# Patient Record
Sex: Female | Born: 1965 | Race: Black or African American | Hispanic: No | Marital: Married | Smoking: Former smoker
Health system: Southern US, Community
[De-identification: ages and names within clinical notes are randomized; demographics above are authoritative.]

## PROBLEM LIST (undated history)

## (undated) DIAGNOSIS — D869 Sarcoidosis, unspecified: Secondary | ICD-10-CM

## (undated) DIAGNOSIS — E119 Type 2 diabetes mellitus without complications: Secondary | ICD-10-CM

## (undated) DIAGNOSIS — I10 Essential (primary) hypertension: Secondary | ICD-10-CM

## (undated) DIAGNOSIS — G4730 Sleep apnea, unspecified: Secondary | ICD-10-CM

## (undated) DIAGNOSIS — K219 Gastro-esophageal reflux disease without esophagitis: Secondary | ICD-10-CM

## (undated) DIAGNOSIS — M1990 Unspecified osteoarthritis, unspecified site: Secondary | ICD-10-CM

## (undated) HISTORY — PX: CHOLECYSTECTOMY: SHX55

## (undated) HISTORY — PX: ORIF ANKLE FRACTURE: SUR919

---
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
## 1991-12-27 HISTORY — PX: ABLATION: SHX5711

## 1999-06-19 ENCOUNTER — Ambulatory Visit (HOSPITAL_COMMUNITY): Admission: RE | Admit: 1999-06-19 | Discharge: 1999-06-19 | Payer: Self-pay | Admitting: Internal Medicine

## 1999-06-19 ENCOUNTER — Encounter: Payer: Self-pay | Admitting: Internal Medicine

## 2001-04-16 ENCOUNTER — Other Ambulatory Visit: Admission: RE | Admit: 2001-04-16 | Discharge: 2001-04-16 | Payer: Self-pay | Admitting: Internal Medicine

## 2001-04-18 ENCOUNTER — Encounter: Payer: Self-pay | Admitting: Internal Medicine

## 2001-04-18 ENCOUNTER — Ambulatory Visit (HOSPITAL_COMMUNITY): Admission: RE | Admit: 2001-04-18 | Discharge: 2001-04-18 | Payer: Self-pay | Admitting: Internal Medicine

## 2001-05-28 ENCOUNTER — Encounter: Payer: Self-pay | Admitting: Emergency Medicine

## 2001-05-29 ENCOUNTER — Encounter: Payer: Self-pay | Admitting: Emergency Medicine

## 2001-05-29 ENCOUNTER — Emergency Department (HOSPITAL_COMMUNITY): Admission: EM | Admit: 2001-05-29 | Discharge: 2001-05-29 | Payer: Self-pay | Admitting: Emergency Medicine

## 2001-08-02 ENCOUNTER — Ambulatory Visit (HOSPITAL_COMMUNITY): Admission: RE | Admit: 2001-08-02 | Discharge: 2001-08-02 | Payer: Self-pay | Admitting: Pulmonary Disease

## 2001-08-10 ENCOUNTER — Ambulatory Visit (HOSPITAL_COMMUNITY): Admission: RE | Admit: 2001-08-10 | Discharge: 2001-08-10 | Payer: Self-pay | Admitting: General Surgery

## 2002-05-04 ENCOUNTER — Emergency Department (HOSPITAL_COMMUNITY): Admission: EM | Admit: 2002-05-04 | Discharge: 2002-05-05 | Payer: Self-pay | Admitting: *Deleted

## 2002-05-13 ENCOUNTER — Ambulatory Visit (HOSPITAL_COMMUNITY): Admission: RE | Admit: 2002-05-13 | Discharge: 2002-05-13 | Payer: Self-pay | Admitting: Pulmonary Disease

## 2002-06-01 IMAGING — CT CT ABDOMEN W/O CM
1 series · 16 of 32 positions shown, 20 images · non-contrast
Comparison: none

FINDINGS
CLINICAL DATA: RIGHT SIDED FLANK PAIN.
CT ABDOMEN WITHOUT CONTRAST
MULTIDETECTOR HELICAL CT IS PERFORMED THROUGH THE ABDOMEN AND PELVIS WITHOUT ORAL OR IV CONTRAST.
THE PATIENT IS STATUS POST CHOLECYSTECTOMY.  VISUALIZED LIVER, SPLEEN AND PANCREAS ARE UNREMARKABLE
ON THIS UNINFUSED SCAN.  ADRENAL GLANDS AND KIDNEYS ARE UNREMARKABLE.  NO EVIDENCE OF
HYDRONEPHROSIS OR STONE.  THE APPENDIX IS VISUALIZED, AIR-FILLED, AND IS NORMAL.  THERE IS AN
UMBILICAL HERNIA CONTAINING FAT NOTED.
IMPRESSION
1.  NO ACUTE ABNORMALITY IN THE ABDOMEN.  NO EVIDENCE OF HYDRONEPHROSIS OR RENAL/URETERAL STONE.
2.  STATUS POST CHOLECYSTECTOMY.
CT PELVIS WITHOUT CONTRAST
THE RIGHT OVARY IS PROMINENT, POSSIBLY CONTAINING A CYST.  THE UTERUS AND LEFT ADNEXA ARE
UNREMARKABLE.  NO FREE FLUID, FREE AIR OR ADENOPATHY.  NO EVIDENCE OF URETERAL STONE OR DILATATION.
PROMINENT RIGHT OVARY, POSSIBLY CONTAINING A CYST.  NO EVIDENCE OF URETERAL CALCULI.

[Series 4369: — · axial · 0.98mm/px · z∈[+1508,+1858]mm · 16 of 79 slices shown, 20 images]
[im 6/79  soft-tissue]
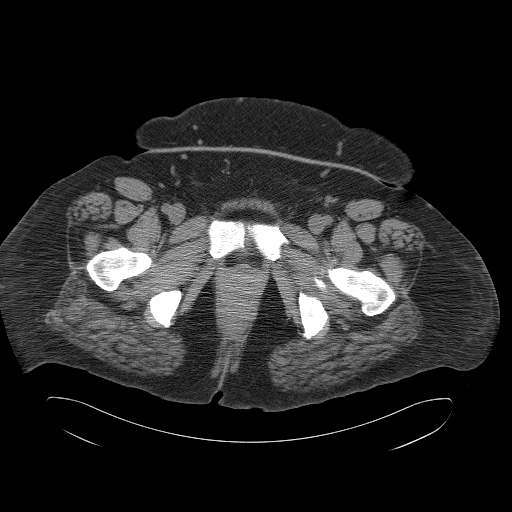
[im 6/79  bone]
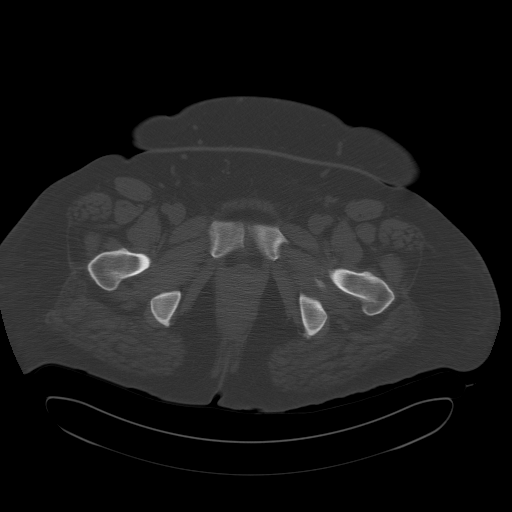
[im 11/79  soft-tissue]
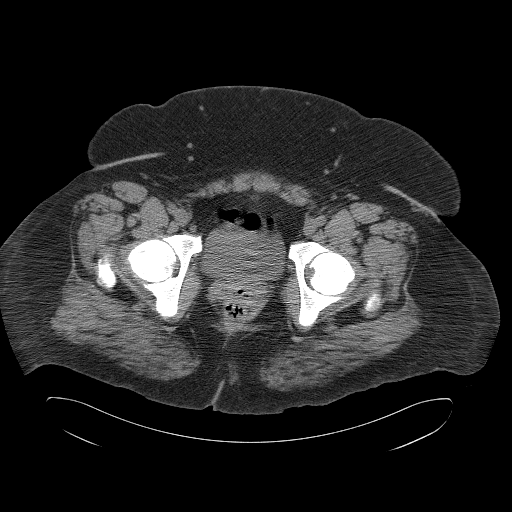
[im 16/79  soft-tissue]
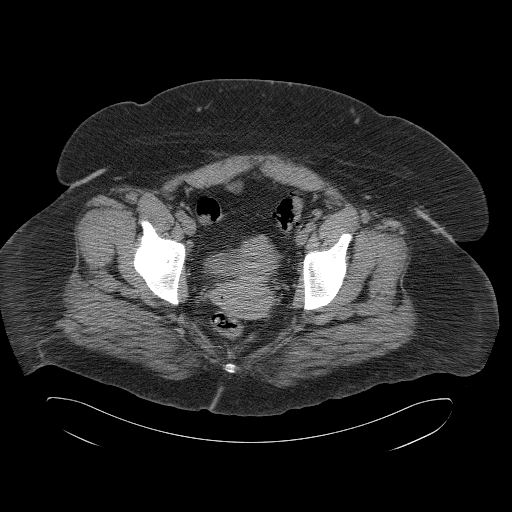
[im 21/79  soft-tissue]
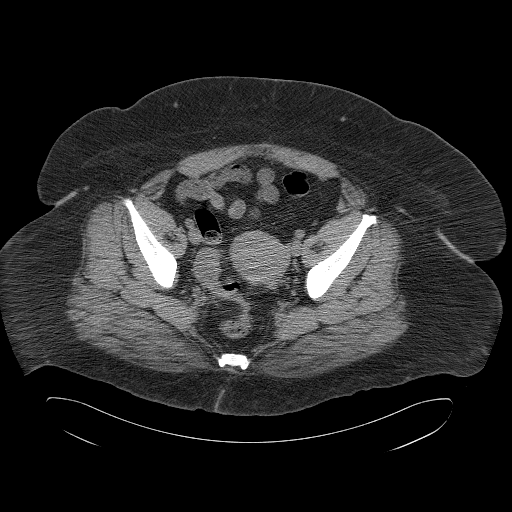
[im 26/79  soft-tissue]
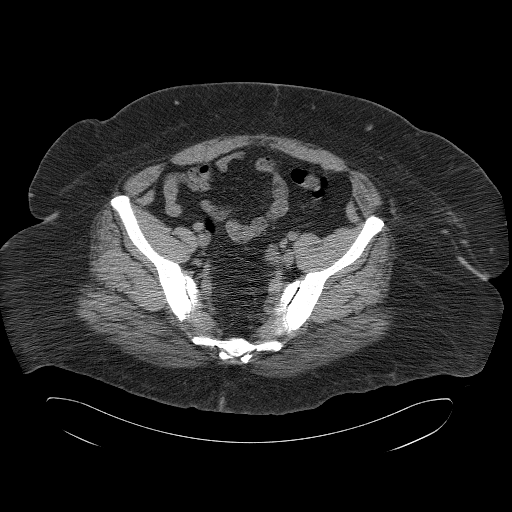
[im 31/79  soft-tissue]
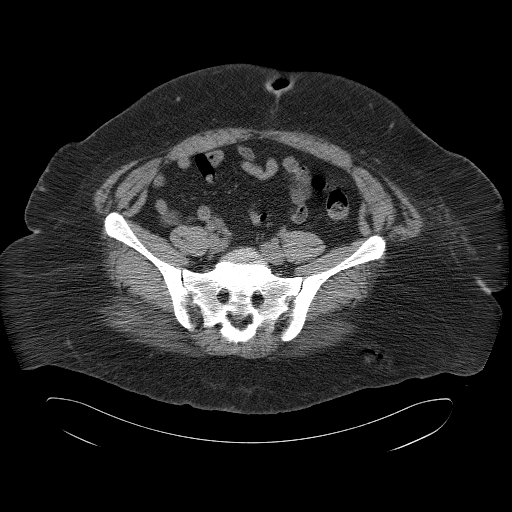
[im 36/79  soft-tissue]
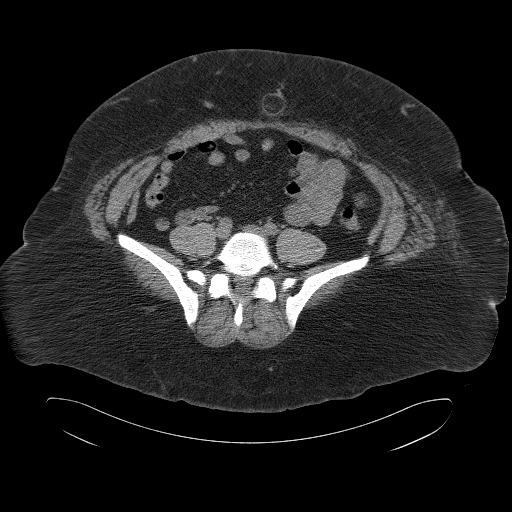
[im 43/79  soft-tissue]
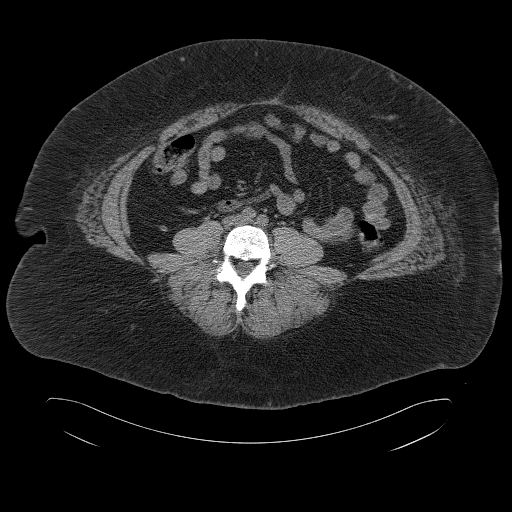
[im 48/79  soft-tissue]
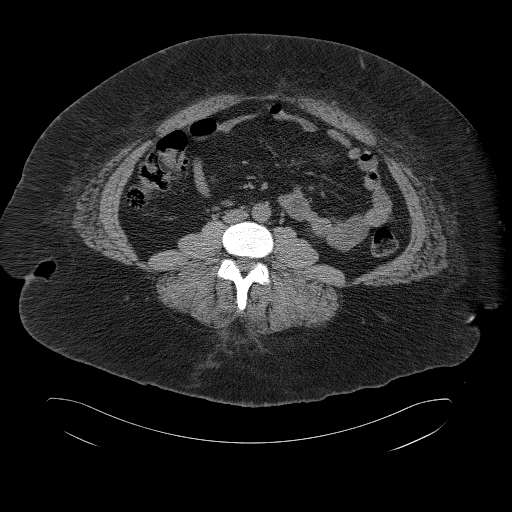
[im 48/79  bone]
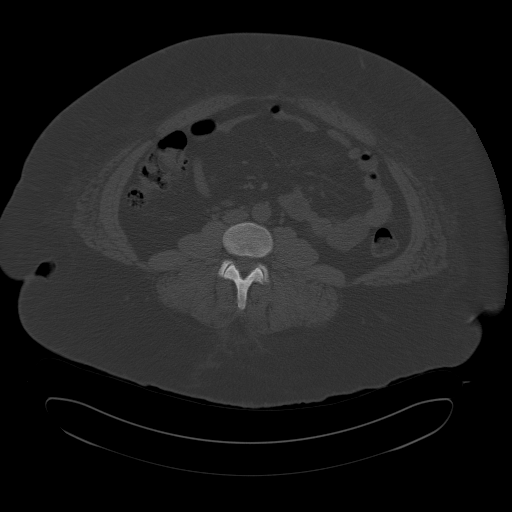
[im 53/79  soft-tissue]
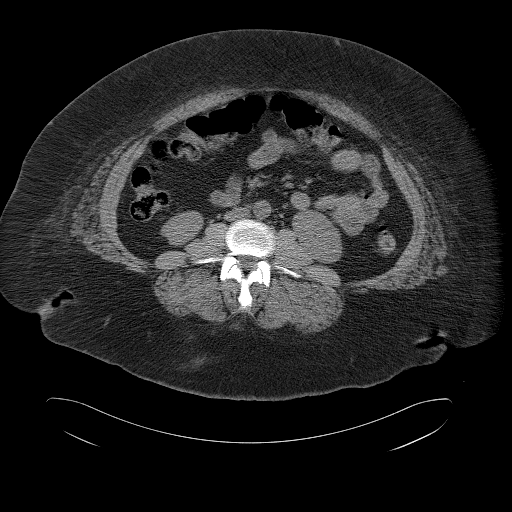
[im 58/79  soft-tissue]
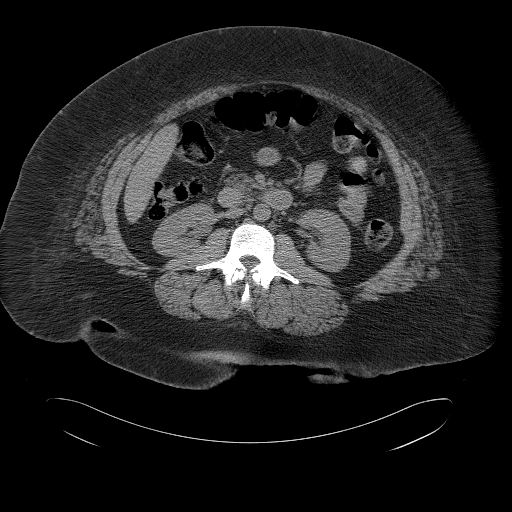
[im 63/79  soft-tissue]
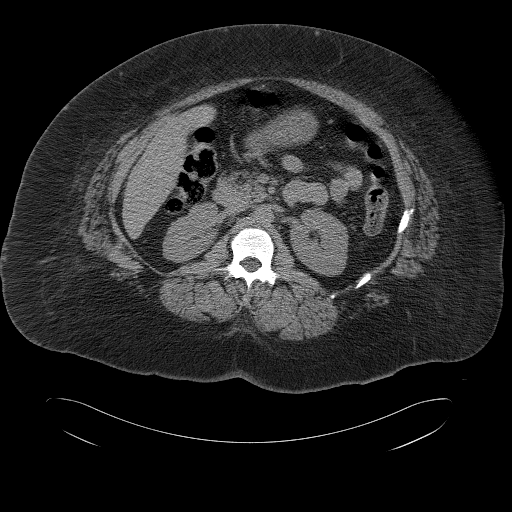
[im 68/79  soft-tissue]
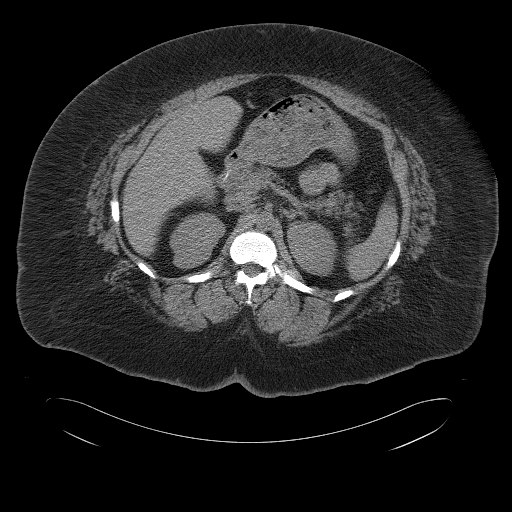
[im 68/79  lung]
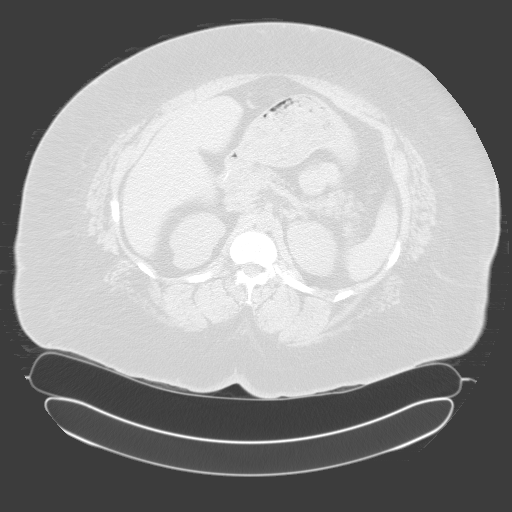
[im 71/79  lung]
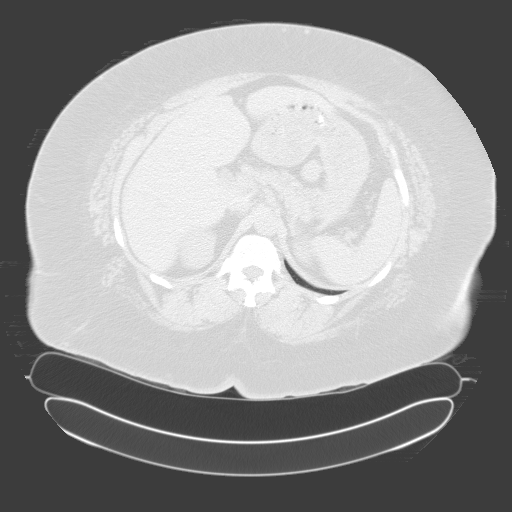
[im 73/79  soft-tissue]
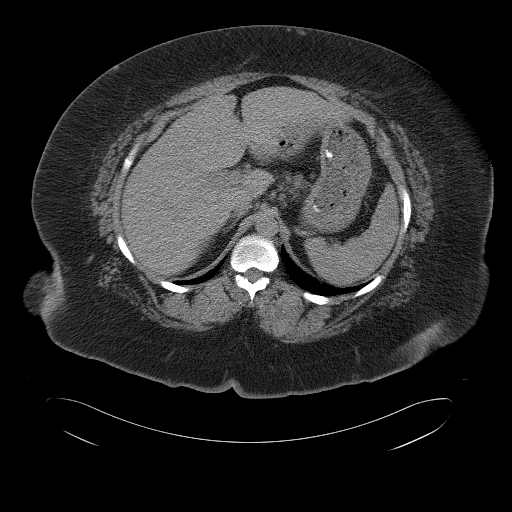
[im 73/79  lung]
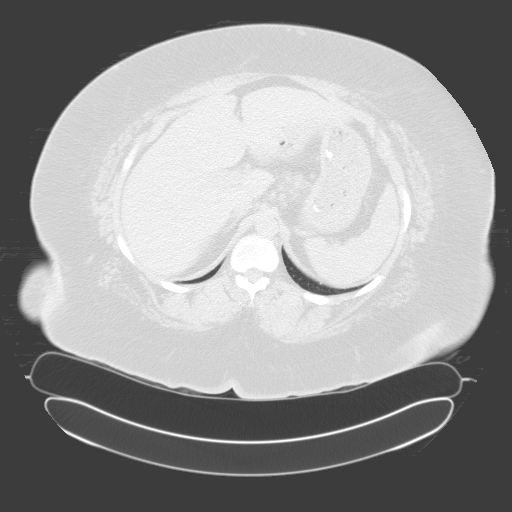
[im 76/79  lung]
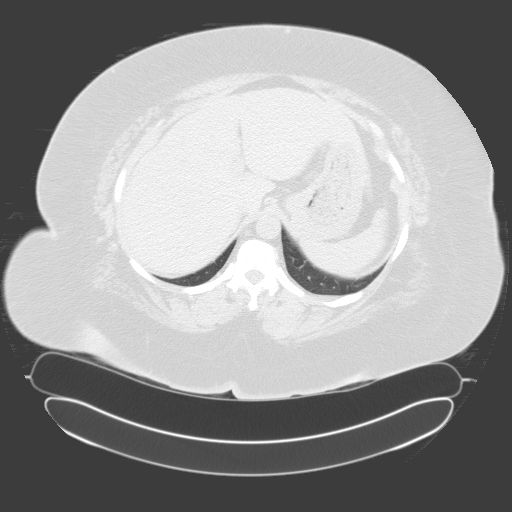

[16 of 32 positions shown; findings below may reference images not displayed]

## 2002-06-20 ENCOUNTER — Ambulatory Visit (HOSPITAL_COMMUNITY): Admission: RE | Admit: 2002-06-20 | Discharge: 2002-06-20 | Payer: Self-pay | Admitting: Internal Medicine

## 2002-06-20 ENCOUNTER — Encounter (INDEPENDENT_AMBULATORY_CARE_PROVIDER_SITE_OTHER): Payer: Self-pay | Admitting: Internal Medicine

## 2003-04-03 ENCOUNTER — Ambulatory Visit (HOSPITAL_COMMUNITY): Admission: RE | Admit: 2003-04-03 | Discharge: 2003-04-03 | Payer: Self-pay | Admitting: Pulmonary Disease

## 2004-06-24 ENCOUNTER — Ambulatory Visit (HOSPITAL_COMMUNITY): Admission: RE | Admit: 2004-06-24 | Discharge: 2004-06-24 | Payer: Self-pay | Admitting: Obstetrics & Gynecology

## 2004-06-24 ENCOUNTER — Encounter (INDEPENDENT_AMBULATORY_CARE_PROVIDER_SITE_OTHER): Payer: Self-pay | Admitting: Internal Medicine

## 2004-07-26 ENCOUNTER — Emergency Department (HOSPITAL_COMMUNITY): Admission: AC | Admit: 2004-07-26 | Discharge: 2004-07-26 | Payer: Self-pay

## 2005-05-24 ENCOUNTER — Ambulatory Visit (HOSPITAL_COMMUNITY): Admission: RE | Admit: 2005-05-24 | Discharge: 2005-05-24 | Payer: Self-pay | Admitting: Pulmonary Disease

## 2005-09-06 ENCOUNTER — Ambulatory Visit: Payer: Self-pay | Admitting: Family Medicine

## 2005-09-07 LAB — CONVERTED CEMR LAB
LDL Cholesterol: 121 mg/dL
TSH: 2.611 microintl units/mL

## 2005-09-08 ENCOUNTER — Encounter (INDEPENDENT_AMBULATORY_CARE_PROVIDER_SITE_OTHER): Payer: Self-pay | Admitting: Internal Medicine

## 2005-10-07 ENCOUNTER — Ambulatory Visit: Payer: Self-pay | Admitting: Family Medicine

## 2005-10-10 ENCOUNTER — Ambulatory Visit: Payer: Self-pay | Admitting: Internal Medicine

## 2005-10-14 ENCOUNTER — Encounter (INDEPENDENT_AMBULATORY_CARE_PROVIDER_SITE_OTHER): Payer: Self-pay | Admitting: Internal Medicine

## 2005-10-24 ENCOUNTER — Ambulatory Visit: Admission: RE | Admit: 2005-10-24 | Discharge: 2005-10-24 | Payer: Self-pay | Admitting: Internal Medicine

## 2005-10-24 ENCOUNTER — Encounter (INDEPENDENT_AMBULATORY_CARE_PROVIDER_SITE_OTHER): Payer: Self-pay | Admitting: Internal Medicine

## 2005-10-26 ENCOUNTER — Ambulatory Visit: Payer: Self-pay | Admitting: Pulmonary Disease

## 2005-11-03 ENCOUNTER — Encounter (INDEPENDENT_AMBULATORY_CARE_PROVIDER_SITE_OTHER): Payer: Self-pay | Admitting: Internal Medicine

## 2005-11-13 ENCOUNTER — Encounter (INDEPENDENT_AMBULATORY_CARE_PROVIDER_SITE_OTHER): Payer: Self-pay | Admitting: Internal Medicine

## 2005-11-24 ENCOUNTER — Ambulatory Visit: Payer: Self-pay | Admitting: Internal Medicine

## 2006-04-12 ENCOUNTER — Ambulatory Visit: Payer: Self-pay | Admitting: Internal Medicine

## 2006-04-14 ENCOUNTER — Ambulatory Visit (HOSPITAL_COMMUNITY): Admission: RE | Admit: 2006-04-14 | Discharge: 2006-04-14 | Payer: Self-pay | Admitting: Internal Medicine

## 2006-04-14 ENCOUNTER — Encounter (INDEPENDENT_AMBULATORY_CARE_PROVIDER_SITE_OTHER): Payer: Self-pay | Admitting: Internal Medicine

## 2006-04-20 ENCOUNTER — Telehealth (INDEPENDENT_AMBULATORY_CARE_PROVIDER_SITE_OTHER): Payer: Self-pay | Admitting: Internal Medicine

## 2006-05-03 ENCOUNTER — Ambulatory Visit: Payer: Self-pay | Admitting: Orthopedic Surgery

## 2006-05-05 ENCOUNTER — Encounter (INDEPENDENT_AMBULATORY_CARE_PROVIDER_SITE_OTHER): Payer: Self-pay | Admitting: Internal Medicine

## 2006-05-15 ENCOUNTER — Ambulatory Visit: Payer: Self-pay | Admitting: Internal Medicine

## 2006-05-15 ENCOUNTER — Other Ambulatory Visit: Admission: RE | Admit: 2006-05-15 | Discharge: 2006-05-15 | Payer: Self-pay | Admitting: Internal Medicine

## 2006-05-15 LAB — CONVERTED CEMR LAB: Pap Smear: NORMAL

## 2006-05-23 ENCOUNTER — Encounter (INDEPENDENT_AMBULATORY_CARE_PROVIDER_SITE_OTHER): Payer: Self-pay | Admitting: Internal Medicine

## 2006-06-12 ENCOUNTER — Ambulatory Visit: Payer: Self-pay | Admitting: Internal Medicine

## 2006-06-15 ENCOUNTER — Encounter (INDEPENDENT_AMBULATORY_CARE_PROVIDER_SITE_OTHER): Payer: Self-pay | Admitting: Internal Medicine

## 2006-06-15 ENCOUNTER — Ambulatory Visit (HOSPITAL_COMMUNITY): Admission: RE | Admit: 2006-06-15 | Discharge: 2006-06-15 | Payer: Self-pay | Admitting: Internal Medicine

## 2006-06-16 ENCOUNTER — Encounter (INDEPENDENT_AMBULATORY_CARE_PROVIDER_SITE_OTHER): Payer: Self-pay | Admitting: Internal Medicine

## 2006-06-23 ENCOUNTER — Encounter (INDEPENDENT_AMBULATORY_CARE_PROVIDER_SITE_OTHER): Payer: Self-pay | Admitting: Internal Medicine

## 2006-08-25 ENCOUNTER — Telehealth (INDEPENDENT_AMBULATORY_CARE_PROVIDER_SITE_OTHER): Payer: Self-pay | Admitting: Internal Medicine

## 2006-10-19 ENCOUNTER — Encounter (INDEPENDENT_AMBULATORY_CARE_PROVIDER_SITE_OTHER): Payer: Self-pay | Admitting: Internal Medicine

## 2006-10-24 ENCOUNTER — Encounter (INDEPENDENT_AMBULATORY_CARE_PROVIDER_SITE_OTHER): Payer: Self-pay | Admitting: Internal Medicine

## 2006-10-31 ENCOUNTER — Ambulatory Visit: Payer: Self-pay | Admitting: Internal Medicine

## 2006-11-24 ENCOUNTER — Encounter (INDEPENDENT_AMBULATORY_CARE_PROVIDER_SITE_OTHER): Payer: Self-pay | Admitting: Internal Medicine

## 2007-01-03 ENCOUNTER — Ambulatory Visit: Payer: Self-pay | Admitting: Internal Medicine

## 2007-01-03 LAB — CONVERTED CEMR LAB
Ketones, ur: NEGATIVE mg/dL
Protein, ur: 30 mg/dL
Specific Gravity, Urine: >=1.03
Urine Glucose: NEGATIVE mg/dL

## 2007-01-29 ENCOUNTER — Encounter: Payer: Self-pay | Admitting: Internal Medicine

## 2007-01-29 DIAGNOSIS — G4733 Obstructive sleep apnea (adult) (pediatric): Secondary | ICD-10-CM | POA: Insufficient documentation

## 2007-01-29 DIAGNOSIS — M1990 Unspecified osteoarthritis, unspecified site: Secondary | ICD-10-CM | POA: Insufficient documentation

## 2007-01-29 DIAGNOSIS — I10 Essential (primary) hypertension: Secondary | ICD-10-CM | POA: Insufficient documentation

## 2007-01-29 DIAGNOSIS — D869 Sarcoidosis, unspecified: Secondary | ICD-10-CM | POA: Insufficient documentation

## 2007-01-29 DIAGNOSIS — G43909 Migraine, unspecified, not intractable, without status migrainosus: Secondary | ICD-10-CM | POA: Insufficient documentation

## 2007-04-05 ENCOUNTER — Ambulatory Visit (HOSPITAL_COMMUNITY): Admission: RE | Admit: 2007-04-05 | Discharge: 2007-04-05 | Payer: Self-pay | Admitting: Internal Medicine

## 2007-04-05 ENCOUNTER — Ambulatory Visit: Payer: Self-pay | Admitting: Internal Medicine

## 2007-04-05 DIAGNOSIS — J029 Acute pharyngitis, unspecified: Secondary | ICD-10-CM | POA: Insufficient documentation

## 2007-04-05 DIAGNOSIS — D7289 Other specified disorders of white blood cells: Secondary | ICD-10-CM | POA: Insufficient documentation

## 2007-04-05 LAB — CONVERTED CEMR LAB
Basophils Absolute: 0 10*3/uL (ref 0.0–0.1)
Creatinine, Ser: 0.86 mg/dL (ref 0.40–1.20)
Lymphs Abs: 2.1 10*3/uL (ref 0.7–3.3)
MCHC: 33.8 g/dL (ref 30.0–36.0)
MCV: 80.1 fL (ref 78.0–100.0)
Monocytes Absolute: 1.2 10*3/uL — ABNORMAL HIGH (ref 0.2–0.7)
Monocytes Relative: 7 % (ref 3–11)
Neutrophils Relative %: 80 % — ABNORMAL HIGH (ref 43–77)
RDW: 15.5 % — ABNORMAL HIGH (ref 11.5–14.0)
WBC: 17.2 10*3/uL — ABNORMAL HIGH (ref 4.0–10.5)

## 2007-04-06 ENCOUNTER — Encounter (INDEPENDENT_AMBULATORY_CARE_PROVIDER_SITE_OTHER): Payer: Self-pay | Admitting: Internal Medicine

## 2007-10-05 ENCOUNTER — Ambulatory Visit: Payer: Self-pay | Admitting: Internal Medicine

## 2007-10-05 DIAGNOSIS — M79609 Pain in unspecified limb: Secondary | ICD-10-CM | POA: Insufficient documentation

## 2007-10-08 LAB — CONVERTED CEMR LAB
BUN: 15 mg/dL (ref 6–23)
CO2: 23 meq/L (ref 19–32)
Calcium: 9.4 mg/dL (ref 8.4–10.5)
Cholesterol: 176 mg/dL (ref 0–200)
Glucose, Bld: 119 mg/dL — ABNORMAL HIGH (ref 70–99)
Sodium: 141 meq/L (ref 135–145)
Total CHOL/HDL Ratio: 4
VLDL: 36 mg/dL (ref 0–40)

## 2007-11-28 ENCOUNTER — Telehealth (INDEPENDENT_AMBULATORY_CARE_PROVIDER_SITE_OTHER): Payer: Self-pay | Admitting: *Deleted

## 2007-12-27 ENCOUNTER — Encounter: Payer: Self-pay | Admitting: Family Medicine

## 2008-01-18 ENCOUNTER — Ambulatory Visit: Payer: Self-pay | Admitting: Internal Medicine

## 2008-04-23 ENCOUNTER — Ambulatory Visit (HOSPITAL_COMMUNITY): Admission: RE | Admit: 2008-04-23 | Discharge: 2008-04-23 | Payer: Self-pay | Admitting: Internal Medicine

## 2008-04-23 ENCOUNTER — Ambulatory Visit: Payer: Self-pay | Admitting: Internal Medicine

## 2008-05-15 ENCOUNTER — Ambulatory Visit: Payer: Self-pay | Admitting: Internal Medicine

## 2008-05-15 DIAGNOSIS — J309 Allergic rhinitis, unspecified: Secondary | ICD-10-CM | POA: Insufficient documentation

## 2008-07-07 ENCOUNTER — Telehealth (INDEPENDENT_AMBULATORY_CARE_PROVIDER_SITE_OTHER): Payer: Self-pay | Admitting: Internal Medicine

## 2008-08-13 ENCOUNTER — Ambulatory Visit: Payer: Self-pay | Admitting: Internal Medicine

## 2008-08-13 DIAGNOSIS — F4321 Adjustment disorder with depressed mood: Secondary | ICD-10-CM | POA: Insufficient documentation

## 2008-08-15 ENCOUNTER — Encounter (INDEPENDENT_AMBULATORY_CARE_PROVIDER_SITE_OTHER): Payer: Self-pay | Admitting: Internal Medicine

## 2008-08-21 ENCOUNTER — Ambulatory Visit: Payer: Self-pay | Admitting: Orthopedic Surgery

## 2008-08-21 DIAGNOSIS — M19079 Primary osteoarthritis, unspecified ankle and foot: Secondary | ICD-10-CM | POA: Insufficient documentation

## 2008-12-09 ENCOUNTER — Telehealth (INDEPENDENT_AMBULATORY_CARE_PROVIDER_SITE_OTHER): Payer: Self-pay | Admitting: Internal Medicine

## 2008-12-16 ENCOUNTER — Ambulatory Visit: Payer: Self-pay | Admitting: Internal Medicine

## 2008-12-24 ENCOUNTER — Encounter (INDEPENDENT_AMBULATORY_CARE_PROVIDER_SITE_OTHER): Payer: Self-pay | Admitting: Internal Medicine

## 2009-07-07 ENCOUNTER — Ambulatory Visit: Payer: Self-pay | Admitting: Internal Medicine

## 2009-07-08 ENCOUNTER — Encounter (INDEPENDENT_AMBULATORY_CARE_PROVIDER_SITE_OTHER): Payer: Self-pay | Admitting: Internal Medicine

## 2009-07-08 LAB — CONVERTED CEMR LAB
ALT: 27 units/L (ref 0–35)
AST: 26 units/L (ref 0–37)
Albumin: 4.2 g/dL (ref 3.5–5.2)
Calcium: 9.2 mg/dL (ref 8.4–10.5)
Chloride: 100 meq/L (ref 96–112)
Cholesterol: 192 mg/dL (ref 0–200)
HDL: 49 mg/dL (ref >39)
MCHC: 33.4 g/dL (ref 30.0–36.0)
MCV: 78.5 fL (ref 78.0–100.0)
Monocytes Relative: 6 % (ref 3–12)
Potassium: 4.6 meq/L (ref 3.5–5.3)
Total Bilirubin: 0.4 mg/dL (ref 0.3–1.2)
Total CHOL/HDL Ratio: 3.9
Triglycerides: 177 mg/dL — ABNORMAL HIGH (ref <150)
VLDL: 35 mg/dL (ref 0–40)

## 2009-07-13 ENCOUNTER — Telehealth (INDEPENDENT_AMBULATORY_CARE_PROVIDER_SITE_OTHER): Payer: Self-pay | Admitting: *Deleted

## 2009-07-20 ENCOUNTER — Encounter (INDEPENDENT_AMBULATORY_CARE_PROVIDER_SITE_OTHER): Payer: Self-pay | Admitting: Internal Medicine

## 2009-07-21 ENCOUNTER — Encounter (INDEPENDENT_AMBULATORY_CARE_PROVIDER_SITE_OTHER): Payer: Self-pay | Admitting: Internal Medicine

## 2009-07-22 ENCOUNTER — Ambulatory Visit: Payer: Self-pay | Admitting: Internal Medicine

## 2009-07-22 DIAGNOSIS — IMO0001 Reserved for inherently not codable concepts without codable children: Secondary | ICD-10-CM | POA: Insufficient documentation

## 2009-07-22 DIAGNOSIS — E1165 Type 2 diabetes mellitus with hyperglycemia: Secondary | ICD-10-CM

## 2009-07-22 LAB — CONVERTED CEMR LAB
Blood Glucose, Fasting: 193 mg/dL
Hgb A1c MFr Bld: 8.6 %

## 2009-08-04 ENCOUNTER — Encounter (INDEPENDENT_AMBULATORY_CARE_PROVIDER_SITE_OTHER): Payer: Self-pay | Admitting: Internal Medicine

## 2009-08-07 ENCOUNTER — Encounter (INDEPENDENT_AMBULATORY_CARE_PROVIDER_SITE_OTHER): Payer: Self-pay | Admitting: Internal Medicine

## 2009-08-18 ENCOUNTER — Ambulatory Visit: Payer: Self-pay | Admitting: Internal Medicine

## 2009-08-19 ENCOUNTER — Encounter (INDEPENDENT_AMBULATORY_CARE_PROVIDER_SITE_OTHER): Payer: Self-pay | Admitting: Internal Medicine

## 2009-08-26 ENCOUNTER — Ambulatory Visit: Payer: Self-pay | Admitting: Internal Medicine

## 2009-09-03 ENCOUNTER — Telehealth (INDEPENDENT_AMBULATORY_CARE_PROVIDER_SITE_OTHER): Payer: Self-pay | Admitting: Internal Medicine

## 2009-09-11 ENCOUNTER — Ambulatory Visit: Payer: Self-pay | Admitting: Internal Medicine

## 2009-09-12 ENCOUNTER — Encounter (INDEPENDENT_AMBULATORY_CARE_PROVIDER_SITE_OTHER): Payer: Self-pay | Admitting: Internal Medicine

## 2009-09-14 LAB — CONVERTED CEMR LAB
Glucose, Bld: 159 mg/dL — ABNORMAL HIGH (ref 70–99)
Potassium: 4.3 meq/L (ref 3.5–5.3)

## 2009-11-28 ENCOUNTER — Emergency Department (HOSPITAL_COMMUNITY): Admission: EM | Admit: 2009-11-28 | Discharge: 2009-11-28 | Payer: Self-pay | Admitting: Emergency Medicine

## 2011-01-16 ENCOUNTER — Encounter: Payer: Self-pay | Admitting: Preventative Medicine

## 2011-01-16 ENCOUNTER — Encounter: Payer: Self-pay | Admitting: Family Medicine

## 2011-01-27 NOTE — Letter (Signed)
Summary: rpc chart  rpc chart   Imported By: Curtis Sites 10/11/2010 10:17:11  _____________________________________________________________________  External Attachment:    Type:   Image     Comment:   External Document

## 2011-05-13 NOTE — Procedures (Signed)
Terri Logan, Terri Logan             ACCOUNT NO.:  [id number]   MEDICAL RECORD NO.:  [id number]          PATIENT TYPE:  OUT   LOCATION:  SLEEP LAB                     FACILITY:  [facility]   PHYSICIAN:  Marcelyn Bruins, M.D. [facility] DATE OF BIRTH:  Jan 26, 1966   DATE OF STUDY:  10/24/2005                              NOCTURNAL POLYSOMNOGRAM   REFERRING PHYSICIAN:  Dr. Sandrea Hughs.   DATE OF STUDY:  October 24, 2005.   INDICATION FOR STUDY:  Hypersomnia with sleep apnea.   EPWORTH SCORE:  9.   SLEEP ARCHITECTURE:  The patient had total sleep time of 328 minutes with  very little slow wave sleep and decreased REM. Sleep onset latency was  prolonged at 39 minutes and REM onset was normal. Sleep efficiency was 85%.   RESPIRATORY DATA:  The patient was found to have 201 obstructive events in  the first 131 minutes of sleep. This gave her respiratory disturbance index  of 92 events per hour. Events occurred in all body positions and there was  loud snoring noted. By protocol, the patient was then placed on C-PAP with  nasal pillows and ultimately titrated to 14 cm of pressure. The patient  continued to have significant breakthrough on this pressure; however, the  technician did not increase pressure for fear of leaks with the nasal pillow  setup.   OXYGEN DATA:  The patient had O2 desaturation as low as 80% with her  obstructive events.   CARDIAC DATA:  No clinically significant cardiac arrhythmias.   MOVEMENT/PARASOMNIA:  The patient was found to have small to moderate  numbers of leg jerks with very little sleep disruption.   IMPRESSION/RECOMMENDATIONS:  A very severe obstructive sleep apnea/hypopnea  syndrome with a respiratory disturbance index of 92 events during the first  part of the study prior to C-PAP initiation. The patient was placed on C-PAP  and titrated to a level of 14 cm with persistent significant breakthrough.  At this point in time, I  would recommend initiating C-PAP at a  level of 10 cm, and having the patient  return for formal C-PAP titration at no charge to her because the technical  difficulties with the actual C-PAP titration.                                            ______________________________  Marcelyn Bruins, M.D. [facility]  Diplomate, American Board of Sleep  Medicine     KC/MEDQ  D:  10/26/2005 17:05:39  T:  10/26/2005 23:57:43  Job:  [id number]

## 2011-05-13 NOTE — Op Note (Signed)
[facility]  Patient:    Terri Logan, Terri Logan Visit Number: [id number] MRN: [id number]          Service Type: END Location: DAY Attending Physician:  Jonathon Bellows Dictated by:   Roetta Sessions, M.D. Proc. Date: 06/20/02 Admit Date:  06/20/2002 Discharge Date: 06/20/2002   CC:         Kari Baars, M.D.  Arna Snipe, M.D.   Operative Report  PROCEDURE:  Diagnostic esophagogastroduodenoscopy.  GASTROENTEROLOGIST:  Roetta Sessions, M.D.  INDICATIONS: The patient is a pleasant 45 year old lady with a 2-1/36-month history of right upper quadrant abdominal pain.  EGD is being done to further evaluate her symptoms.  This approach has been discussed with [name]. Potential risks, benefits, and alternatives have been reviewed and questions answered.  She is agreeable.  I feel she is low risk for conscious sedation. Please see my dictated consultation note of June 24 for more information.  PROCEDURE NOTE:  O2 saturation, blood pressure, and pulse for this patient were monitored throughout the entire procedure.  CONSCIOUS SEDATION:  IV Versed and Demerol in incremental doses.  INSTRUMENT:  Olympus video chip gastroscope.  FINDINGS:   Examination of the tubular esophagus revealed no mucosal abnormalities.  The EG junction was easily traversed.  STOMACH:  Gastric cavity was emptied and insufflated well with air.  Thorough examination of gastric mucosa including retroflexed view of the proximal stomach and esophagogastric junction demonstrated no abnormalities.  Pylorus was patent and easily traversed.  DUODENUM: Bulb and second portion appeared normal.  THERAPEUTIC/DIAGNOSTIC MANEUVERS PERFORMED:  None.  The patient tolerated the procedure well and was reacted in endoscopy.  IMPRESSION:  Normal esophagus, stomach, and duodenum through the second portion.  No endoscopic explanation for patient symptoms.  Of note from June 18, 2002, her  CBC was normal.  Sed rate was 15.  Liver functions were also normal.  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  Sphincter of Oddi dysfunction is not excluded at this time.  RECOMMENDATIONS:  One-month course of acid suppression with Nexium 40 mg orally daily empirically and Levbid 1 tablet orally bid.  Will have her come back to see [place] back in the office in four weeks to assess her progress.  We will then make further recommendations depending on how she is doing. Dictated by:   Roetta Sessions, M.D. Attending Physician:  Jonathon Bellows DD:  06/20/02 TD:  06/22/02 Job: [id number]

## 2011-05-13 NOTE — Op Note (Signed)
NAME:  Terri Logan, Terri Logan                       ACCOUNT NO.:  [id number]   MEDICAL RECORD NO.:  [id number]                   PATIENT TYPE:  AMB   LOCATION:  DAY                                  FACILITY:  [facility]   PHYSICIAN:  Lazaro Arms, M.D.                DATE OF BIRTH:  Apr 30, 1966   DATE OF PROCEDURE:  06/24/2004  DATE OF DISCHARGE:                                 OPERATIVE REPORT   PREOPERATIVE DIAGNOSES:  1. Menometrorrhagia.  2. Dysmenorrhea.   POSTOPERATIVE DIAGNOSES:  1. Menometrorrhagia.  2. Dysmenorrhea.   PROCEDURES:  1. Hysteroscopy.  2. Dilation and curettage.  3. Endometrial ablation.   SURGEON:  Lazaro Arms, M.D.   ANESTHESIA:  General endotracheal.   FINDINGS:  The patient had a normal endometrium.  There were no polyps, no  other abnormalities seen.   DESCRIPTION OF OPERATION:  The patient was taken to the operating room,  placed in the supine position where she underwent general endotracheal  anesthesia, prepped and draped in the usual sterile fashion after being  placed in the dorsal lithotomy position.  Speculum was placed, cervix was  grasped.  A paracervical block was placed using a total of 20 mL of 0.5%  Marcaine with 1:200,000 epinephrine.  The cervix was dilated serially to  allow passage of the hysteroscope.  Hysteroscopy found normal endometrial  cavity, no polyps, no abnormalities.  A vigorous uterine curettage was  performed.  A large amount of tissue was returned.  Endometrial ablation was  then performed.  It required 36 mL of D5W to maintain a pressure of  approximately 190-195 mmHg throughout the procedure.  It was heated to 88  degrees C for a total therapy time of 9 minutes, 55 seconds.  The patient  tolerated the procedure well.  She experienced minimal blood loss.  Taken to  the recovery room in good stable condition, all counts were correct.      ___________________________________________        Lazaro Arms, M.D.   LHE/MEDQ  D:  06/24/2004  T:  06/24/2004  Job:  [id number]

## 2011-05-13 NOTE — Procedures (Signed)
NAME:  Terri Logan, Terri Logan NO.:  [id number]   MEDICAL RECORD NO.:  [id number]           PATIENT TYPE:   LOCATION:  SLEEP LAB                      FACILITY:   PHYSICIAN:  Marcelyn Bruins, M.D. [facility]      DATE OF BIRTH:   DATE OF STUDY:                              NOCTURNAL POLYSOMNOGRAM   REFERRING PHYSICIAN:  Dr. Sandrea Hughs.   INDICATION FOR STUDY:  November 13, 2005.   INDICATION FOR STUDY:  Hypersomnia with sleep apnea.   EPWORTH SCORE:  4.   SLEEP ARCHITECTURE:  The patient had a total sleep time of 418 minutes with  adequate slow wave sleep and increased REM. Sleep onset latency was normal  as was REM onset. Sleep efficiency was fairly good at 91%.   RESPIRATORY DATA:  The patient underwent a C-PAP titration for her  previously documented severe obstructive sleep apnea. The patient was placed  on large nasal pillows and ultimately titrated to 9 cm of water pressure  with excellent control of her obstructive events even through REM. However,  the patient did not achieve supine REM.   OXYGEN DATA:  There was minimal O2 desaturation transiently to 87%.   CARDIAC DATA:  Rare PVC was noted, however, no clinically significant  cardiac arrhythmia.   MOVEMENT/PARASOMNIA:  There were 95 leg jerks with one per hour resulting in  arousal or awakening. There was no abnormal behavior noted during the study.   IMPRESSION/RECOMMENDATIONS:  1.  Good control of previously documented obstructive sleep apnea with 9 cm      of water pressure via large nasal pillows.  2.  Moderate numbers of leg jerks with very little sleep disruption. I would      not be concerned about this unless the patient continues to be      symptomatic after appropriate treatment of her obstructive sleep apnea.                                            ______________________________  Marcelyn Bruins, M.D. [facility]  Diplomate, American Board of Sleep  Medicine     KC/MEDQ  D:  11/21/2005 11:48:58  T:   11/21/2005 22:21:35  Job:  [id number]

## 2015-03-05 ENCOUNTER — Ambulatory Visit (INDEPENDENT_AMBULATORY_CARE_PROVIDER_SITE_OTHER): Payer: BLUE CROSS/BLUE SHIELD | Admitting: Orthopedic Surgery

## 2015-03-05 VITALS — BP 155/81 | Ht 67.0 in | Wt 317.0 lb

## 2015-03-05 DIAGNOSIS — M654 Radial styloid tenosynovitis [de Quervain]: Secondary | ICD-10-CM

## 2015-03-05 NOTE — Progress Notes (Signed)
Chief Complaint  Patient presents with  . Wrist Pain    7 months left wrist pain referred by Dr, Margo AyeHall.    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 Patient notices increased pain when she tries to lift something with a left wrist when she's picking things up when she is turning her wrist note she does not complain of numbness or tingling  Review of systems recent weight loss night sweats cough ankle leg edema stiff joints back pain joint pain tingling which is probably related to this elastic brace as it occurs over the metacarpals are not over the fingertips and its dorsal rather than volar  She's had a cholecystectomy she had ankle surgery in 2002  She has diabetes and hypertension as well as arthritis  She is on glipizide amlodipine and spironolactone citalopram meloxicam hydrocodone  No allergies  Family history hypertension cancer  Does not smoke or drink  Vital signs are stable marked obesity she is oriented 3 mood is pleasant she has tenderness over the first extensor compartment of the thumb with painful ulnar deviation wrist extension pronation supination but a stable wrist and thumb no motor deficits skin intact pulses normal no sensory deficits epitrochlear lymph nodes are negative  Impression Encounter Diagnosis  Name Primary?  Tommi Rumps. De Quervain's disease (tenosynovitis) Yes    Recommend right now splint, ice, anti-inflammatories, follow-up 2 months

## 2015-03-05 NOTE — Patient Instructions (Addendum)
De Quervain's Tenosynovitis De Quervain's tenosynovitis involves inflammation of one or two tendon linings (sheaths) or strain of one or two tendons to the thumb: extensor pollicis brevis (EPB), or abductor pollicis longus (APL). This causes pain on the side of the wrist and base of the thumb. Tendon sheaths secrete a fluid that lubricates the tendon, allowing the tendon to move smoothly. When the sheath becomes inflamed, the tendon cannot move freely in the sheath. Both the EPB and APL tendons are important for proper use of the hand. The EPB tendon is important for straightening the thumb. The APL tendon is important for moving the thumb away from the index finger (abducting). The two tendons pass through a small tube (canal) in the wrist, near the base of the thumb. When the tendons become inflamed, pain is usually felt in this area. SYMPTOMS   Pain, tenderness, swelling, warmth, or redness over the base of the thumb and thumb side of the wrist.  Pain that gets worse when straightening the thumb.  Pain that gets worse when moving the thumb away from the index finger, against resistance.  Pain with pinching or gripping.  Locking or catching of the thumb.  Limited motion of the thumb.  Crackling sound (crepitation) when the tendon or thumb is moved or touched.  Fluid-filled cyst in the area of the base of the thumb. CAUSES   Tenosynovitis is often linked with overuse of the wrist.  Tenosynovitis may be caused by repeated injury to the thumb muscle and tendon units, and with repeated motions of the hand and wrist, due to friction of the tendon within the lining (sheath).  Tenosynovitis may also be due to a sudden increase in activity or change in activity. RISK INCREASES WITH:  Sports that involve repeated hand and wrist motions (golf, bowling, tennis, squash, racquetball).  Heavy labor.  Poor physical wrist strength and flexibility.  Failure to warm up properly before practice or  play.  Female gender.  New mothers who hold their baby's head for long periods or lift infants with thumbs in the infant's armpit (axilla). PREVENTION  Warm up and stretch properly before practice or competition.  Allow enough time for rest and recovery between practices and competition.  Maintain appropriate conditioning:  Cardiovascular fitness.  Forearm, wrist, and hand flexibility.  Muscle strength and endurance.  Use proper exercise technique. PROGNOSIS  This condition is usually curable within 6 weeks, if treated properly with non-surgical treatment and resting of the affected area.  RELATED COMPLICATIONS   Longer healing time if not properly treated or if not given enough time to heal.  Chronic inflammation, causing recurring symptoms of tenosynovitis. Permanent pain or restriction of movement.  Risks of surgery: infection, bleeding, injury to nerves (numbness of the thumb), continued pain, incomplete release of the tendon sheath, recurring symptoms, cutting of the tendons, tendons sliding out of position, weakness of the thumb, thumb stiffness. TREATMENT  First, treatment involves the use of medicine and ice, to reduce pain and inflammation. Patients are encouraged to stop or modify activities that aggravate the injury. Stretching and strengthening exercises may be advised. Exercises may be completed at home or with a therapist. You may be fitted with a brace or splint, to limit motion and allow the injury to heal. Your caregiver may also choose to give you a corticosteroid injection, to reduce the pain and inflammation. If non-surgical treatment is not successful, surgery may be needed. Most tenosynovitis surgeries are done as outpatient procedures (you go home the   same day). Surgery may involve local, regional (whole arm), or general anesthesia.  MEDICATION   If pain medicine is needed, nonsteroidal anti-inflammatory medicines (aspirin and ibuprofen), or other minor pain  relievers (acetaminophen), are often advised.  Do not take pain medicine for 7 days before surgery.  Prescription pain relievers are often prescribed only after surgery. Use only as directed and only as much as you need.  Corticosteroid injections may be given if your caregiver thinks they are needed. There is a limited number of times these injections may be given. COLD THERAPY   Cold treatment (icing) should be applied for 10 to 15 minutes every 2 to 3 hours for inflammation and pain, and immediately after activity that aggravates your symptoms. Use ice packs or an ice massage. SEEK MEDICAL CARE IF:   Symptoms get worse or do not improve in 2 to 4 weeks, despite treatment.  You experience pain, numbness, or coldness in the hand.  Blue, gray, or dark color appears in the fingernails.  Any of the following occur after surgery: increased pain, swelling, redness, drainage of fluids, bleeding in the affected area, or signs of infection.  New, unexplained symptoms develop. (Drugs used in treatment may produce side effects.) Document Released: 12/12/2005 Document Revised: 03/05/2012 Document Reviewed: 03/26/2009 ExitCare Patient Information 2015 [employer]. This information is not intended to replace advice given to you by your health care provider. Make sure you discuss any questions you have with your health care provider.   

## 2015-04-30 ENCOUNTER — Ambulatory Visit: Payer: BLUE CROSS/BLUE SHIELD | Admitting: Orthopedic Surgery

## 2015-05-07 ENCOUNTER — Ambulatory Visit (INDEPENDENT_AMBULATORY_CARE_PROVIDER_SITE_OTHER): Payer: BLUE CROSS/BLUE SHIELD | Admitting: Orthopedic Surgery

## 2015-05-07 VITALS — BP 138/77 | Ht 67.0 in | Wt 317.0 lb

## 2015-05-07 DIAGNOSIS — M654 Radial styloid tenosynovitis [de Quervain]: Secondary | ICD-10-CM

## 2015-05-07 NOTE — Progress Notes (Signed)
Patient ID: Terri HummingbirdLora J Scearce, female   DOB: [birth year], 49 y.o.   MRN: [id number] Chief Complaint  Patient presents with  . 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 She was diagnosed with de Quervain's syndrome and treated with bracing and splinting and anti-inflammatories over 2 months. She comes back in complaining of persistent pain  Exam shows tenderness over the first extensor compartment with painful ulnar deviation. Wrist is stable skin is normal pulses and good sensations intact.  Review of systems no new problems under the musculoskeletal system  We did an injection of the first extensor compartment and asked her to come back in 3 weeks. If she's not improved we should discuss surgical options at that point  Verbal consent was obtained positive taken to confirm site with timeout A steroid injection was performed at THE first extensor compartment the left wrist using 1% plain Lidocaine and 40 mg of Depo-Medrol. This was well tolerated.

## 2015-05-28 ENCOUNTER — Ambulatory Visit (INDEPENDENT_AMBULATORY_CARE_PROVIDER_SITE_OTHER): Payer: BLUE CROSS/BLUE SHIELD | Admitting: Orthopedic Surgery

## 2015-05-28 VITALS — BP 147/81 | Ht 67.0 in | Wt 317.0 lb

## 2015-05-28 DIAGNOSIS — M654 Radial styloid tenosynovitis [de Quervain]: Secondary | ICD-10-CM

## 2015-05-28 NOTE — Patient Instructions (Signed)
Continue brace and ice and aleve/ibuprofen

## 2015-05-28 NOTE — Progress Notes (Signed)
Patient ID: Terri HummingbirdLora J Logan, female   DOB: March 15, 1966, 49 y.o.   MRN: [id number] Chief Complaint  Patient presents with  . Follow-up    3 week follow up DQV    Follow-up progress note  Patient has history of de Quervain's syndrome left wrist. She was treated with anti-tippers, splinting, ice, injection. Presents back for reevaluation.  The first injection seemed to help her but not give complete relief she would like another injection.  System review no numbness or tingling in the hand at this point  Exam tenderness over the first extensor compartment with no swelling painful ulnar deviation but normal range of motion. Wrist remain stable extensor tendon is intact skin is normal and there is normal sensation in the thumb wrist and hand  Inject first extensor compartment left wrist  Verbal consent was given Timeout was taken to confirm the site  A steroid injection was performed at first extensor compartment using 1% plain Lidocaine and 40 mg of Depo-Medrol. This was well tolerated.  She will decide on surgery otherwise she'll come back in 4 weeks

## 2015-06-01 ENCOUNTER — Other Ambulatory Visit: Payer: Self-pay | Admitting: *Deleted

## 2015-06-01 ENCOUNTER — Telehealth: Payer: Self-pay | Admitting: Orthopedic Surgery

## 2015-06-01 NOTE — Telephone Encounter (Signed)
Yes if they allow it

## 2015-06-01 NOTE — Telephone Encounter (Signed)
Patient called back and requested 7/15, is this ok?

## 2015-06-01 NOTE — Telephone Encounter (Signed)
Terri NiemannJaime, patient is calling to advise that she has decided to have surgery on her left wrist per discussion with Dr. Romeo AppleHarrison on 05/28/15, she stated she would like to try for the 3rd week in July if possible, please advise?

## 2015-06-01 NOTE — Telephone Encounter (Signed)
Ok to proceed. 

## 2015-06-01 NOTE — Telephone Encounter (Signed)
July 22

## 2015-06-02 ENCOUNTER — Telehealth: Payer: Self-pay | Admitting: Orthopedic Surgery

## 2015-06-02 NOTE — Telephone Encounter (Signed)
Regarding out-patient surgery scheduled 07/10/15 at [facility], planned CPT 25000, contacted insurer, [employer]; per Tresa EndoKelly B, no pre-authorization is required; Reference#[id number].

## 2015-06-03 ENCOUNTER — Encounter: Payer: Self-pay | Admitting: Orthopedic Surgery

## 2015-06-03 NOTE — Telephone Encounter (Signed)
Surgery scheduled, patient aware 

## 2015-06-25 ENCOUNTER — Ambulatory Visit: Payer: BLUE CROSS/BLUE SHIELD | Admitting: Orthopedic Surgery

## 2015-06-25 ENCOUNTER — Encounter: Payer: Self-pay | Admitting: Orthopedic Surgery

## 2015-07-03 NOTE — Patient Instructions (Signed)
Terri Logan  07/03/2015    Your procedure is scheduled on 07/10/15.  Report to [facility] at 06:15 A.M.  Call this number if you have problems the morning of surgery:  [phone]   Remember:  Do not eat food or drink liquids after midnight.  Take these medicines the morning of surgery with A SIP OF WATER: Amlodipine. You may take your Hydrocodone if needed. Take only half of your dose of Toujeo insulin the night before your procedure, if you take it at bedtime.   Do not wear jewelry, make-up or nail polish.  Do not wear lotions, powders, or perfumes.  You may wear deodorant.  Do not shave 48 hours prior to surgery.  Men may shave face and neck.  Do not bring valuables to the hospital.  [facility] is not responsible for any belongings or valuables.  Contacts, dentures or bridgework may not be worn into surgery.  Leave your suitcase in the car.  After surgery it may be brought to your room.  For patients admitted to the hospital, discharge time will be determined by your treatment team.  Patients discharged the day of surgery will not be allowed to drive home.   Special instructions:  Shower using Hibiclens (CHG bath) then night before surgery and the morning of surgery.  Please read over the following fact sheets that you were given. Anesthesia Post-op Instructions   Carpal Tunnel Release Carpal tunnel release is done to relieve the pressure on the nerves and tendons on the bottom side of your wrist.  LET YOUR CAREGIVER KNOW ABOUT:   Allergies to food or medicine.  Medicines taken, including vitamins, herbs, eyedrops, over-the-counter medicines, and creams.  Use of steroids (by mouth or creams).  Previous problems with anesthetics or numbing medicines.  History of bleeding problems or blood clots.  Previous surgery.  Other health problems, including diabetes and kidney problems.  Possibility of pregnancy, if this applies. RISKS AND COMPLICATIONS  Some  problems that may happen after this procedure include:  Infection.  Damage to the nerves, arteries or tendons could occur. This would be very uncommon.  Bleeding. BEFORE THE PROCEDURE   This surgery may be done while you are asleep (general anesthetic) or may be done under a block where only your forearm and the surgical area is numb.  If the surgery is done under a block, the numbness will gradually wear off within several hours after surgery. HOME CARE INSTRUCTIONS   Have a responsible person with you for 24 hours.  Do not drive a car or use public transportation for 24 hours.  Only take over-the-counter or prescription medicines for pain, discomfort, or fever as directed by your caregiver. Take them as directed.  You may put ice on the palm side of the affected wrist.  Put ice in a plastic bag.  Place a towel between your skin and the bag.  Leave the ice on for 20 to 30 minutes, 4 times per day.  If you were given a splint to keep your wrist from bending, use it as directed. It is important to wear the splint at night or as directed. Use the splint for as long as you have pain or numbness in your hand, arm, or wrist. This may take 1 to 2 months.  Keep your hand raised (elevated) above the level of your heart as much as possible. This keeps swelling down and helps with discomfort.  Change bandages (dressings) as directed.  Keep the  wound clean and dry. SEEK MEDICAL CARE IF:   You develop pain not relieved with medications.  You develop numbness of your hand.  You develop bleeding from your surgical site.  You have an oral temperature above 102 F (38.9 C).  You develop redness or swelling of the surgical site.  You develop new, unexplained problems. SEEK IMMEDIATE MEDICAL CARE IF:   You develop a rash.  You have difficulty breathing.  You develop any reaction or side effects to medications given. Document Released: 03/03/2004 Document Revised: 03/05/2012  Document Reviewed: 10/18/2007 [facility] Patient Information 2015 [employer] [place]. This information is not intended to replace advice given to you by your health care provider. Make sure you discuss any questions you have with your health care provider.   PATIENT INSTRUCTIONS POST-ANESTHESIA  IMMEDIATELY FOLLOWING SURGERY:  Do not drive or operate machinery for the first twenty four hours after surgery.  Do not make any important decisions for twenty four hours after surgery or while taking narcotic pain medications or sedatives.  If you develop intractable nausea and vomiting or a severe headache please notify your doctor immediately.  FOLLOW-UP:  Please make an appointment with your surgeon as instructed. You do not need to follow up with anesthesia unless specifically instructed to do so.  WOUND CARE INSTRUCTIONS (if applicable):  Keep a dry clean dressing on the anesthesia/puncture wound site if there is drainage.  Once the wound has quit draining you may leave it open to air.  Generally you should leave the bandage intact for twenty four hours unless there is drainage.  If the epidural site drains for more than 36-48 hours please call the anesthesia department.  QUESTIONS?:  Please feel free to call your physician or the hospital operator if you have any questions, and they will be happy to assist you.

## 2015-07-06 ENCOUNTER — Encounter (HOSPITAL_COMMUNITY)
Admission: RE | Admit: 2015-07-06 | Discharge: 2015-07-06 | Disposition: A | Discharge status: Home / Self Care | Payer: BLUE CROSS/BLUE SHIELD | Source: Ambulatory Visit | Attending: Orthopedic Surgery | Admitting: Orthopedic Surgery

## 2015-07-06 ENCOUNTER — Encounter (HOSPITAL_COMMUNITY): Payer: Self-pay

## 2015-07-06 DIAGNOSIS — Z01818 Encounter for other preprocedural examination: Secondary | ICD-10-CM | POA: Insufficient documentation

## 2015-07-06 DIAGNOSIS — E3451 Complete androgen insensitivity syndrome: Secondary | ICD-10-CM | POA: Diagnosis not present

## 2015-07-06 HISTORY — DX: Unspecified osteoarthritis, unspecified site: M19.90

## 2015-07-06 HISTORY — DX: Sleep apnea, unspecified: G47.30

## 2015-07-06 HISTORY — DX: Type 2 diabetes mellitus without complications: E11.9

## 2015-07-06 HISTORY — DX: Essential (primary) hypertension: I10

## 2015-07-06 LAB — CBC
HCT: 37.7 % (ref 36.0–46.0)
HEMOGLOBIN: 12.2 g/dL (ref 12.0–15.0)
MCH: 27.4 pg (ref 26.0–34.0)
MCHC: 32.4 g/dL (ref 30.0–36.0)
MCV: 84.7 fL (ref 78.0–100.0)
PLATELETS: 377 10*3/uL (ref 150–400)
RBC: 4.45 MIL/uL (ref 3.87–5.11)
RDW: 14.6 % (ref 11.5–15.5)
WBC: 10.2 10*3/uL (ref 4.0–10.5)

## 2015-07-06 LAB — BASIC METABOLIC PANEL
Anion gap: 11 (ref 5–15)
BUN: 21 mg/dL — AB (ref 6–20)
CHLORIDE: 102 mmol/L (ref 101–111)
CO2: 23 mmol/L (ref 22–32)
CREATININE: 0.99 mg/dL (ref 0.44–1.00)
Calcium: 9.3 mg/dL (ref 8.9–10.3)
GFR calc Af Amer: >60 mL/min (ref >60)
GLUCOSE: 244 mg/dL — AB (ref 65–99)
Potassium: 4.4 mmol/L (ref 3.5–5.1)
Sodium: 136 mmol/L (ref 135–145)

## 2015-07-06 LAB — HCG, SERUM, QUALITATIVE: PREG SERUM: NEGATIVE

## 2015-07-06 NOTE — Pre-Procedure Instructions (Signed)
Patient given information to sign up for my chart at home. 

## 2015-07-07 NOTE — H&P (Signed)
Terri Logan is an 49 y.o. female.   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 I treated her with anti-inflammatory's, injection and splinting and her symptoms did not resolve. She has increased pain when she's trying to lift things or pick things up. She has failed conservative treatment and now presents for de Quervain's release left wrist  Past Medical History  Diagnosis Date  . Diabetes mellitus without complication   . Hypertension   . Sleep apnea   . Arthritis     Past Surgical History  Procedure Laterality Date  . Orif ankle fracture Right   . Cholecystectomy      15 years ago [facility]    No family history on file. Social History:  reports that she quit smoking about 21 years ago. Her smoking use included Cigarettes. She has a 2.5 pack-year smoking history. She does not have any smokeless tobacco history on file. She reports that she does not drink alcohol or use illicit drugs.  Allergies: No Known Allergies  No prescriptions prior to admission    Results for orders placed or performed during the hospital encounter of 07/06/15 (from the past 48 hour(s))  CBC     Status: None   Collection Time: 07/06/15  9:30 AM  Result Value Ref Range   WBC 10.2 4.0 - 10.5 K/uL   RBC 4.45 3.87 - 5.11 MIL/uL   Hemoglobin 12.2 12.0 - 15.0 g/dL   HCT 37.7 36.0 - 46.0 %   MCV 84.7 78.0 - 100.0 fL   MCH 27.4 26.0 - 34.0 pg   MCHC 32.4 30.0 - 36.0 g/dL   RDW 14.6 11.5 - 15.5 %   Platelets 377 150 - 400 K/uL  Basic metabolic panel     Status: Abnormal   Collection Time: 07/06/15  9:30 AM  Result Value Ref Range   Sodium 136 135 - 145 mmol/L   Potassium 4.4 3.5 - 5.1 mmol/L   Chloride 102 101 - 111 mmol/L   CO2 23 22 - 32 mmol/L   Glucose, Bld 244 (H) 65 - 99 mg/dL   BUN 21 (H) 6 - 20 mg/dL    Creatinine, Ser 0.99 0.44 - 1.00 mg/dL   Calcium 9.3 8.9 - 10.3 mg/dL   GFR calc non Af Amer >60 >60 mL/min   GFR calc Af Amer >60 >60 mL/min    Comment: (NOTE) The eGFR has been calculated using the CKD EPI equation. This calculation has not been validated in all clinical situations. eGFR's persistently <60 mL/min signify possible Chronic Kidney Disease.    Anion gap 11 5 - 15  hCG, serum, qualitative     Status: None   Collection Time: 07/06/15  9:30 AM  Result Value Ref Range   Preg, Serum NEGATIVE NEGATIVE   No results found.  ROS Night sweats, cough, ankle leg edema, stiff joints, back pain There were no vitals taken for this visit. Physical Exam Gen. appearance moderate obesity Oriented 3 Mood affect normal She has tenderness over the first extensor compartment of the left thumb painful ulnar deviation with positive Finkelstein's maneuver pain for pronation supination but stable wrist. No motor deficits. Skin intact. Pulses normal. No sensory deficits. Normal epitrochlear related lymph nodes.   Assessment/Plan Tennis Must Quervain's syndrome left wrist  Release first extensor compartment  left wrist  Arther Abbott 07/07/2015, 8:22 AM

## 2015-07-10 ENCOUNTER — Ambulatory Visit (HOSPITAL_COMMUNITY)
Admission: RE | Admit: 2015-07-10 | Discharge: 2015-07-10 | Disposition: A | Discharge status: Home / Self Care | Payer: BLUE CROSS/BLUE SHIELD | Source: Ambulatory Visit | Attending: Orthopedic Surgery | Admitting: Orthopedic Surgery

## 2015-07-10 ENCOUNTER — Encounter (HOSPITAL_COMMUNITY): Payer: Self-pay | Admitting: *Deleted

## 2015-07-10 ENCOUNTER — Ambulatory Visit (HOSPITAL_COMMUNITY): Payer: BLUE CROSS/BLUE SHIELD | Admitting: Anesthesiology

## 2015-07-10 ENCOUNTER — Encounter (HOSPITAL_COMMUNITY)
Admission: RE | Disposition: A | Discharge status: Home / Self Care | Payer: Self-pay | Source: Ambulatory Visit | Attending: Orthopedic Surgery

## 2015-07-10 DIAGNOSIS — Z79899 Other long term (current) drug therapy: Secondary | ICD-10-CM | POA: Diagnosis not present

## 2015-07-10 DIAGNOSIS — Z9989 Dependence on other enabling machines and devices: Secondary | ICD-10-CM | POA: Insufficient documentation

## 2015-07-10 DIAGNOSIS — G4730 Sleep apnea, unspecified: Secondary | ICD-10-CM | POA: Insufficient documentation

## 2015-07-10 DIAGNOSIS — M1990 Unspecified osteoarthritis, unspecified site: Secondary | ICD-10-CM | POA: Diagnosis not present

## 2015-07-10 DIAGNOSIS — I10 Essential (primary) hypertension: Secondary | ICD-10-CM | POA: Diagnosis not present

## 2015-07-10 DIAGNOSIS — E119 Type 2 diabetes mellitus without complications: Secondary | ICD-10-CM | POA: Diagnosis not present

## 2015-07-10 DIAGNOSIS — M654 Radial styloid tenosynovitis [de Quervain]: Secondary | ICD-10-CM | POA: Diagnosis not present

## 2015-07-10 DIAGNOSIS — E3451 Complete androgen insensitivity syndrome: Secondary | ICD-10-CM | POA: Insufficient documentation

## 2015-07-10 DIAGNOSIS — Z87891 Personal history of nicotine dependence: Secondary | ICD-10-CM | POA: Insufficient documentation

## 2015-07-10 HISTORY — PX: DORSAL COMPARTMENT RELEASE: SHX5039

## 2015-07-10 LAB — GLUCOSE, CAPILLARY
Glucose-Capillary: 163 mg/dL — ABNORMAL HIGH (ref 65–99)
Glucose-Capillary: 133 mg/dL — ABNORMAL HIGH (ref 65–99)

## 2015-07-10 SURGERY — RELEASE, FIRST DORSAL COMPARTMENT, HAND
Anesthesia: Monitor Anesthesia Care | Site: Wrist | Laterality: Left

## 2015-07-10 MED ORDER — MIDAZOLAM HCL 2 MG/2ML IJ SOLN
INTRAMUSCULAR | Status: AC
  Filled 2015-07-10: qty 2

## 2015-07-10 MED ORDER — FENTANYL CITRATE (PF) 100 MCG/2ML IJ SOLN
INTRAMUSCULAR | Status: DC | PRN
  Administered 2015-07-10: 25 ug via INTRAVENOUS
  Administered 2015-07-10: 100 ug via INTRAVENOUS
  Administered 2015-07-10 (×2): 25 ug via INTRAVENOUS

## 2015-07-10 MED ORDER — HYDROCODONE-ACETAMINOPHEN 10-325 MG PO TABS: 1.0000 | ORAL_TABLET | ORAL | Status: DC | PRN

## 2015-07-10 MED ORDER — PROPOFOL 10 MG/ML IV BOLUS
INTRAVENOUS | Status: AC
  Filled 2015-07-10: qty 20

## 2015-07-10 MED ORDER — BUPIVACAINE HCL (PF) 0.5 % IJ SOLN
INTRAMUSCULAR | Status: AC
Start: 2015-07-10 — End: 2015-07-10
  Filled 2015-07-10: qty 30

## 2015-07-10 MED ORDER — FENTANYL CITRATE (PF) 100 MCG/2ML IJ SOLN
25.0000 ug | INTRAMUSCULAR | Status: AC
  Administered 2015-07-10 (×2): 25 ug via INTRAVENOUS

## 2015-07-10 MED ORDER — LACTATED RINGERS IV SOLN
INTRAVENOUS | Status: DC
  Administered 2015-07-10: 07:00:00 via INTRAVENOUS

## 2015-07-10 MED ORDER — MIDAZOLAM HCL 2 MG/2ML IJ SOLN
1.0000 mg | INTRAMUSCULAR | Status: DC | PRN
Start: 2015-07-10 — End: 2015-07-10
  Administered 2015-07-10 (×2): 2 mg via INTRAVENOUS
  Filled 2015-07-10: qty 2

## 2015-07-10 MED ORDER — ONDANSETRON HCL 4 MG/2ML IJ SOLN
4.0000 mg | Freq: Once | INTRAMUSCULAR | Status: AC
  Administered 2015-07-10: 4 mg via INTRAVENOUS

## 2015-07-10 MED ORDER — SODIUM CHLORIDE 0.9 % IR SOLN
Status: DC | PRN
  Administered 2015-07-10: 1000 mL

## 2015-07-10 MED ORDER — PROPOFOL INFUSION 10 MG/ML OPTIME
INTRAVENOUS | Status: DC | PRN
  Administered 2015-07-10: 35 ug/kg/min via INTRAVENOUS

## 2015-07-10 MED ORDER — LIDOCAINE HCL (PF) 0.5 % IJ SOLN
INTRAMUSCULAR | Status: DC | PRN
  Administered 2015-07-10: 250 mg via INTRAVENOUS

## 2015-07-10 MED ORDER — CHLORHEXIDINE GLUCONATE 4 % EX LIQD: 60.0000 mL | Freq: Once | CUTANEOUS | Status: DC

## 2015-07-10 MED ORDER — BUPIVACAINE HCL (PF) 0.5 % IJ SOLN
INTRAMUSCULAR | Status: DC | PRN
  Administered 2015-07-10: 10 mL

## 2015-07-10 MED ORDER — FENTANYL CITRATE (PF) 100 MCG/2ML IJ SOLN: 25.0000 ug | INTRAMUSCULAR | Status: DC | PRN

## 2015-07-10 MED ORDER — MIDAZOLAM HCL 5 MG/5ML IJ SOLN
INTRAMUSCULAR | Status: DC | PRN
  Administered 2015-07-10 (×2): 1 mg via INTRAVENOUS

## 2015-07-10 MED ORDER — FENTANYL CITRATE (PF) 250 MCG/5ML IJ SOLN
INTRAMUSCULAR | Status: AC
  Filled 2015-07-10: qty 5

## 2015-07-10 MED ORDER — CEFAZOLIN SODIUM 1-5 GM-% IV SOLN
INTRAVENOUS | Status: AC
  Filled 2015-07-10: qty 50

## 2015-07-10 MED ORDER — ONDANSETRON HCL 4 MG/2ML IJ SOLN: 4.0000 mg | Freq: Once | INTRAMUSCULAR | Status: DC | PRN

## 2015-07-10 MED ORDER — SODIUM CHLORIDE 0.9 % IJ SOLN
INTRAMUSCULAR | Status: AC
  Filled 2015-07-10: qty 3

## 2015-07-10 MED ORDER — CEFAZOLIN SODIUM-DEXTROSE 2-3 GM-% IV SOLR
INTRAVENOUS | Status: AC
  Filled 2015-07-10: qty 50

## 2015-07-10 MED ORDER — DEXTROSE 5 % IV SOLN
3.0000 g | INTRAVENOUS | Status: AC
  Administered 2015-07-10: 3 g via INTRAVENOUS
  Filled 2015-07-10: qty 3000

## 2015-07-10 MED ORDER — FENTANYL CITRATE (PF) 100 MCG/2ML IJ SOLN
INTRAMUSCULAR | Status: AC
  Filled 2015-07-10: qty 2

## 2015-07-10 MED ORDER — ONDANSETRON HCL 4 MG/2ML IJ SOLN
INTRAMUSCULAR | Status: AC
  Filled 2015-07-10: qty 2

## 2015-07-10 SURGICAL SUPPLY — 47 items
APL SKNCLS STERI-STRIP NONHPOA (GAUZE/BANDAGES/DRESSINGS) ×1
BAG HAMPER (MISCELLANEOUS) ×2 IMPLANT
BANDAGE ELASTIC 2 VELCRO NS LF (GAUZE/BANDAGES/DRESSINGS) ×1 IMPLANT
BANDAGE ELASTIC 3 VELCRO ST LF (GAUZE/BANDAGES/DRESSINGS) ×2 IMPLANT
BANDAGE ESMARK 4X12 BL STRL LF (DISPOSABLE) ×1 IMPLANT
BANDAGE GAUZE ELAST BULKY 4 IN (GAUZE/BANDAGES/DRESSINGS) ×1 IMPLANT
BENZOIN TINCTURE PRP APPL 2/3 (GAUZE/BANDAGES/DRESSINGS) ×2 IMPLANT
BLADE SURG 15 STRL LF DISP TIS (BLADE) ×1 IMPLANT
BLADE SURG 15 STRL SS (BLADE) ×2
BNDG CMPR 12X4 ELC STRL LF (DISPOSABLE) ×1
BNDG COHESIVE 4X5 TAN STRL (GAUZE/BANDAGES/DRESSINGS) ×2 IMPLANT
BNDG ESMARK 4X12 BLUE STRL LF (DISPOSABLE) ×2
CHLORAPREP W/TINT 26ML (MISCELLANEOUS) ×2 IMPLANT
CLOTH BEACON ORANGE TIMEOUT ST (SAFETY) ×2 IMPLANT
CLSR STERI-STRIP ANTIMIC 1/2X4 (GAUZE/BANDAGES/DRESSINGS) ×2 IMPLANT
COVER LIGHT HANDLE STERIS (MISCELLANEOUS) ×4 IMPLANT
CUFF TOURNIQUET SINGLE 18IN (TOURNIQUET CUFF) ×2 IMPLANT
CUFF TOURNIQUET SINGLE 24IN (TOURNIQUET CUFF) ×1 IMPLANT
DECANTER SPIKE VIAL GLASS SM (MISCELLANEOUS) ×2 IMPLANT
DRAPE PROXIMA HALF (DRAPES) ×2 IMPLANT
DRSG XEROFORM 1X8 (GAUZE/BANDAGES/DRESSINGS) ×1 IMPLANT
ELECT REM PT RETURN 9FT ADLT (ELECTROSURGICAL) ×2
ELECTRODE REM PT RTRN 9FT ADLT (ELECTROSURGICAL) ×1 IMPLANT
GAUZE KERLIX 2X3 DERM STRL LF (GAUZE/BANDAGES/DRESSINGS) ×2 IMPLANT
GAUZE SPONGE 4X4 12PLY STRL (GAUZE/BANDAGES/DRESSINGS) ×1 IMPLANT
GLOVE INDICATOR 7.5 STRL GRN (GLOVE) ×1 IMPLANT
GLOVE SKINSENSE NS SZ8.0 LF (GLOVE) ×1
GLOVE SKINSENSE STRL SZ8.0 LF (GLOVE) ×1 IMPLANT
GLOVE SS N UNI LF 8.5 STRL (GLOVE) ×2 IMPLANT
GLOVE SURG SS PI 7.0 STRL IVOR (GLOVE) ×1 IMPLANT
GOWN STRL REUS W/TWL LRG LVL3 (GOWN DISPOSABLE) ×2 IMPLANT
GOWN STRL REUS W/TWL XL LVL3 (GOWN DISPOSABLE) ×2 IMPLANT
KIT ROOM TURNOVER APOR (KITS) ×2 IMPLANT
LIQUID BAND (GAUZE/BANDAGES/DRESSINGS) ×2 IMPLANT
MANIFOLD NEPTUNE II (INSTRUMENTS) ×2 IMPLANT
NDL HYPO 21X1.5 SAFETY (NEEDLE) ×1 IMPLANT
NEEDLE HYPO 21X1.5 SAFETY (NEEDLE) ×2 IMPLANT
NS IRRIG 1000ML POUR BTL (IV SOLUTION) ×2 IMPLANT
PACK BASIC LIMB (CUSTOM PROCEDURE TRAY) ×2 IMPLANT
PAD ARMBOARD 7.5X6 YLW CONV (MISCELLANEOUS) ×1 IMPLANT
SET BASIN LINEN APH (SET/KITS/TRAYS/PACK) ×2 IMPLANT
SPONGE GAUZE 4X4 12PLY (GAUZE/BANDAGES/DRESSINGS) ×1 IMPLANT
SUT ETHILON 3 0 FSL (SUTURE) ×1 IMPLANT
SUT MON AB 2-0 SH 27 (SUTURE) ×2
SUT MON AB 2-0 SH27 (SUTURE) ×1 IMPLANT
SYR CONTROL 10ML LL (SYRINGE) ×1 IMPLANT
SYRINGE 10CC LL (SYRINGE) ×1 IMPLANT

## 2015-07-10 NOTE — Op Note (Signed)
07/10/2015  8:27 AM  PATIENT:  Terri Logan  [age] y.o. female  PRE-OPERATIVE DIAGNOSIS:  dequervain syndrome left  POST-OPERATIVE DIAGNOSIS:  dequervain syndrome left  PROCEDURE:  Procedure(s): RELEASE DORSAL COMPARTMENT (DEQUERVAIN) (Left)   Operative findings stenosis of the first extensor compartment with multiple slips of the tendons of the first extensor compartment with separate compartment for the extensor pollicis brevis and abductor pollicis longus  The surgery was done as follows I evaluated the patient the preop area and cleared her for surgery. I reviewed and updated the chart. Marked the surgical site. She was taken to the operating room given 3 g of Ancef because of her weight of 332 pounds. She had a Bier block and her arm was prepped and draped sterilely and we completed the timeout procedure  Made a longitudinal incision through the skin and then bluntly dissected down to the first extensor compartment. Care was taken not to injure the nerve crossing the surgical site. We incised the sheath of the first extensor compartment and noticed multiple slips and then carefully released each slip separately  We irrigated the wound and closed with a running 2-0 Monocryl suture and applied skin seal over the top we injected 10 mL of Marcaine and then applied a sterile dressing  We released the tourniquet and the fingers were viable with good color and capillary refill. The patient was taken to recovery room in stable condition  SURGEON:  Surgeon(s) and Role:    * Vickki HearingStanley E Harrison, MD - Primary  PHYSICIAN ASSISTANT:   ASSISTANTS: none   ANESTHESIA:   regional  EBL:  Total I/O In: 300 [I.V.:300] Out: -   BLOOD ADMINISTERED:none  DRAINS: none   LOCAL MEDICATIONS USED:  MARCAINE     SPECIMEN:  No Specimen  DISPOSITION OF SPECIMEN:  N/A  COUNTS:  YES  TOURNIQUET:   Total Tourniquet Time Documented: Upper Arm (Left) - 37 minutes Total: Upper Arm (Left) - 37  minutes   DICTATION: .Terri Logan Dictation  PLAN OF CARE: Discharge to home after PACU  PATIENT DISPOSITION:  PACU - hemodynamically stable.   Delay start of Pharmacological VTE agent (>24hrs) due to surgical blood loss or risk of bleeding: not applicable

## 2015-07-10 NOTE — Anesthesia Preprocedure Evaluation (Signed)
Anesthesia Evaluation  Patient identified by MRN, date of birth, ID band Patient awake    Reviewed: Allergy & Precautions, NPO status , Patient's Chart, lab work & pertinent test results  Airway Mallampati: II  TM Distance: >3 FB     Dental  (+) Teeth Intact   Pulmonary sleep apnea and Continuous Positive Airway Pressure Ventilation , former smoker,  breath sounds clear to auscultation        Cardiovascular hypertension, Pt. on medications Rhythm:Regular Rate:Normal     Neuro/Psych  Headaches, PSYCHIATRIC DISORDERS    GI/Hepatic negative GI ROS,   Endo/Other  diabetes, Type 2, Oral Hypoglycemic AgentsMorbid obesity  Renal/GU      Musculoskeletal  (+) Arthritis -,   Abdominal   Peds  Hematology   Anesthesia Other Findings   Reproductive/Obstetrics                             Anesthesia Physical Anesthesia Plan  ASA: III  Anesthesia Plan: MAC and Bier Block   Post-op Pain Management:    Induction: Intravenous  Airway Management Planned: Simple Face Mask  Additional Equipment:   Intra-op Plan:   Post-operative Plan:   Informed Consent: I have reviewed the patients History and Physical, chart, labs and discussed the procedure including the risks, benefits and alternatives for the proposed anesthesia with the patient or authorized representative who has indicated his/her understanding and acceptance.     Plan Discussed with:   Anesthesia Plan Comments:         Anesthesia Quick Evaluation

## 2015-07-10 NOTE — Addendum Note (Signed)
Addendum  created 07/10/15 0857 by Despina Hiddenobert J Gladyes Kudo, CRNA   Modules edited: Anesthesia Medication Administration

## 2015-07-10 NOTE — Anesthesia Procedure Notes (Signed)
Anesthesia Regional Block:  Bier block (IV Regional)  Pre-Anesthetic Checklist: ,, timeout performed, Correct Patient, Correct Site, Correct Laterality, Correct Procedure, Correct Position, site marked, Risks and benefits discussed, Surgical consent, Pre-op evaluation  Laterality: Left  Prep: Betadine       Needles:  Injection technique: Single-shot      Needle Gauge: 22 and 22 G    Additional Needles: Bier block (IV Regional) Narrative:  Start time: 07/10/2015 7:47 AM

## 2015-07-10 NOTE — Brief Op Note (Signed)
07/10/2015  8:27 AM  PATIENT:  Terri Logan  48 y.o. female  PRE-OPERATIVE DIAGNOSIS:  dequervain syndrome left  POST-OPERATIVE DIAGNOSIS:  dequervain syndrome left  PROCEDURE:  Procedure(s): RELEASE DORSAL COMPARTMENT (DEQUERVAIN) (Left)   Operative findings stenosis of the first extensor compartment with multiple slips of the tendons of the first extensor compartment with separate compartment for the extensor pollicis brevis and abductor pollicis longus  The surgery was done as follows I evaluated the patient the preop area and cleared her for surgery. I reviewed and updated the chart. Marked the surgical site. She was taken to the operating room given 3 g of Ancef because of her weight of 332 pounds. She had a Bier block and her arm was prepped and draped sterilely and we completed the timeout procedure  Made a longitudinal incision through the skin and then bluntly dissected down to the first extensor compartment. Care was taken not to injure the nerve crossing the surgical site. We incised the sheath of the first extensor compartment and noticed multiple slips and then carefully released each slip separately  We irrigated the wound and closed with a running 2-0 Monocryl suture and applied skin seal over the top we injected 10 mL of Marcaine and then applied a sterile dressing  We released the tourniquet and the fingers were viable with good color and capillary refill. The patient was taken to recovery room in stable condition  SURGEON:  Surgeon(s) and Role:    * Stanley E Harrison, MD - Primary  PHYSICIAN ASSISTANT:   ASSISTANTS: none   ANESTHESIA:   regional  EBL:  Total I/O In: 300 [I.V.:300] Out: -   BLOOD ADMINISTERED:none  DRAINS: none   LOCAL MEDICATIONS USED:  MARCAINE     SPECIMEN:  No Specimen  DISPOSITION OF SPECIMEN:  N/A  COUNTS:  YES  TOURNIQUET:   Total Tourniquet Time Documented: Upper Arm (Left) - 37 minutes Total: Upper Arm (Left) - 37  minutes   DICTATION: .Dragon Dictation  PLAN OF CARE: Discharge to home after PACU  PATIENT DISPOSITION:  PACU - hemodynamically stable.   Delay start of Pharmacological VTE agent (>24hrs) due to surgical blood loss or risk of bleeding: not applicable 

## 2015-07-10 NOTE — Anesthesia Postprocedure Evaluation (Signed)
  Anesthesia Post-op Note  Patient: Terri HummingbirdLora J Troung  Procedure(s) Performed: Procedure(s): RELEASE DORSAL COMPARTMENT (DEQUERVAIN) (Left)  Patient Location: PACU  Anesthesia Type:Bier block  Level of Consciousness: awake, alert , oriented and patient cooperative  Airway and Oxygen Therapy: Patient Spontanous Breathing  Post-op Pain: 2 /10, mild  Post-op Assessment: Post-op Vital signs reviewed, Patient's Cardiovascular Status Stable, Respiratory Function Stable, Patent Airway, No signs of Nausea or vomiting and Pain level controlled              Post-op Vital Signs: Reviewed and stable  Last Vitals:  Filed Vitals:   07/10/15 0730  BP: 140/67  Temp:   Resp: 20    Complications: No apparent anesthesia complications

## 2015-07-10 NOTE — Transfer of Care (Signed)
Immediate Anesthesia Transfer of Care Note  Patient: Terri HummingbirdLora J Stanfill  Procedure(s) Performed: Procedure(s): RELEASE DORSAL COMPARTMENT (DEQUERVAIN) (Left)  Patient Location: PACU  Anesthesia Type:Bier block  Level of Consciousness: awake and patient cooperative  Airway & Oxygen Therapy: Patient Spontanous Breathing and Patient connected to face mask oxygen  Post-op Assessment: Report given to RN, Post -op Vital signs reviewed and stable and Patient moving all extremities  Post vital signs: Reviewed and stable  Last Vitals:  Filed Vitals:   07/10/15 0730  BP: 140/67  Temp:   Resp: 20    Complications: No apparent anesthesia complications

## 2015-07-10 NOTE — Interval H&P Note (Signed)
History and Physical Interval Note:  07/10/2015 7:27 AM  Terri HummingbirdLora J Gavigan  has presented today for surgery, with the diagnosis of dequervain syndrome left  The various methods of treatment have been discussed with the patient and family. After consideration of risks, benefits and other options for treatment, the patient has consented to  Procedure(s): RELEASE DORSAL COMPARTMENT (DEQUERVAIN) (Left) as a surgical intervention .  The patient's history has been reviewed, patient examined, no change in status, stable for surgery.  I have reviewed the patient's chart and labs.  Questions were answered to the patient's satisfaction.     Fuller CanadaStanley Asucena Galer

## 2015-07-13 ENCOUNTER — Ambulatory Visit (INDEPENDENT_AMBULATORY_CARE_PROVIDER_SITE_OTHER): Payer: BLUE CROSS/BLUE SHIELD | Admitting: Orthopedic Surgery

## 2015-07-13 ENCOUNTER — Ambulatory Visit: Payer: BLUE CROSS/BLUE SHIELD | Admitting: Orthopedic Surgery

## 2015-07-13 ENCOUNTER — Encounter (HOSPITAL_COMMUNITY): Payer: Self-pay | Admitting: Orthopedic Surgery

## 2015-07-13 VITALS — BP 134/75 | Ht 69.0 in | Wt 332.2 lb

## 2015-07-13 DIAGNOSIS — M654 Radial styloid tenosynovitis [de Quervain]: Secondary | ICD-10-CM

## 2015-07-13 DIAGNOSIS — Z4789 Encounter for other orthopedic aftercare: Secondary | ICD-10-CM

## 2015-07-13 NOTE — Progress Notes (Signed)
Patient ID: Terri Logan, female   DOB: Mar 05, 1966, 49 y.o.   MRN: [id number]  Follow up visit  Chief Complaint  Patient presents with  . Follow-up    post 1, LFT DQV RELEASE, DOS 07/10/15    BP 134/75 mmHg  Ht 5' 9"  (1.753 m)  Wt 332 lb 3.2 oz (150.685 kg)  BMI 49.04 kg/m2  Encounter Diagnoses  Name Primary?  Tennis Must Quervain's disease (tenosynovitis) Yes  . Aftercare following surgery of the musculoskeletal system     The incision is clean and dry   Dressing changed   Return for suture removal 28th  RET TO WORK AUG 1

## 2015-07-21 ENCOUNTER — Ambulatory Visit: Payer: BLUE CROSS/BLUE SHIELD | Admitting: Orthopedic Surgery

## 2015-07-23 ENCOUNTER — Ambulatory Visit (INDEPENDENT_AMBULATORY_CARE_PROVIDER_SITE_OTHER): Payer: BLUE CROSS/BLUE SHIELD | Admitting: Orthopedic Surgery

## 2015-07-23 DIAGNOSIS — M654 Radial styloid tenosynovitis [de Quervain]: Secondary | ICD-10-CM

## 2015-07-23 DIAGNOSIS — Z4789 Encounter for other orthopedic aftercare: Secondary | ICD-10-CM

## 2015-07-23 NOTE — Progress Notes (Signed)
Suture removed from left wrist, patient tolerated well

## 2015-07-27 ENCOUNTER — Encounter: Payer: Self-pay | Admitting: Orthopedic Surgery

## 2015-08-17 ENCOUNTER — Ambulatory Visit (INDEPENDENT_AMBULATORY_CARE_PROVIDER_SITE_OTHER): Payer: BLUE CROSS/BLUE SHIELD | Admitting: Orthopedic Surgery

## 2015-08-17 ENCOUNTER — Encounter: Payer: Self-pay | Admitting: Orthopedic Surgery

## 2015-08-17 VITALS — BP 140/87 | Ht 69.0 in | Wt 332.2 lb

## 2015-08-17 DIAGNOSIS — Z4789 Encounter for other orthopedic aftercare: Secondary | ICD-10-CM

## 2015-08-17 DIAGNOSIS — M654 Radial styloid tenosynovitis [de Quervain]: Secondary | ICD-10-CM

## 2015-08-17 NOTE — Progress Notes (Signed)
Patient ID: Terri Logan, female   DOB: 06-28-1966, 49 y.o.   MRN: [id number]  Follow up visit  Chief Complaint  Patient presents with  . Follow-up    5 week follow up left DQV release, DOS 07/10/15    BP 140/87 mmHg  Ht  (1.753 m)  Wt 332 lb 3.2 oz (150.685 kg)  BMI 49.04 kg/m2  Encounter Diagnoses  Name Primary?  Tommi Rumps Quervain's disease (tenosynovitis) Yes  . Aftercare following surgery of the musculoskeletal system     Patient is for postop visit status post release of left de Quervain's syndrome  Doing well wounds healed. She is having a little bit of discomfort in the thumb which may be related to some scarring around the superficial branch of the radial nerve it is mild is intermittent  She's moving her thumb well no tenderness at the first extensor compartment ulnar deviation is pain free  Recommend follow-up as needed

## 2016-02-18 ENCOUNTER — Other Ambulatory Visit (HOSPITAL_COMMUNITY): Payer: Self-pay | Admitting: Internal Medicine

## 2016-02-18 DIAGNOSIS — Z1231 Encounter for screening mammogram for malignant neoplasm of breast: Secondary | ICD-10-CM

## 2016-02-24 ENCOUNTER — Ambulatory Visit (HOSPITAL_COMMUNITY): Payer: BLUE CROSS/BLUE SHIELD

## 2016-03-07 ENCOUNTER — Ambulatory Visit (HOSPITAL_COMMUNITY)
Admission: RE | Admit: 2016-03-07 | Discharge: 2016-03-07 | Disposition: A | Discharge status: Home / Self Care | Payer: BLUE CROSS/BLUE SHIELD | Source: Ambulatory Visit | Attending: Internal Medicine | Admitting: Internal Medicine

## 2016-03-07 DIAGNOSIS — Z1231 Encounter for screening mammogram for malignant neoplasm of breast: Secondary | ICD-10-CM | POA: Diagnosis not present

## 2016-06-13 DIAGNOSIS — E782 Mixed hyperlipidemia: Secondary | ICD-10-CM | POA: Diagnosis not present

## 2016-06-13 DIAGNOSIS — E119 Type 2 diabetes mellitus without complications: Secondary | ICD-10-CM | POA: Diagnosis not present

## 2016-06-15 DIAGNOSIS — G894 Chronic pain syndrome: Secondary | ICD-10-CM | POA: Diagnosis not present

## 2016-06-15 DIAGNOSIS — E1165 Type 2 diabetes mellitus with hyperglycemia: Secondary | ICD-10-CM | POA: Diagnosis not present

## 2016-06-15 DIAGNOSIS — E782 Mixed hyperlipidemia: Secondary | ICD-10-CM | POA: Diagnosis not present

## 2016-06-15 DIAGNOSIS — M25561 Pain in right knee: Secondary | ICD-10-CM | POA: Diagnosis not present

## 2016-08-18 ENCOUNTER — Encounter: Payer: Self-pay | Admitting: Orthopedic Surgery

## 2016-09-20 ENCOUNTER — Ambulatory Visit (INDEPENDENT_AMBULATORY_CARE_PROVIDER_SITE_OTHER): Payer: BLUE CROSS/BLUE SHIELD | Admitting: Orthopedic Surgery

## 2016-09-20 ENCOUNTER — Ambulatory Visit (INDEPENDENT_AMBULATORY_CARE_PROVIDER_SITE_OTHER): Payer: BLUE CROSS/BLUE SHIELD

## 2016-09-20 ENCOUNTER — Encounter: Payer: Self-pay | Admitting: Orthopedic Surgery

## 2016-09-20 VITALS — BP 143/73 | HR 77 | Wt 336.0 lb

## 2016-09-20 DIAGNOSIS — S83241A Other tear of medial meniscus, current injury, right knee, initial encounter: Secondary | ICD-10-CM

## 2016-09-20 DIAGNOSIS — M25561 Pain in right knee: Secondary | ICD-10-CM

## 2016-09-20 NOTE — Patient Instructions (Addendum)
Knee Arthroscopy °Knee arthroscopy is a surgical procedure that is used to examine the inside of your knee joint and repair any damage. The surgeon puts a small, lighted instrument with a camera on the tip (arthroscope) through a small incision in your knee. The camera sends pictures to a monitor in the operating room. Your surgeon uses those pictures to guide the surgical instruments through other incisions to the area of damage. °Knee arthroscopy can be used to treat many types of knee problems. It may be used: °· To repair a torn ligament. °· To repair or remove damaged tissue. °· To remove a fluid-filled sac (cyst) from your knee. °LET YOUR HEALTH CARE PROVIDER KNOW ABOUT: °· Any allergies you have. °· All medicines you are taking, including vitamins, herbs, eye drops, creams, and over-the-counter medicines. °· Previous problems you or members of your family have had with the use of anesthetics. °· Any blood disorders you have. °· Previous surgeries you have had. °· Any medical conditions you may have. °RISKS AND COMPLICATIONS °Generally, this is a safe procedure. However, problems may occur, including: °· Infection. °· Bleeding. °· Damage to blood vessels, nerves, or structures of your knee. °· A blood clot that forms in your leg and travels to your lung. °· Failure to relieve symptoms. °BEFORE THE PROCEDURE °· Ask your health care provider about: °¨ Changing or stopping your regular medicines. This is especially important if you are taking diabetes medicines or blood thinners. °¨ Taking medicines such as aspirin and ibuprofen. These medicines can thin your blood. Do not take these medicines before your procedure if your health care provider instructs you not to. °· Follow your health care provider's instructions about eating or drinking restrictions. °· Plan to have someone take you home after the procedure. °· If you go home right after the procedure, plan to have someone with you for 24 hours. °· Do not  drink alcohol unless your health care provider says that you can. °· Do not use any tobacco products, including cigarettes, chewing tobacco, or electronic cigarettes unless your health care provider says that you can. If you need help quitting, ask your health care provider. °· You may have a physical exam. °PROCEDURE °· An IV tube will be inserted into one of your veins. °· You will be given one or more of the following: °¨ A medicine that helps you relax (sedative). °¨ A medicine that numbs the area (local anesthetic). °¨ A medicine that makes you fall asleep (general anesthetic). °¨ A medicine that is injected into your spine that numbs the area below and slightly above the injection site (spinal anesthetic). °¨ A medicine that is injected into an area of your body that numbs everything below the injection site (regional anesthetic). °· A cuff may be placed around your upper leg to slow bleeding during the procedure. °· The surgeon will make a small number of incisions around your knee. °· Your knee joint will be flushed and filled with a germ-free (sterile) solution. °· The arthroscope will be passed through an incision into your knee joint. °· More instruments will be passed through other incisions to repair your knee as needed. °· The fluid will be removed from your knee. °· The incisions will be closed with adhesive strips or stitches (sutures). °· A bandage (dressing) will be placed over your knee. °The procedure may vary among health care providers and hospitals. °AFTER THE PROCEDURE °· Your blood pressure, heart rate, breathing rate and blood oxygen level   will be monitored often until the medicines you were given have worn off. °· You may be given medicine for pain. °· You may get crutches to help you walk without using your knee to support your body weight. °· You may have to wear compression stockings. These stocking help to prevent blood clots and reduce swelling in your legs. °  °This information is  not intended to replace advice given to you by your health care provider. Make sure you discuss any questions you have with your health care provider. °  °Document Released: 12/09/2000 Document Revised: 04/28/2015 Document Reviewed: 12/08/2014 °[employer]vier Interactive Patient Education ©2016 [employer] ° °

## 2016-09-20 NOTE — Progress Notes (Signed)
Chief Complaint  Patient presents with  . Knee Pain    RIGHT KNEE PAIN   HPI 3 month h/o Medial knee pain worsening over the last 3 months but actually started 1 year ago. Complains of 5 out of 10 constant pain unresponsive to Tylenol ibuprofen hydrocodone which is been taken for 6 weeks. She complains of throbbing burning aching radiating pain from the medial joint line with catching locking stiffness. She does experience some giving way symptoms. She experiences numbness and tingling in the posterior part of the calf but not in the hip. Her pain is exacerbated by walking working kneeling standing and pivoting  She complains of recent weight loss night sweats fatigue ankle leg swelling burning pain in the leg and tingling Review of Systems  Constitutional: Positive for diaphoresis, malaise/fatigue and weight loss.  Cardiovascular: Positive for leg swelling.  Neurological: Positive for tingling and sensory change.  All other systems reviewed and are negative.   Past Medical History:  Diagnosis Date  . Arthritis   . Diabetes mellitus without complication (HCC)   . Hypertension   . Sleep apnea     Past Surgical History:  Procedure Laterality Date  . CHOLECYSTECTOMY     15 years ago [facility]  . DORSAL COMPARTMENT RELEASE Left 07/10/2015   Procedure: RELEASE DORSAL COMPARTMENT (DEQUERVAIN);  Surgeon: Vickki HearingStanley E Murad Staples, MD;  Location: [facility];  Service: Orthopedics;  Laterality: Left;  . ORIF ANKLE FRACTURE Right    No family history on file. Social History  Substance Use Topics  . Smoking status: Former Smoker    Packs/day: 0.50    Years: 5.00    Types: Cigarettes    Quit date: 07/05/1994  . Smokeless tobacco: Not on file  . Alcohol use No   Current Meds  Medication Sig  . amLODipine (NORVASC) 5 MG tablet Take 5 mg by mouth daily.  . canagliflozin (INVOKANA) 300 MG TABS tablet Take 300 mg by mouth daily before breakfast.  . Cyanocobalamin (VITAMIN B-12 PO) Take 1 tablet by mouth  daily.  Marland Kitchen. HYDROcodone-acetaminophen (NORCO) 10-325 MG per tablet Take 1 tablet by mouth every 4 (four) hours as needed for moderate pain.  Marland Kitchen. ibuprofen (ADVIL,MOTRIN) 200 MG tablet Take 200 mg by mouth daily.  . Insulin Glargine (TOUJEO SOLOSTAR [place]) Inject 65 Units into the skin daily.   . irbesartan (AVAPRO) 300 MG tablet Take 300 mg by mouth daily.  . metFORMIN (GLUCOPHAGE) 1000 MG tablet Take 1,000 mg by mouth 2 (two) times daily with a meal.  . spironolactone (ALDACTONE) 25 MG tablet Take 25 mg by mouth daily.  Marland Kitchen. VITAMIN E PO Take 1 tablet by mouth daily.    BP (!) 143/73   Pulse 77   Wt (!) 336 lb (152.4 kg)   BMI 49.62 kg/m   Physical Exam  Constitutional: She is oriented to person, place, and time. She appears well-developed and well-nourished. No distress.  Cardiovascular: Normal rate and intact distal pulses.   Musculoskeletal:       Right knee: She exhibits effusion.  Neurological: She is alert and oriented to person, place, and time. She has normal reflexes. She exhibits normal muscle tone. Coordination normal.  Skin: Skin is warm and dry. No rash noted. She is not diaphoretic. No erythema. No pallor.  Psychiatric: She has a normal mood and affect. Her behavior is normal. Judgment and thought content normal.    Right Knee Exam   Tenderness  The patient is experiencing tenderness in the lateral  joint line and medial joint line.  Range of Motion  Extension:  5 abnormal  Flexion:  110 abnormal   Muscle Strength   The patient has normal right knee strength.  Tests  McMurray:  Medial - positive Lateral - negative Drawer:       Anterior - negative    Posterior - negative Varus: negative Valgus: negative  Other  Erythema: absent Scars: absent Sensation: normal Pulse: present Swelling: none Other tests: effusion present   Left Knee Exam   Tenderness  The patient is experiencing tenderness in the medial joint line.  Range of Motion  Extension: normal   Flexion: normal   Muscle Strength   The patient has normal left knee strength.  Tests  McMurray:  Medial - negative Lateral - negative Drawer:       Anterior - negative     Posterior - negative Varus: negative Valgus: negative  Other  Erythema: absent Scars: absent Sensation: normal Pulse: present Swelling: none       ASSESSMENT: My personal interpretation of the images:  Plain film shows symmetric narrowing both medial and lateral compartments with mild secondary bone changes of sclerosis and osteophyte formation small effusion. Without acute fracture  Encounter Diagnoses  Name Primary?  . Right knee pain Yes  . Medial meniscus tear, right, initial encounter      PLAN Mri right knee   Fuller Canada, MD 09/20/2016 2:48 PM  .meds

## 2016-09-22 ENCOUNTER — Ambulatory Visit (HOSPITAL_COMMUNITY)
Admission: RE | Admit: 2016-09-22 | Discharge: 2016-09-22 | Disposition: A | Discharge status: Home / Self Care | Payer: BLUE CROSS/BLUE SHIELD | Source: Ambulatory Visit | Attending: Orthopedic Surgery | Admitting: Orthopedic Surgery

## 2016-09-22 DIAGNOSIS — X58XXXA Exposure to other specified factors, initial encounter: Secondary | ICD-10-CM | POA: Diagnosis not present

## 2016-09-22 DIAGNOSIS — M25561 Pain in right knee: Secondary | ICD-10-CM | POA: Diagnosis not present

## 2016-09-22 DIAGNOSIS — S83241A Other tear of medial meniscus, current injury, right knee, initial encounter: Secondary | ICD-10-CM | POA: Insufficient documentation

## 2016-09-26 ENCOUNTER — Ambulatory Visit (INDEPENDENT_AMBULATORY_CARE_PROVIDER_SITE_OTHER): Payer: BLUE CROSS/BLUE SHIELD | Admitting: Orthopedic Surgery

## 2016-09-26 ENCOUNTER — Encounter: Payer: Self-pay | Admitting: Orthopedic Surgery

## 2016-09-26 VITALS — BP 140/75 | HR 68 | Ht 69.0 in | Wt 336.0 lb

## 2016-09-26 DIAGNOSIS — S83242D Other tear of medial meniscus, current injury, left knee, subsequent encounter: Secondary | ICD-10-CM | POA: Diagnosis not present

## 2016-09-26 NOTE — Patient Instructions (Signed)
OUT OF WORK NOTE, 10/21-11/30 SURGERY 10/26

## 2016-09-26 NOTE — Addendum Note (Signed)
Addended by: Adella HareBOOTHE, [name] B on: 09/26/2016 05:13 PM   Modules accepted: Orders, SmartSet

## 2016-09-26 NOTE — Progress Notes (Signed)
Patient ID: Terri HummingbirdLora J Logan, female   DOB: 1966/03/22, 50 y.o.   MRN: [id number]  Post op visit   Chief Complaint  Patient presents with  . Follow-up    MRI results of right knee.    BP 140/75   Pulse 68   Ht 5\' 9"  (1.753 m)   Wt (!) 336 lb (152.4 kg)   BMI 49.2262 kg/m   50 year old female three-month history of medial knee pain which started a year ago got worse over the last 3 months had mechanical symptoms so we center for MRI she has a torn medial meniscus with a complex tear severe arthritis of the knee  She would like to proceed with arthroscopic surgery versus continued nonoperative treatment to make her knee better. I have advised her she will get 75% pain relief in the remaining pain will be from arthritis  Review of systems left knee pain has been as she is transferring and loading the left knee because of the right knee pathology  The MRI is reviewed with its report and that shows torn medial meniscus and arthritis I agree with the report after independent interpretation and review  Arthroscopy right knee partial medial meniscectomy.

## 2016-10-12 NOTE — Patient Instructions (Signed)
Terri Logan  10/12/2016     @PREFPERIOPPHARMACY @   Your procedure is scheduled on  10/20/2016  [name]   [facility]  A.M.  Call this number if you have problems the morning of surgery:  [phone]   Remember:  Do not eat food or drink liquids after midnight.  Take these medicines the morning of surgery with A SIP OF WATER  Norvasc, hydrocodone, avapro.   Do not wear jewelry, make-up or nail polish.  Do not wear lotions, powders, or perfumes, or deoderant.  Do not shave 48 hours prior to surgery.  Men may shave face and neck.  Do not bring valuables to the hospital.  [facility] is not responsible for any belongings or valuables.  Contacts, dentures or bridgework may not be worn into surgery.  Leave your suitcase in the car.  After surgery it may be brought to your room.  For patients admitted to the hospital, discharge time will be determined by your treatment team.  Patients discharged the day of surgery will not be allowed to drive home.   Name and phone number of your driver:   family Special instructions:  none  Please read over the following fact sheets that you were given. Anesthesia Post-op Instructions and Care and Recovery After Surgery       Meniscus Injury, Arthroscopy Arthroscopy is a surgical procedure that involves the use of a small scope that has a camera and surgical instruments on the end (arthroscope). An arthroscope can be used to repair your meniscus injury.  LET [facility] PROVID[facility] KNOW ABOUT:  Any allergies you have.  All medicines you are taking, including vitamins, herbs, eyedrops, creams, and over-the-counter medicines.  Any recent colds or infections you have had or currently have.  Previous problems you or members of your family have had with the use of anesthetics.  Any blood disorders or blood clotting problems you have.  Previous surgeries you have had.  Medical conditions you have. RISKS AND  COMPLICATIONS Generally, this is a safe procedure. However, as with any procedure, problems can occur. Possible problems include:  Damage to nerves or blood vessels.  Excess bleeding.  Blood clots.  Infection. BEFORE THE PROCEDURE  Do not eat or drink for 6-8 hours before the procedure.  Take medicines as directed by your surgeon. Ask your surgeon about changing or stopping your regular medicines.  You may have lab tests the morning of surgery. PROCEDURE  You will be given one of the following:   A medicine that numbs the area (local anesthesia).  A medicine that makes you go to sleep (general anesthesia).  A medicine injected into your spine that numbs your body below the waist (spinal anesthesia). Most often, several small cuts (incisions) are made in the knee. The arthroscope and instruments go into the incisions to repair the damage. The torn portion of the meniscus is removed.  During this time, your surgeon may find a partial or complete tear in a cruciate ligament, such as the anterior cruciate ligament (ACL). A completely torn cruciate ligament is reconstructed by taking tissue from another part of the body (grafting) and placing it into the injured area. This requires several larger incisions to complete the repair. Sometimes, open surgery is needed for collateral ligament injuries. If a collateral ligament is found to be injured, your surgeon may staple or suture the tear through a slightly larger incision on the side of  the knee. AFTER THE PROCEDURE You will be taken to the recovery area where your progress will be monitored. When you are awake, stable, and taking fluids without complications, you will be allowed to go home. This is usually the same day. However, more extensive repairs of a ligament may require an overnight stay.  The recovery time after repairing your meniscus or ligament depends on the amount of damage to these structures. It also depends on whether or not  reconstructive knee surgery was needed.   A torn or stretched ligament (ligament sprain) may take 6-8 weeks to heal. It takes about the same amount of time if your surgeon removed a torn meniscus.  A repaired meniscus may require 6-12 weeks of recovery time.  A torn ligament needing reconstructive surgery may take 6-12 months to heal fully.   This information is not intended to replace advice given to you by your health care provider. Make sure you discuss any questions you have with your health care provider.   Document Released: 12/09/2000 Document Revised: 12/17/2013 Document Reviewed: 05/10/2013 Elsevier Interactive Patient Education 2016 [employer].  Arthroscopy, With Meniscus Injury, Care After Refer to this sheet in the next few weeks. These instructions provide you with general information on caring for yourself after your procedure. Your health care provider may also give you specific instructions. Your treatment has been planned according to the current medical practices, but problems sometimes occur. Call your health care provider if you have any problems or questions after your procedure. WHAT TO EXPECT AFTER THE PROCEDURE After your procedure, it is typical to have the following:  Pain and swelling in your knee.  Constipation.  Difficulty walking. HOME CARE INSTRUCTIONS   Use crutches and do knee exercises as directed by your health care provider.  Apply ice to the injured area:  Put ice in a plastic bag.  Place a towel between your skin and the bag.  Leave the ice on for 15-20 minutes, 3-4 times a day while awake. Do this for the first 2 days.  Rest and raise (elevate) your knee.  Change bandages (dressings) as directed by your health care provider.  Keep the wound dry and clean. The wound may be washed gently with soap and water. Gently blot or dab the wound dry. It is okay to take showers 24-48 hours after surgery. Do not take baths, use swimming pools, or  use hot tubs for 14 days, or as directed by your health care provider.  Only take over-the-counter or prescription medicines for pain, discomfort, or fever as directed by your health care provider.  Continue your normal diet as directed by your health care provider.  Do not lift anything more than 10 pounds or play contact sports for 3 weeks, or as directed by your health care provider.  If a brace was applied, use as directed by your health care provider.  Your health care provider will help with instructions for rehabilitation of your knee. SEEK MEDICAL CARE IF:   You have increased bleeding (more than a small spot) from the wound.  You have redness, swelling, or increasing pain in the wound.  Yellowish-white fluid (pus) is coming from your wound. SEEK IMMEDIATE MEDICAL CARE IF:   You develop a rash.  You have a fever or persistent symptoms for more than 2-3 days.  You have difficulty breathing.  You have increasing pain with movement of the knee. MAKE SURE YOU:   Understand these instructions.  Will watch your condition.  Will get help right away if you are not doing well or get worse.   This information is not intended to replace advice given to you by your health care provider. Make sure you discuss any questions you have with your health care provider.   Document Released: 07/01/2005 Document Revised: 08/14/2013 Document Reviewed: 05/21/2013 Elsevier Interactive Patient Education 2016 [employer] PATIENT INSTRUCTIONS POST-ANESTHESIA  IMMEDIATELY FOLLOWING SURGERY:  Do not drive or operate machinery for the first twenty four hours after surgery.  Do not make any important decisions for twenty four hours after surgery or while taking narcotic pain medications or sedatives.  If you develop intractable nausea and vomiting or a severe headache please notify your doctor immediately.  FOLLOW-UP:  Please make an appointment with your surgeon as instructed. You do not need  to follow up with anesthesia unless specifically instructed to do so.  WOUND CARE INSTRUCTIONS (if applicable):  Keep a dry clean dressing on the anesthesia/puncture wound site if there is drainage.  Once the wound has quit draining you may leave it open to air.  Generally you should leave the bandage intact for twenty four hours unless there is drainage.  If the epidural site drains for more than 36-48 hours please call the anesthesia department.  QUESTIONS?:  Please feel free to call your physician or the hospital operator if you have any questions, and they will be happy to assist you.

## 2016-10-13 ENCOUNTER — Encounter (HOSPITAL_COMMUNITY): Payer: Self-pay | Admitting: *Deleted

## 2016-10-13 ENCOUNTER — Encounter (HOSPITAL_COMMUNITY)
Admission: RE | Admit: 2016-10-13 | Discharge: 2016-10-13 | Disposition: A | Discharge status: Home / Self Care | Payer: BLUE CROSS/BLUE SHIELD | Source: Ambulatory Visit | Attending: Orthopedic Surgery | Admitting: Orthopedic Surgery

## 2016-10-13 DIAGNOSIS — Z01810 Encounter for preprocedural cardiovascular examination: Secondary | ICD-10-CM | POA: Insufficient documentation

## 2016-10-13 DIAGNOSIS — Z01812 Encounter for preprocedural laboratory examination: Secondary | ICD-10-CM | POA: Diagnosis not present

## 2016-10-13 LAB — BASIC METABOLIC PANEL
Anion gap: 9 (ref 5–15)
BUN: 12 mg/dL (ref 6–20)
CALCIUM: 9 mg/dL (ref 8.9–10.3)
CHLORIDE: 103 mmol/L (ref 101–111)
CO2: 25 mmol/L (ref 22–32)
CREATININE: 0.9 mg/dL (ref 0.44–1.00)
GFR calc Af Amer: >60 mL/min (ref >60)
Glucose, Bld: 148 mg/dL — ABNORMAL HIGH (ref 65–99)
Potassium: 4 mmol/L (ref 3.5–5.1)
SODIUM: 137 mmol/L (ref 135–145)

## 2016-10-13 LAB — CBC WITH DIFFERENTIAL/PLATELET
Basophils Absolute: 0 10*3/uL (ref 0.0–0.1)
Basophils Relative: 0 %
EOS ABS: 0.5 10*3/uL (ref 0.0–0.7)
EOS PCT: 5 %
HCT: 39.5 % (ref 36.0–46.0)
Hemoglobin: 12.6 g/dL (ref 12.0–15.0)
LYMPHS ABS: 3.4 10*3/uL (ref 0.7–4.0)
Lymphocytes Relative: 33 %
MCH: 27.3 pg (ref 26.0–34.0)
MCHC: 31.9 g/dL (ref 30.0–36.0)
MCV: 85.5 fL (ref 78.0–100.0)
MONO ABS: 0.6 10*3/uL (ref 0.1–1.0)
MONOS PCT: 6 %
Neutro Abs: 5.7 10*3/uL (ref 1.7–7.7)
Neutrophils Relative %: 56 %
PLATELETS: 355 10*3/uL (ref 150–400)
RBC: 4.62 MIL/uL (ref 3.87–5.11)
RDW: 15.5 % (ref 11.5–15.5)
WBC: 10.3 10*3/uL (ref 4.0–10.5)

## 2016-10-14 ENCOUNTER — Inpatient Hospital Stay (HOSPITAL_COMMUNITY): Admission: RE | Admit: 2016-10-14 | Payer: BLUE CROSS/BLUE SHIELD | Source: Ambulatory Visit

## 2016-10-14 ENCOUNTER — Encounter: Payer: Self-pay | Admitting: *Deleted

## 2016-10-14 NOTE — Progress Notes (Unsigned)
[facility] APPROVED SURGERY SCHEDULED FOR 10/20/16  REFERENCE # [id number]

## 2016-10-19 NOTE — H&P (Signed)
Chief Complaint  Patient presents with  . Knee Pain      RIGHT KNEE PAIN    HPI 3 month h/o Medial knee pain worsening over the last 3 months but actually started 1 year ago. Complains of 5 out of 10 constant pain unresponsive to Tylenol ibuprofen hydrocodone which is been taken for 6 weeks. She complains of throbbing burning aching radiating pain from the medial joint line with catching locking stiffness. She does experience some giving way symptoms. She experiences numbness and tingling in the posterior part of the calf but not in the hip. Her pain is exacerbated by walking working kneeling standing and pivoting   She complains of recent weight loss night sweats fatigue ankle leg swelling burning pain in the leg and tingling Review of Systems  Constitutional: Positive for diaphoresis, malaise/fatigue and weight loss.  Cardiovascular: Positive for leg swelling.  Neurological: Positive for tingling and sensory change.  All other systems reviewed and are negative.         Past Medical History:  Diagnosis Date  . Arthritis    . Diabetes mellitus without complication (HCC)    . Hypertension    . Sleep apnea             Past Surgical History:  Procedure Laterality Date  . CHOLECYSTECTOMY        15 years ago [facility]  . DORSAL COMPARTMENT RELEASE Left 07/10/2015    Procedure: RELEASE DORSAL COMPARTMENT (DEQUERVAIN);  Surgeon: Vickki Hearing, MD;  Location: [facility];  Service: Orthopedics;  Laterality: Left;  . ORIF ANKLE FRACTURE Right      No family history on file.      Social History  Substance Use Topics  . Smoking status: Former Smoker      Packs/day: 0.50      Years: 5.00      Types: Cigarettes      Quit date: 07/05/1994  . Smokeless tobacco: Not on file  . Alcohol use No    Active Medications      Current Meds  Medication Sig  . amLODipine (NORVASC) 5 MG tablet Take 5 mg by mouth daily.  . canagliflozin (INVOKANA) 300 MG TABS tablet Take 300 mg by mouth daily before  breakfast.  . Cyanocobalamin (VITAMIN B-12 PO) Take 1 tablet by mouth daily.  Marland Kitchen HYDROcodone-acetaminophen (NORCO) 10-325 MG per tablet Take 1 tablet by mouth every 4 (four) hours as needed for moderate pain.  Marland Kitchen ibuprofen (ADVIL,MOTRIN) 200 MG tablet Take 200 mg by mouth daily.  . Insulin Glargine (TOUJEO SOLOSTAR [place]) Inject 65 Units into the skin daily.   . irbesartan (AVAPRO) 300 MG tablet Take 300 mg by mouth daily.  . metFORMIN (GLUCOPHAGE) 1000 MG tablet Take 1,000 mg by mouth 2 (two) times daily with a meal.  . spironolactone (ALDACTONE) 25 MG tablet Take 25 mg by mouth daily.  Marland Kitchen VITAMIN E PO Take 1 tablet by mouth daily.        BP (!) 143/73   Pulse 77   Wt (!) 336 lb (152.4 kg)   BMI 49.62 kg/m    Physical Exam  Constitutional: She is oriented to person, place, and time. She appears well-developed and well-nourished. No distress.  Cardiovascular: Normal rate and intact distal pulses.   Musculoskeletal:       Right knee: She exhibits effusion.  Neurological: She is alert and oriented to person, place, and time. She has normal reflexes. She exhibits normal muscle tone. Coordination normal.  Skin: Skin is warm and dry. No rash noted. She is not diaphoretic. No erythema. No pallor.  Psychiatric: She has a normal mood and affect. Her behavior is normal. Judgment and thought content normal.      Right Knee Exam    Tenderness  The patient is experiencing tenderness in the lateral joint line and medial joint line.   Range of Motion  Extension:  5 abnormal  Flexion:  110 abnormal    Muscle Strength    The patient has normal right knee strength.   Tests  McMurray:  Medial - positive Lateral - negative Drawer:       Anterior - negative    Posterior - negative Varus: negative Valgus: negative   Other  Erythema: absent Scars: absent Sensation: normal Pulse: present Swelling: none Other tests: effusion present     Left Knee Exam    Tenderness  The patient is  experiencing tenderness in the medial joint line.   Range of Motion  Extension: normal  Flexion: normal    Muscle Strength    The patient has normal left knee strength.   Tests  McMurray:  Medial - negative Lateral - negative Drawer:       Anterior - negative     Posterior - negative Varus: negative Valgus: negative   Other  Erythema: absent Scars: absent Sensation: normal Pulse: present Swelling: none           ASSESSMENT: My personal interpretation of the images:  Plain film shows symmetric narrowing both medial and lateral compartments with mild secondary bone changes of sclerosis and osteophyte formation small effusion. Without acute fracture   MRI scan  IMPRESSION: 1. Severe complex tear of the posterior horn of the medial meniscus with a radial component which extends into the body. 2. Tricompartmental cartilage abnormalities as described above. 3. Intact ACL with mucinous degeneration.     Electronically Signed   By: Elige KoHetal  Patel   On: 09/23/2016 08:28  Diagnosis torn medial meniscus with osteoarthritis right knee Plan arthroscopy right knee partial medial meniscectomy

## 2016-10-20 ENCOUNTER — Encounter (HOSPITAL_COMMUNITY)
Admission: RE | Disposition: A | Discharge status: Home / Self Care | Payer: Self-pay | Source: Ambulatory Visit | Attending: Orthopedic Surgery

## 2016-10-20 ENCOUNTER — Ambulatory Visit (HOSPITAL_COMMUNITY): Payer: BLUE CROSS/BLUE SHIELD | Admitting: Anesthesiology

## 2016-10-20 ENCOUNTER — Ambulatory Visit (HOSPITAL_COMMUNITY)
Admission: RE | Admit: 2016-10-20 | Discharge: 2016-10-20 | Disposition: A | Discharge status: Home / Self Care | Payer: BLUE CROSS/BLUE SHIELD | Source: Ambulatory Visit | Attending: Orthopedic Surgery | Admitting: Orthopedic Surgery

## 2016-10-20 ENCOUNTER — Encounter (HOSPITAL_COMMUNITY): Payer: Self-pay | Admitting: *Deleted

## 2016-10-20 DIAGNOSIS — Z794 Long term (current) use of insulin: Secondary | ICD-10-CM | POA: Diagnosis not present

## 2016-10-20 DIAGNOSIS — M23221 Derangement of posterior horn of medial meniscus due to old tear or injury, right knee: Secondary | ICD-10-CM | POA: Diagnosis not present

## 2016-10-20 DIAGNOSIS — Z79899 Other long term (current) drug therapy: Secondary | ICD-10-CM | POA: Insufficient documentation

## 2016-10-20 DIAGNOSIS — I10 Essential (primary) hypertension: Secondary | ICD-10-CM | POA: Insufficient documentation

## 2016-10-20 DIAGNOSIS — Z79891 Long term (current) use of opiate analgesic: Secondary | ICD-10-CM | POA: Diagnosis not present

## 2016-10-20 DIAGNOSIS — M23321 Other meniscus derangements, posterior horn of medial meniscus, right knee: Secondary | ICD-10-CM

## 2016-10-20 DIAGNOSIS — E119 Type 2 diabetes mellitus without complications: Secondary | ICD-10-CM | POA: Diagnosis not present

## 2016-10-20 DIAGNOSIS — G4730 Sleep apnea, unspecified: Secondary | ICD-10-CM | POA: Insufficient documentation

## 2016-10-20 DIAGNOSIS — M25761 Osteophyte, right knee: Secondary | ICD-10-CM | POA: Insufficient documentation

## 2016-10-20 DIAGNOSIS — Z87891 Personal history of nicotine dependence: Secondary | ICD-10-CM | POA: Diagnosis not present

## 2016-10-20 DIAGNOSIS — M1711 Unilateral primary osteoarthritis, right knee: Secondary | ICD-10-CM | POA: Insufficient documentation

## 2016-10-20 DIAGNOSIS — Z9889 Other specified postprocedural states: Secondary | ICD-10-CM | POA: Insufficient documentation

## 2016-10-20 DIAGNOSIS — M21861 Other specified acquired deformities of right lower leg: Secondary | ICD-10-CM | POA: Diagnosis not present

## 2016-10-20 DIAGNOSIS — S83241A Other tear of medial meniscus, current injury, right knee, initial encounter: Secondary | ICD-10-CM | POA: Diagnosis not present

## 2016-10-20 DIAGNOSIS — Z9049 Acquired absence of other specified parts of digestive tract: Secondary | ICD-10-CM | POA: Insufficient documentation

## 2016-10-20 HISTORY — PX: KNEE ARTHROSCOPY WITH MEDIAL MENISECTOMY: SHX5651

## 2016-10-20 LAB — GLUCOSE, CAPILLARY
GLUCOSE-CAPILLARY: 140 mg/dL — AB (ref 65–99)
Glucose-Capillary: 134 mg/dL — ABNORMAL HIGH (ref 65–99)

## 2016-10-20 SURGERY — ARTHROSCOPY, KNEE, WITH MEDIAL MENISCECTOMY
Anesthesia: General | Site: Knee | Laterality: Right

## 2016-10-20 MED ORDER — ONDANSETRON HCL 4 MG/2ML IJ SOLN
4.0000 mg | Freq: Once | INTRAMUSCULAR | Status: AC
  Administered 2016-10-20: 4 mg via INTRAVENOUS
  Filled 2016-10-20: qty 2

## 2016-10-20 MED ORDER — ROCURONIUM BROMIDE 50 MG/5ML IV SOLN
INTRAVENOUS | Status: AC
  Filled 2016-10-20: qty 1

## 2016-10-20 MED ORDER — HYDROMORPHONE HCL 1 MG/ML IJ SOLN: 0.2500 mg | INTRAMUSCULAR | Status: DC | PRN

## 2016-10-20 MED ORDER — ROCURONIUM BROMIDE 100 MG/10ML IV SOLN
INTRAVENOUS | Status: DC | PRN
  Administered 2016-10-20: 20 mg via INTRAVENOUS
  Administered 2016-10-20: 10 mg via INTRAVENOUS
  Administered 2016-10-20: 5 mg via INTRAVENOUS

## 2016-10-20 MED ORDER — SUCCINYLCHOLINE CHLORIDE 20 MG/ML IJ SOLN
INTRAMUSCULAR | Status: DC | PRN
  Administered 2016-10-20: 140 mg via INTRAVENOUS

## 2016-10-20 MED ORDER — PROPOFOL 10 MG/ML IV BOLUS
INTRAVENOUS | Status: AC
  Filled 2016-10-20: qty 40

## 2016-10-20 MED ORDER — SODIUM CHLORIDE 0.9 % IR SOLN
Status: DC | PRN
  Administered 2016-10-20: 1000 mL

## 2016-10-20 MED ORDER — KETOROLAC TROMETHAMINE 30 MG/ML IJ SOLN
30.0000 mg | Freq: Once | INTRAMUSCULAR | Status: AC
  Administered 2016-10-20: 30 mg via INTRAVENOUS
  Filled 2016-10-20: qty 1

## 2016-10-20 MED ORDER — NEOSTIGMINE METHYLSULFATE 10 MG/10ML IV SOLN
INTRAVENOUS | Status: DC | PRN
  Administered 2016-10-20: 3 mg via INTRAVENOUS

## 2016-10-20 MED ORDER — ONDANSETRON HCL 4 MG/2ML IJ SOLN
4.0000 mg | Freq: Once | INTRAMUSCULAR | Status: AC
  Administered 2016-10-20: 4 mg via INTRAVENOUS

## 2016-10-20 MED ORDER — CEFAZOLIN IN D5W 1 GM/50ML IV SOLN
1.0000 g | Freq: Once | INTRAVENOUS | Status: AC
  Administered 2016-10-20: 1 g via INTRAVENOUS

## 2016-10-20 MED ORDER — LIDOCAINE HCL (PF) 1 % IJ SOLN
INTRAMUSCULAR | Status: AC
  Filled 2016-10-20: qty 5

## 2016-10-20 MED ORDER — CHLORHEXIDINE GLUCONATE 4 % EX LIQD: 60.0000 mL | Freq: Once | CUTANEOUS | Status: DC

## 2016-10-20 MED ORDER — CEFAZOLIN IN D5W 1 GM/50ML IV SOLN
INTRAVENOUS | Status: AC
  Filled 2016-10-20: qty 50

## 2016-10-20 MED ORDER — GLYCOPYRROLATE 0.2 MG/ML IJ SOLN
INTRAMUSCULAR | Status: AC
  Filled 2016-10-20: qty 7

## 2016-10-20 MED ORDER — SUCCINYLCHOLINE CHLORIDE 20 MG/ML IJ SOLN
INTRAMUSCULAR | Status: AC
  Filled 2016-10-20: qty 1

## 2016-10-20 MED ORDER — SODIUM CHLORIDE 0.9 % IJ SOLN
INTRAMUSCULAR | Status: AC
  Filled 2016-10-20: qty 10

## 2016-10-20 MED ORDER — DEXAMETHASONE SODIUM PHOSPHATE 4 MG/ML IJ SOLN
INTRAMUSCULAR | Status: AC
  Filled 2016-10-20: qty 1

## 2016-10-20 MED ORDER — MIDAZOLAM HCL 2 MG/2ML IJ SOLN
1.0000 mg | INTRAMUSCULAR | Status: DC | PRN
  Administered 2016-10-20: 2 mg via INTRAVENOUS

## 2016-10-20 MED ORDER — HYDROCODONE-ACETAMINOPHEN 10-325 MG PO TABS: 1.0000 | ORAL_TABLET | ORAL | 0 refills | Status: DC | PRN

## 2016-10-20 MED ORDER — ONDANSETRON HCL 4 MG/2ML IJ SOLN
INTRAMUSCULAR | Status: AC
  Filled 2016-10-20: qty 2

## 2016-10-20 MED ORDER — DEXTROSE 5 % IV SOLN
3.0000 g | INTRAVENOUS | Status: DC
  Filled 2016-10-20: qty 3000

## 2016-10-20 MED ORDER — BUPIVACAINE HCL (PF) 0.5 % IJ SOLN
INTRAMUSCULAR | Status: AC
  Filled 2016-10-20: qty 60

## 2016-10-20 MED ORDER — DEXAMETHASONE SODIUM PHOSPHATE 4 MG/ML IJ SOLN
4.0000 mg | Freq: Once | INTRAMUSCULAR | Status: AC
  Administered 2016-10-20: 4 mg via INTRAVENOUS

## 2016-10-20 MED ORDER — GLYCOPYRROLATE 0.2 MG/ML IJ SOLN
INTRAMUSCULAR | Status: DC | PRN
  Administered 2016-10-20: 0.6 mg via INTRAVENOUS

## 2016-10-20 MED ORDER — CEFAZOLIN SODIUM-DEXTROSE 2-4 GM/100ML-% IV SOLN
INTRAVENOUS | Status: AC
  Filled 2016-10-20: qty 100

## 2016-10-20 MED ORDER — EPHEDRINE SULFATE 50 MG/ML IJ SOLN
INTRAMUSCULAR | Status: AC
  Filled 2016-10-20: qty 1

## 2016-10-20 MED ORDER — BUPIVACAINE-EPINEPHRINE (PF) 0.5% -1:200000 IJ SOLN
INTRAMUSCULAR | Status: DC | PRN
  Administered 2016-10-20: 60 mL

## 2016-10-20 MED ORDER — MIDAZOLAM HCL 2 MG/2ML IJ SOLN
INTRAMUSCULAR | Status: AC
Start: 2016-10-20 — End: 2016-10-20
  Filled 2016-10-20: qty 2

## 2016-10-20 MED ORDER — FENTANYL CITRATE (PF) 100 MCG/2ML IJ SOLN
INTRAMUSCULAR | Status: DC | PRN
  Administered 2016-10-20 (×2): 50 ug via INTRAVENOUS

## 2016-10-20 MED ORDER — FENTANYL CITRATE (PF) 100 MCG/2ML IJ SOLN
INTRAMUSCULAR | Status: AC
  Filled 2016-10-20: qty 2

## 2016-10-20 MED ORDER — SODIUM CHLORIDE 0.9 % IR SOLN
Status: DC | PRN
  Administered 2016-10-20 (×3): 3000 mL

## 2016-10-20 MED ORDER — LIDOCAINE HCL (CARDIAC) 10 MG/ML IV SOLN
INTRAVENOUS | Status: DC | PRN
  Administered 2016-10-20: 50 mg via INTRAVENOUS

## 2016-10-20 MED ORDER — EPINEPHRINE PF 1 MG/ML IJ SOLN
INTRAMUSCULAR | Status: AC
  Filled 2016-10-20: qty 7

## 2016-10-20 MED ORDER — LACTATED RINGERS IV SOLN
INTRAVENOUS | Status: DC
  Administered 2016-10-20: 10:00:00 via INTRAVENOUS

## 2016-10-20 MED ORDER — HYDROCODONE-ACETAMINOPHEN 7.5-325 MG PO TABS
1.0000 | ORAL_TABLET | Freq: Once | ORAL | Status: AC
  Administered 2016-10-20: 1 via ORAL
  Filled 2016-10-20: qty 1

## 2016-10-20 MED ORDER — PROPOFOL 10 MG/ML IV BOLUS
INTRAVENOUS | Status: DC | PRN
  Administered 2016-10-20: 20 mg via INTRAVENOUS
  Administered 2016-10-20: 180 mg via INTRAVENOUS
  Administered 2016-10-20: 50 mg via INTRAVENOUS

## 2016-10-20 MED ORDER — NEOSTIGMINE METHYLSULFATE 10 MG/10ML IV SOLN
INTRAVENOUS | Status: AC
  Filled 2016-10-20: qty 2

## 2016-10-20 MED ORDER — CEFAZOLIN SODIUM-DEXTROSE 2-4 GM/100ML-% IV SOLN
2.0000 g | Freq: Once | INTRAVENOUS | Status: AC
  Administered 2016-10-20: 2 g via INTRAVENOUS

## 2016-10-20 SURGICAL SUPPLY — 54 items
ARTHROWAND PARAGON T2 (SURGICAL WAND)
BAG HAMPER (MISCELLANEOUS) ×2 IMPLANT
BANDAGE ELASTIC 6 LF NS (GAUZE/BANDAGES/DRESSINGS) ×1 IMPLANT
BANDAGE ELASTIC 6 VELCRO NS (GAUZE/BANDAGES/DRESSINGS) ×2 IMPLANT
BLADE AGGRESSIVE PLUS 4.0 (BLADE) ×2 IMPLANT
BLADE SURG SZ11 CARB STEEL (BLADE) ×2 IMPLANT
BNDG CMPR MED 5X6 ELC HKLP NS (GAUZE/BANDAGES/DRESSINGS) ×1
CHLORAPREP W/TINT 26ML (MISCELLANEOUS) ×3 IMPLANT
CLOTH BEACON ORANGE TIMEOUT ST (SAFETY) ×2 IMPLANT
COOLER CRYO IC GRAV AND TUBE (ORTHOPEDIC SUPPLIES) ×2 IMPLANT
CUFF CRYO KNEE LG 20X31 COOLER (ORTHOPEDIC SUPPLIES) ×1 IMPLANT
CUFF TOURNIQUET SINGLE 34IN LL (TOURNIQUET CUFF) ×1 IMPLANT
CUTTER ANGLED DBL BITE 4.5 (BURR) ×1 IMPLANT
DECANTER SPIKE VIAL GLASS SM (MISCELLANEOUS) ×4 IMPLANT
GAUZE SPONGE 4X4 12PLY STRL (GAUZE/BANDAGES/DRESSINGS) ×2 IMPLANT
GAUZE SPONGE 4X4 16PLY XRAY LF (GAUZE/BANDAGES/DRESSINGS) ×2 IMPLANT
GAUZE XEROFORM 5X9 LF (GAUZE/BANDAGES/DRESSINGS) ×2 IMPLANT
GLOVE BIOGEL PI IND STRL 7.0 (GLOVE) ×1 IMPLANT
GLOVE BIOGEL PI INDICATOR 7.0 (GLOVE) ×1
GLOVE SKINSENSE NS SZ8.0 LF (GLOVE) ×1
GLOVE SKINSENSE STRL SZ8.0 LF (GLOVE) ×1 IMPLANT
GLOVE SS N UNI LF 8.5 STRL (GLOVE) ×2 IMPLANT
GOWN STRL REUS W/ TWL LRG LVL3 (GOWN DISPOSABLE) ×1 IMPLANT
GOWN STRL REUS W/TWL LRG LVL3 (GOWN DISPOSABLE) ×2
GOWN STRL REUS W/TWL XL LVL3 (GOWN DISPOSABLE) ×2 IMPLANT
HLDR LEG FOAM (MISCELLANEOUS) ×1 IMPLANT
IV NS IRRIG 3000ML ARTHROMATIC (IV SOLUTION) ×5 IMPLANT
KIT BLADEGUARD II DBL (SET/KITS/TRAYS/PACK) ×2 IMPLANT
KIT ROOM TURNOVER AP CYSTO (KITS) ×2 IMPLANT
LEG HOLDER FOAM (MISCELLANEOUS) ×1
MANIFOLD NEPTUNE II (INSTRUMENTS) ×2 IMPLANT
MARKER SKIN DUAL TIP RULER LAB (MISCELLANEOUS) ×2 IMPLANT
NDL HYPO 18GX1.5 BLUNT FILL (NEEDLE) ×1 IMPLANT
NDL HYPO 21X1.5 SAFETY (NEEDLE) ×1 IMPLANT
NDL SPNL 18GX3.5 QUINCKE PK (NEEDLE) ×1 IMPLANT
NEEDLE HYPO 18GX1.5 BLUNT FILL (NEEDLE) ×2 IMPLANT
NEEDLE HYPO 21X1.5 SAFETY (NEEDLE) ×2 IMPLANT
NEEDLE SPNL 18GX3.5 QUINCKE PK (NEEDLE) ×2 IMPLANT
NS IRRIG 1000ML POUR BTL (IV SOLUTION) ×2 IMPLANT
PACK ARTHRO LIMB DRAPE STRL (MISCELLANEOUS) ×2 IMPLANT
PAD ABD 5X9 TENDERSORB (GAUZE/BANDAGES/DRESSINGS) ×2 IMPLANT
PAD ARMBOARD 7.5X6 YLW CONV (MISCELLANEOUS) ×2 IMPLANT
PADDING CAST COTTON 6X4 STRL (CAST SUPPLIES) ×2 IMPLANT
PADDING WEBRIL 6 STERILE (GAUZE/BANDAGES/DRESSINGS) ×1 IMPLANT
SET ARTHROSCOPY INST (INSTRUMENTS) ×2 IMPLANT
SET ARTHROSCOPY PUMP TUBE (IRRIGATION / IRRIGATOR) ×2 IMPLANT
SET BASIN LINEN APH (SET/KITS/TRAYS/PACK) ×2 IMPLANT
SUT ETHILON 3 0 FSL (SUTURE) ×2 IMPLANT
SYR 30ML LL (SYRINGE) ×2 IMPLANT
SYRINGE 10CC LL (SYRINGE) ×2 IMPLANT
TUBE CONNECTING 12X1/4 (SUCTIONS) ×6 IMPLANT
WAND 50 DEG COVAC W/CORD (SURGICAL WAND) ×1 IMPLANT
WAND 90 DEG TURBOVAC W/CORD (SURGICAL WAND) IMPLANT
WAND ARTHRO PARAGON T2 (SURGICAL WAND) IMPLANT

## 2016-10-20 NOTE — Transfer of Care (Signed)
Immediate Anesthesia Transfer of Care Note  Patient: Terri Logan  Procedure(s) Performed: Procedure(s): KNEE ARTHROSCOPY WITH MEDIAL MENISECTOMY (Right)  Patient Location: PACU  Anesthesia Type:General  Level of Consciousness: awake and patient cooperative  Airway & Oxygen Therapy: Patient Spontanous Breathing and non-rebreather face mask  Post-op Assessment: Report given to RN and Post -op Vital signs reviewed and stable  Post vital signs: Reviewed and stable  Last Vitals:  Vitals:   10/20/16 0908  Temp: 37 C    Last Pain:  Vitals:   10/20/16 0906  PainSc: (P) 5       Patients Stated Pain Goal: 6 (10/20/16 0908)  Complications: No apparent anesthesia complications

## 2016-10-20 NOTE — Interval H&P Note (Signed)
History and Physical Interval Note:  10/20/2016 9:51 AM Temp 98.6 F (37 C)   Terri Logan  has presented today for surgery, with the diagnosis of right knee meniscus tear  The various methods of treatment have been discussed with the patient and family. After consideration of risks, benefits and other options for treatment, the patient has consented to  Procedure(s): KNEE ARTHROSCOPY WITH MEDIAL MENISECTOMY (Right) as a surgical intervention .  The patient's history has been reviewed, patient examined, no change in status, stable for surgery.  I have reviewed the patient's chart and labs.  Questions were answered to the patient's satisfaction.     Fuller CanadaStanley Harrison

## 2016-10-20 NOTE — Anesthesia Preprocedure Evaluation (Signed)
Anesthesia Evaluation  Patient identified by MRN, date of birth, ID band Patient awake    Reviewed: Allergy & Precautions, NPO status , Patient's Chart, lab work & pertinent test results  Airway Mallampati: II  TM Distance: >3 FB     Dental  (+) Teeth Intact   Pulmonary sleep apnea and Continuous Positive Airway Pressure Ventilation , former smoker,    breath sounds clear to auscultation       Cardiovascular hypertension, Pt. on medications  Rhythm:Regular Rate:Normal     Neuro/Psych  Headaches, PSYCHIATRIC DISORDERS    GI/Hepatic negative GI ROS,   Endo/Other  diabetes, Type 2, Oral Hypoglycemic AgentsMorbid obesity  Renal/GU      Musculoskeletal  (+) Arthritis ,   Abdominal   Peds  Hematology   Anesthesia Other Findings   Reproductive/Obstetrics                             Anesthesia Physical Anesthesia Plan  ASA: III  Anesthesia Plan: General   Post-op Pain Management:    Induction: Intravenous, Rapid sequence and Cricoid pressure planned  Airway Management Planned: Oral ETT  Additional Equipment:   Intra-op Plan:   Post-operative Plan: Extubation in OR  Informed Consent: I have reviewed the patients History and Physical, chart, labs and discussed the procedure including the risks, benefits and alternatives for the proposed anesthesia with the patient or authorized representative who has indicated his/her understanding and acceptance.     Plan Discussed with:   Anesthesia Plan Comments:         Anesthesia Quick Evaluation

## 2016-10-20 NOTE — Brief Op Note (Signed)
10/20/2016  11:01 AM  PATIENT:  Terri Logan  50 y.o. female  PRE-OPERATIVE DIAGNOSIS:  right knee meniscus tear  POST-OPERATIVE DIAGNOSIS:  right knee meniscus tear  PROCEDURE:  Procedure(s): KNEE ARTHROSCOPY WITH MEDIAL MENISECTOMY (Right)   Torn medial meniscus mild oa medial and patellofemoral compartments  SURGEON:  Surgeon(s) and Role:    * Vickki HearingStanley E Jahzir Strohmeier, MD - Primary  PHYSICIAN ASSISTANT:   ASSISTANTS: none   ANESTHESIA:   general  EBL:  Total I/O In: 550 [I.V.:550] Out: 0   BLOOD ADMINISTERED:none  DRAINS: none   LOCAL MEDICATIONS USED:  MARCAINE     SPECIMEN:  No Specimen  DISPOSITION OF SPECIMEN:  N/A  COUNTS:  YES  TOURNIQUET:    DICTATION: .Dragon Dictation  PLAN OF CARE: Discharge to home after PACU  PATIENT DISPOSITION:  PACU - hemodynamically stable.   Delay start of Pharmacological VTE agent (>24hrs) due to surgical blood loss or risk of bleeding: not applicable  [id number]

## 2016-10-20 NOTE — Discharge Instructions (Signed)

## 2016-10-20 NOTE — Op Note (Signed)
10/20/2016  11:01 AM  PATIENT:  Terri Logan  50 y.o. female  PRE-OPERATIVE DIAGNOSIS:  right knee meniscus tear  POST-OPERATIVE DIAGNOSIS:  right knee meniscus tear  PROCEDURE:  Procedure(s): KNEE ARTHROSCOPY WITH MEDIAL MENISECTOMY (Right)   Torn medial meniscus mild oa medial and patellofemoral compartments  Knee arthroscopy dictation  The patient was identified in the preoperative holding area using 2 approved identification mechanisms. The chart was reviewed and updated. The surgical site was confirmed as RIGHT  knee and marked with an indelible marker.  The patient was taken to the operating room for anesthesia. After successful  GENERAL anesthesia, 3GM ANCEF was used as IV antibiotics.  The patient was placed in the supine position with the (RIGHT) the operative extremity in an arthroscopic leg holder and the opposite extremity in a padded leg holder.  The timeout was executed.  A lateral portal was established with an 11 blade and the scope was introduced into the joint. A diagnostic arthroscopy was performed in circumferential manner examining the entire knee joint. A medial portal was established and the diagnostic arthroscopy was repeated using a probe to palpate intra-articular structures as they were encountered.   The operative findings are   Medial DIFFUSE CHONDRAL THINNING MEDIAL FEM CONDYLE, TORN MEDIAL MENISCUS AT BODY  Lateral LFC FISSURE Patellofemoral GRADE 3 CHONDRAL DEFICIENCY TROCHLEA Notch OSTEOPHYTE AT ACL INSERT   The MEDIAL  meniscus was resected using a duckbill forceps. The meniscal fragments were removed with a motorized shaver. The meniscus was balanced with a combination of a motorized shaver and a 50 ArthroCare wand until a stable rim was obtained.  The arthroscopic pump was placed on the wash mode and any excess debris was removed from the joint using suction.  60 cc of Marcaine with epinephrine was injected through the  arthroscope.  The portals were closed with 3-0 nylon suture.  A sterile bandage, Ace wrap and Cryo/Cuff was placed and the Cryo/Cuff was activated. The patient was taken to the recovery room in stable condition.   SURGEON:  Surgeon(s) and Role:    * Vickki HearingStanley E Sapna Padron, MD - Primary  PHYSICIAN ASSISTANT:   ASSISTANTS: none   ANESTHESIA:   general  EBL:  Total I/O In: 550 [I.V.:550] Out: 0   BLOOD ADMINISTERED:none  DRAINS: none   LOCAL MEDICATIONS USED:  MARCAINE     SPECIMEN:  No Specimen  DISPOSITION OF SPECIMEN:  N/A  COUNTS:  YES  TOURNIQUET:    DICTATION: .Dragon Dictation  PLAN OF CARE: Discharge to home after PACU  PATIENT DISPOSITION:  PACU - hemodynamically stable.   Delay start of Pharmacological VTE agent (>24hrs) due to surgical blood loss or risk of bleeding: not applicable  [id number]

## 2016-10-20 NOTE — Anesthesia Procedure Notes (Signed)
Procedure Name: Intubation Date/Time: 10/20/2016 10:13 AM Performed by: Franco NonesYATES, Maddon Horton S Pre-anesthesia Checklist: Patient identified, Patient being monitored, Timeout performed, Emergency Drugs available and Suction available Patient Re-evaluated:Patient Re-evaluated prior to inductionOxygen Delivery Method: Circle System Utilized Preoxygenation: Pre-oxygenation with 100% oxygen Intubation Type: IV induction, Rapid sequence and Cricoid Pressure applied Laryngoscope Size: Miller and 2 Grade View: Grade I Tube type: Oral Tube size: 7.0 mm Number of attempts: 1 Airway Equipment and Method: Stylet and Oral airway Placement Confirmation: ETT inserted through vocal cords under direct vision,  positive ETCO2 and breath sounds checked- equal and bilateral Secured at: 22 cm Tube secured with: Tape Dental Injury: Teeth and Oropharynx as per pre-operative assessment

## 2016-10-20 NOTE — Anesthesia Postprocedure Evaluation (Signed)
Anesthesia Post Note  Patient: Terri Logan  Procedure(s) Performed: Procedure(s) (LRB): KNEE ARTHROSCOPY WITH MEDIAL MENISECTOMY (Right)  Patient location during evaluation: PACU Anesthesia Type: General Level of consciousness: awake Pain management: satisfactory to patient Vital Signs Assessment: post-procedure vital signs reviewed and stable Respiratory status: spontaneous breathing and non-rebreather facemask Cardiovascular status: stable Anesthetic complications: no    Last Vitals:  Vitals:   10/20/16 0908 10/20/16 1115  BP:  127/70  Pulse:  (!) 53  Resp:  11  Temp: 37 C 36.5 C    Last Pain:  Vitals:   10/20/16 1115  PainSc: Asleep                 Minerva AreolaYATES,[name]

## 2016-10-24 ENCOUNTER — Encounter: Payer: Self-pay | Admitting: Orthopedic Surgery

## 2016-10-24 ENCOUNTER — Ambulatory Visit (INDEPENDENT_AMBULATORY_CARE_PROVIDER_SITE_OTHER): Payer: BLUE CROSS/BLUE SHIELD | Admitting: Orthopedic Surgery

## 2016-10-24 DIAGNOSIS — Z4889 Encounter for other specified surgical aftercare: Secondary | ICD-10-CM

## 2016-10-24 MED ORDER — HYDROCODONE-ACETAMINOPHEN 7.5-325 MG PO TABS: 1.0000 | ORAL_TABLET | ORAL | 0 refills | Status: DC | PRN

## 2016-10-24 NOTE — Patient Instructions (Signed)
Scheduled physical therapy  Continue weightbearing as tolerated

## 2016-10-24 NOTE — Addendum Note (Signed)
Addended by: Gevena CottonSACKFIELD, Antawan Mchugh L on: 10/24/2016 09:38 AM   Modules accepted: Orders

## 2016-10-24 NOTE — Progress Notes (Signed)
Patient ID: Terri HummingbirdLora J Logan, female   DOB: 1966/02/05, 50 y.o.   MRN: [id number]  Post op visit   Chief Complaint  Patient presents with  . Follow-up    POST OP 1 SARK MM, DOS 10/20/16    There were no vitals taken for this visit.  PRE-OPERATIVE DIAGNOSIS:  right knee meniscus tear   POST-OPERATIVE DIAGNOSIS:  right knee meniscus tear   PROCEDURE:  Procedure(s): KNEE ARTHROSCOPY WITH MEDIAL MENISECTOMY (Right)    Torn medial meniscus mild oa medial and patellofemoral compartments   Portals: Suture removal. Clean dry intact no erythema  Calf soft supple no swelling negative Homans  Start physical therapy return in 3 weeks Therapy at hand in rehabilitation for 3 weeks 3 times a week  Next prescription will be Norco 7.5 mg every 4 #42

## 2016-10-25 ENCOUNTER — Encounter (HOSPITAL_COMMUNITY): Payer: Self-pay | Admitting: Orthopedic Surgery

## 2016-11-01 DIAGNOSIS — M25561 Pain in right knee: Secondary | ICD-10-CM | POA: Diagnosis not present

## 2016-11-01 DIAGNOSIS — M25661 Stiffness of right knee, not elsewhere classified: Secondary | ICD-10-CM | POA: Diagnosis not present

## 2016-11-01 DIAGNOSIS — M6249 Contracture of muscle, multiple sites: Secondary | ICD-10-CM | POA: Diagnosis not present

## 2016-11-01 DIAGNOSIS — M25461 Effusion, right knee: Secondary | ICD-10-CM | POA: Diagnosis not present

## 2016-11-03 DIAGNOSIS — M6249 Contracture of muscle, multiple sites: Secondary | ICD-10-CM | POA: Diagnosis not present

## 2016-11-03 DIAGNOSIS — M25661 Stiffness of right knee, not elsewhere classified: Secondary | ICD-10-CM | POA: Diagnosis not present

## 2016-11-03 DIAGNOSIS — M25561 Pain in right knee: Secondary | ICD-10-CM | POA: Diagnosis not present

## 2016-11-03 DIAGNOSIS — M25461 Effusion, right knee: Secondary | ICD-10-CM | POA: Diagnosis not present

## 2016-11-08 DIAGNOSIS — M6249 Contracture of muscle, multiple sites: Secondary | ICD-10-CM | POA: Diagnosis not present

## 2016-11-08 DIAGNOSIS — M25661 Stiffness of right knee, not elsewhere classified: Secondary | ICD-10-CM | POA: Diagnosis not present

## 2016-11-08 DIAGNOSIS — M25461 Effusion, right knee: Secondary | ICD-10-CM | POA: Diagnosis not present

## 2016-11-08 DIAGNOSIS — M25561 Pain in right knee: Secondary | ICD-10-CM | POA: Diagnosis not present

## 2016-11-10 DIAGNOSIS — M25561 Pain in right knee: Secondary | ICD-10-CM | POA: Diagnosis not present

## 2016-11-10 DIAGNOSIS — M6249 Contracture of muscle, multiple sites: Secondary | ICD-10-CM | POA: Diagnosis not present

## 2016-11-10 DIAGNOSIS — M25461 Effusion, right knee: Secondary | ICD-10-CM | POA: Diagnosis not present

## 2016-11-10 DIAGNOSIS — M25661 Stiffness of right knee, not elsewhere classified: Secondary | ICD-10-CM | POA: Diagnosis not present

## 2016-11-13 DIAGNOSIS — M6249 Contracture of muscle, multiple sites: Secondary | ICD-10-CM | POA: Diagnosis not present

## 2016-11-13 DIAGNOSIS — M25461 Effusion, right knee: Secondary | ICD-10-CM | POA: Diagnosis not present

## 2016-11-13 DIAGNOSIS — M25561 Pain in right knee: Secondary | ICD-10-CM | POA: Diagnosis not present

## 2016-11-13 DIAGNOSIS — M25661 Stiffness of right knee, not elsewhere classified: Secondary | ICD-10-CM | POA: Diagnosis not present

## 2016-11-14 ENCOUNTER — Encounter: Payer: Self-pay | Admitting: Orthopedic Surgery

## 2016-11-14 ENCOUNTER — Ambulatory Visit (INDEPENDENT_AMBULATORY_CARE_PROVIDER_SITE_OTHER): Payer: BLUE CROSS/BLUE SHIELD | Admitting: Orthopedic Surgery

## 2016-11-14 DIAGNOSIS — M25661 Stiffness of right knee, not elsewhere classified: Secondary | ICD-10-CM | POA: Diagnosis not present

## 2016-11-14 DIAGNOSIS — M6249 Contracture of muscle, multiple sites: Secondary | ICD-10-CM | POA: Diagnosis not present

## 2016-11-14 DIAGNOSIS — M25461 Effusion, right knee: Secondary | ICD-10-CM | POA: Diagnosis not present

## 2016-11-14 DIAGNOSIS — M25561 Pain in right knee: Secondary | ICD-10-CM | POA: Diagnosis not present

## 2016-11-14 DIAGNOSIS — Z9889 Other specified postprocedural states: Secondary | ICD-10-CM | POA: Diagnosis not present

## 2016-11-14 DIAGNOSIS — Z4889 Encounter for other specified surgical aftercare: Secondary | ICD-10-CM

## 2016-11-14 NOTE — Progress Notes (Signed)
Patient ID: [name], female   DOB: 1966-07-29, 50 y.o.   MRN: [id number]  Post op visit   Chief Complaint  Patient presents with  . Follow-up    3 week recheck on right knee, SARK, DOS 10-20-16.    Status post arthroscopy right knee patient improving. Therapy is at hand in rehabilitation.  She says is getting better every day  She has a little bit of weakness on extension. No swelling in the joint.  Recommend continued therapy for 3 weeks follow-up in 3 weeks

## 2016-11-14 NOTE — Patient Instructions (Signed)
CONTINUE THERAPY  

## 2016-11-16 DIAGNOSIS — M25561 Pain in right knee: Secondary | ICD-10-CM | POA: Diagnosis not present

## 2016-11-16 DIAGNOSIS — M6249 Contracture of muscle, multiple sites: Secondary | ICD-10-CM | POA: Diagnosis not present

## 2016-11-16 DIAGNOSIS — M25661 Stiffness of right knee, not elsewhere classified: Secondary | ICD-10-CM | POA: Diagnosis not present

## 2016-11-16 DIAGNOSIS — M25461 Effusion, right knee: Secondary | ICD-10-CM | POA: Diagnosis not present

## 2016-11-21 DIAGNOSIS — M25661 Stiffness of right knee, not elsewhere classified: Secondary | ICD-10-CM | POA: Diagnosis not present

## 2016-11-21 DIAGNOSIS — M6249 Contracture of muscle, multiple sites: Secondary | ICD-10-CM | POA: Diagnosis not present

## 2016-11-21 DIAGNOSIS — M25461 Effusion, right knee: Secondary | ICD-10-CM | POA: Diagnosis not present

## 2016-11-21 DIAGNOSIS — M25561 Pain in right knee: Secondary | ICD-10-CM | POA: Diagnosis not present

## 2016-11-25 DIAGNOSIS — M545 Low back pain: Secondary | ICD-10-CM | POA: Diagnosis not present

## 2016-11-25 DIAGNOSIS — Z6841 Body Mass Index (BMI) 40.0 and over, adult: Secondary | ICD-10-CM | POA: Diagnosis not present

## 2016-11-28 DIAGNOSIS — M25461 Effusion, right knee: Secondary | ICD-10-CM | POA: Diagnosis not present

## 2016-11-28 DIAGNOSIS — M25561 Pain in right knee: Secondary | ICD-10-CM | POA: Diagnosis not present

## 2016-11-28 DIAGNOSIS — M25661 Stiffness of right knee, not elsewhere classified: Secondary | ICD-10-CM | POA: Diagnosis not present

## 2016-11-28 DIAGNOSIS — M6249 Contracture of muscle, multiple sites: Secondary | ICD-10-CM | POA: Diagnosis not present

## 2016-12-01 DIAGNOSIS — M6249 Contracture of muscle, multiple sites: Secondary | ICD-10-CM | POA: Diagnosis not present

## 2016-12-01 DIAGNOSIS — M25461 Effusion, right knee: Secondary | ICD-10-CM | POA: Diagnosis not present

## 2016-12-01 DIAGNOSIS — M25661 Stiffness of right knee, not elsewhere classified: Secondary | ICD-10-CM | POA: Diagnosis not present

## 2016-12-01 DIAGNOSIS — M25561 Pain in right knee: Secondary | ICD-10-CM | POA: Diagnosis not present

## 2016-12-05 ENCOUNTER — Ambulatory Visit (INDEPENDENT_AMBULATORY_CARE_PROVIDER_SITE_OTHER): Payer: BLUE CROSS/BLUE SHIELD | Admitting: Orthopedic Surgery

## 2016-12-05 ENCOUNTER — Encounter: Payer: Self-pay | Admitting: Orthopedic Surgery

## 2016-12-05 DIAGNOSIS — Z9889 Other specified postprocedural states: Secondary | ICD-10-CM

## 2016-12-05 DIAGNOSIS — Z4889 Encounter for other specified surgical aftercare: Secondary | ICD-10-CM

## 2016-12-05 NOTE — Patient Instructions (Signed)
Return to work December 16

## 2016-12-05 NOTE — Progress Notes (Signed)
Patient ID: [name], female   DOB: 03/18/1966, 50 y.o.   MRN: [id number]  Post op visit   Chief Complaint  Patient presents with  . Follow-up    SALK, DOS 10/20/16    She's doing well she was to go back to work December 16 she has regained her range of motion and she has regained the ability to walk without supportive devices  Follow-up as needed

## 2016-12-08 DIAGNOSIS — E782 Mixed hyperlipidemia: Secondary | ICD-10-CM | POA: Diagnosis not present

## 2016-12-08 DIAGNOSIS — E1165 Type 2 diabetes mellitus with hyperglycemia: Secondary | ICD-10-CM | POA: Diagnosis not present

## 2016-12-12 DIAGNOSIS — G894 Chronic pain syndrome: Secondary | ICD-10-CM | POA: Diagnosis not present

## 2016-12-12 DIAGNOSIS — E782 Mixed hyperlipidemia: Secondary | ICD-10-CM | POA: Diagnosis not present

## 2016-12-12 DIAGNOSIS — E1165 Type 2 diabetes mellitus with hyperglycemia: Secondary | ICD-10-CM | POA: Diagnosis not present

## 2016-12-12 DIAGNOSIS — F5101 Primary insomnia: Secondary | ICD-10-CM | POA: Diagnosis not present

## 2017-03-03 ENCOUNTER — Encounter: Payer: Self-pay | Admitting: Orthopedic Surgery

## 2017-04-06 DIAGNOSIS — E782 Mixed hyperlipidemia: Secondary | ICD-10-CM | POA: Diagnosis not present

## 2017-04-06 DIAGNOSIS — E1165 Type 2 diabetes mellitus with hyperglycemia: Secondary | ICD-10-CM | POA: Diagnosis not present

## 2017-04-13 DIAGNOSIS — E1165 Type 2 diabetes mellitus with hyperglycemia: Secondary | ICD-10-CM | POA: Diagnosis not present

## 2017-04-13 DIAGNOSIS — F5101 Primary insomnia: Secondary | ICD-10-CM | POA: Diagnosis not present

## 2017-04-13 DIAGNOSIS — E782 Mixed hyperlipidemia: Secondary | ICD-10-CM | POA: Diagnosis not present

## 2017-04-13 DIAGNOSIS — M545 Low back pain: Secondary | ICD-10-CM | POA: Diagnosis not present

## 2017-06-13 DIAGNOSIS — M797 Fibromyalgia: Secondary | ICD-10-CM | POA: Diagnosis not present

## 2017-06-13 DIAGNOSIS — G894 Chronic pain syndrome: Secondary | ICD-10-CM | POA: Diagnosis not present

## 2017-07-05 DIAGNOSIS — E119 Type 2 diabetes mellitus without complications: Secondary | ICD-10-CM | POA: Diagnosis not present

## 2017-07-05 DIAGNOSIS — Z794 Long term (current) use of insulin: Secondary | ICD-10-CM | POA: Diagnosis not present

## 2017-07-05 DIAGNOSIS — H52203 Unspecified astigmatism, bilateral: Secondary | ICD-10-CM | POA: Diagnosis not present

## 2017-07-05 DIAGNOSIS — H524 Presbyopia: Secondary | ICD-10-CM | POA: Diagnosis not present

## 2017-07-10 DIAGNOSIS — E782 Mixed hyperlipidemia: Secondary | ICD-10-CM | POA: Diagnosis not present

## 2017-07-10 DIAGNOSIS — E1165 Type 2 diabetes mellitus with hyperglycemia: Secondary | ICD-10-CM | POA: Diagnosis not present

## 2017-07-13 DIAGNOSIS — E1165 Type 2 diabetes mellitus with hyperglycemia: Secondary | ICD-10-CM | POA: Diagnosis not present

## 2017-07-13 DIAGNOSIS — E782 Mixed hyperlipidemia: Secondary | ICD-10-CM | POA: Diagnosis not present

## 2017-07-13 DIAGNOSIS — I10 Essential (primary) hypertension: Secondary | ICD-10-CM | POA: Diagnosis not present

## 2017-07-13 DIAGNOSIS — F5101 Primary insomnia: Secondary | ICD-10-CM | POA: Diagnosis not present

## 2017-07-26 DIAGNOSIS — Z6841 Body Mass Index (BMI) 40.0 and over, adult: Secondary | ICD-10-CM | POA: Diagnosis not present

## 2017-07-26 DIAGNOSIS — I10 Essential (primary) hypertension: Secondary | ICD-10-CM | POA: Diagnosis not present

## 2017-07-26 DIAGNOSIS — E1165 Type 2 diabetes mellitus with hyperglycemia: Secondary | ICD-10-CM | POA: Diagnosis not present

## 2017-09-06 DIAGNOSIS — I10 Essential (primary) hypertension: Secondary | ICD-10-CM | POA: Diagnosis not present

## 2017-09-06 DIAGNOSIS — E1165 Type 2 diabetes mellitus with hyperglycemia: Secondary | ICD-10-CM | POA: Diagnosis not present

## 2017-09-13 DIAGNOSIS — E1165 Type 2 diabetes mellitus with hyperglycemia: Secondary | ICD-10-CM | POA: Diagnosis not present

## 2017-09-13 DIAGNOSIS — F5101 Primary insomnia: Secondary | ICD-10-CM | POA: Diagnosis not present

## 2017-09-13 DIAGNOSIS — I10 Essential (primary) hypertension: Secondary | ICD-10-CM | POA: Diagnosis not present

## 2017-09-13 DIAGNOSIS — E782 Mixed hyperlipidemia: Secondary | ICD-10-CM | POA: Diagnosis not present

## 2017-09-13 DIAGNOSIS — Z23 Encounter for immunization: Secondary | ICD-10-CM | POA: Diagnosis not present

## 2017-12-06 DIAGNOSIS — I10 Essential (primary) hypertension: Secondary | ICD-10-CM | POA: Diagnosis not present

## 2018-01-01 DIAGNOSIS — Z0001 Encounter for general adult medical examination with abnormal findings: Secondary | ICD-10-CM | POA: Diagnosis not present

## 2018-03-21 DIAGNOSIS — M25562 Pain in left knee: Secondary | ICD-10-CM | POA: Diagnosis not present

## 2018-03-21 DIAGNOSIS — M25561 Pain in right knee: Secondary | ICD-10-CM | POA: Diagnosis not present

## 2018-05-01 DIAGNOSIS — E782 Mixed hyperlipidemia: Secondary | ICD-10-CM | POA: Diagnosis not present

## 2018-05-04 DIAGNOSIS — I129 Hypertensive chronic kidney disease with stage 1 through stage 4 chronic kidney disease, or unspecified chronic kidney disease: Secondary | ICD-10-CM | POA: Diagnosis not present

## 2018-05-04 DIAGNOSIS — N183 Chronic kidney disease, stage 3 (moderate): Secondary | ICD-10-CM | POA: Diagnosis not present

## 2018-05-04 DIAGNOSIS — E782 Mixed hyperlipidemia: Secondary | ICD-10-CM | POA: Diagnosis not present

## 2018-05-04 DIAGNOSIS — E1165 Type 2 diabetes mellitus with hyperglycemia: Secondary | ICD-10-CM | POA: Diagnosis not present

## 2018-05-30 ENCOUNTER — Ambulatory Visit (INDEPENDENT_AMBULATORY_CARE_PROVIDER_SITE_OTHER): Payer: BLUE CROSS/BLUE SHIELD

## 2018-05-30 ENCOUNTER — Ambulatory Visit: Payer: BLUE CROSS/BLUE SHIELD | Admitting: Orthopedic Surgery

## 2018-05-30 ENCOUNTER — Encounter: Payer: Self-pay | Admitting: Orthopedic Surgery

## 2018-05-30 VITALS — BP 143/93 | HR 88 | Ht 66.0 in | Wt 335.0 lb

## 2018-05-30 DIAGNOSIS — M25562 Pain in left knee: Secondary | ICD-10-CM

## 2018-05-30 DIAGNOSIS — M25561 Pain in right knee: Secondary | ICD-10-CM

## 2018-05-30 DIAGNOSIS — M170 Bilateral primary osteoarthritis of knee: Secondary | ICD-10-CM

## 2018-05-30 NOTE — Progress Notes (Signed)
Progress Note   Patient ID: Terri Logan, female   DOB: 1966-08-06, 52 y.o.   MRN: [id number] Chief Complaint  Patient presents with  . Knee Pain    bilateral right greater than left     Medical decision-making  Imaging:  Xrays ordered: y/n ? Yes  X-ray right knee  X-ray left knee  Please see dictated reports right knee has osteoarthritis severe left knee has osteoarthritis severe both knees have mild varus alignment  Encounter Diagnoses  Name Primary?  . Pain in both knees, unspecified chronicity Yes  . Primary osteoarthritis of both knees      PLAN:  I advised the patient she needs to get down to 250 pounds to have a BMI of 40  I injected both knees  She will be seen again for AP pelvis and lumbar spine films for ongoing back pain  FURTHER WORKUP: not at this time  No orders of the defined types were placed in this encounter.   Procedure note left knee injection verbal consent was obtained to inject left knee joint  Timeout was completed to confirm the site of injection  The medications used were 40 mg of Depo-Medrol and 1% lidocaine 3 cc  Anesthesia was provided by ethyl chloride and the skin was prepped with alcohol.  After cleaning the skin with alcohol a 20-gauge needle was used to inject the left knee joint. There were no complications. A sterile bandage was applied.   Procedure note right knee injection verbal consent was obtained to inject right knee joint  Timeout was completed to confirm the site of injection  The medications used were 40 mg of Depo-Medrol and 1% lidocaine 3 cc  Anesthesia was provided by ethyl chloride and the skin was prepped with alcohol.  After cleaning the skin with alcohol a 20-gauge needle was used to inject the right knee joint. There were no complications. A sterile bandage was applied.     Chief Complaint  Patient presents with  . Knee Pain    bilateral right greater than left    52 year old female status  post right knee arthroscopy presents with bilateral left greater than right knee pain worse over the last month but pain for several months no history of trauma.  Complains of seen in severe pain not relieved with hydrocodone or aspirin.  She is also on Flexeril and gabapentin because of back and hip pain on the right side.  Her pain is medial left side is worse in the right she does not note any loss of motion or weakness but has some trouble when she stands up she says the legs do give out on her and locked up at night    Review of Systems  Musculoskeletal: Positive for back pain and joint pain.  Neurological: Negative for tingling, tremors, sensory change and focal weakness.   Current Meds  Medication Sig  . amLODipine (NORVASC) 10 MG tablet   . cholecalciferol (VITAMIN D) 1000 units tablet Take 1,000 Units by mouth daily.  . cyclobenzaprine (FLEXERIL) 10 MG tablet   . gabapentin (NEURONTIN) 600 MG tablet   . glipiZIDE (GLUCOTROL) 10 MG tablet Take 20 mg by mouth daily.  Marland Kitchen. HYDROcodone-acetaminophen (NORCO) 7.5-325 MG tablet Take 1 tablet by mouth every 4 (four) hours as needed for moderate pain.  . INVOKAMET [phone] MG TABS Take 2 tablets by mouth daily.  . irbesartan (AVAPRO) 300 MG tablet Take 300 mg by mouth daily.  . pantoprazole (PROTONIX) 40 MG tablet   .  rosuvastatin (CRESTOR) 5 MG tablet Take 5 mg by mouth daily.  Marland Kitchen spironolactone (ALDACTONE) 25 MG tablet Take 25 mg by mouth daily.  Nathen May SOLOSTAR 300 UNIT/ML SOPN Inject 72 Units into the skin daily.   . vitamin B-12 (CYANOCOBALAMIN) 500 MCG tablet Take 500 mcg by mouth daily.    Past Medical History:  Diagnosis Date  . Arthritis   . Diabetes mellitus without complication (HCC)   . Hypertension   . Sleep apnea      No Known Allergies  BP (!) 143/93   Pulse 88   Ht 5\' 6"  (1.676 m)   Wt (!) 335 lb (152 kg)   BMI 54.07 kg/m    Physical Exam  Constitutional: She is oriented to person, place, and time. She  appears well-developed and well-nourished.  Musculoskeletal:       Right knee: She exhibits no effusion.       Left knee: She exhibits no effusion.  Neurological: She is alert and oriented to person, place, and time.  Psychiatric: She has a normal mood and affect. Judgment normal.  Vitals reviewed.   Right Knee Exam   Muscle Strength  The patient has normal right knee strength.  Tenderness  The patient is experiencing tenderness in the medial joint line.  Range of Motion  Extension: normal  Flexion: normal   Tests  McMurray:  Medial - negative Lateral - negative Varus: negative Valgus: negative Drawer:  Anterior - negative    Posterior - negative  Other  Erythema: absent Scars: absent Sensation: normal Pulse: present Swelling: none Effusion: no effusion present   Left Knee Exam   Muscle Strength  The patient has normal left knee strength.  Tenderness  The patient is experiencing tenderness in the medial joint line and patella (Quadriceps tendon tenderness).  Range of Motion  Extension: normal  Flexion: normal   Tests  McMurray:  Medial - negative Lateral - negative Varus: negative Valgus: negative Drawer:  Anterior - negative     Posterior - negative  Other  Erythema: absent Scars: absent Sensation: normal Pulse: present Swelling: none Effusion: no effusion present      Fuller Canada, MD  4:19 PM 05/30/2018

## 2018-05-30 NOTE — Patient Instructions (Signed)
gwt referral to be seen for back and hip

## 2018-06-29 DIAGNOSIS — M170 Bilateral primary osteoarthritis of knee: Secondary | ICD-10-CM | POA: Diagnosis not present

## 2018-06-29 DIAGNOSIS — M25562 Pain in left knee: Secondary | ICD-10-CM | POA: Diagnosis not present

## 2018-06-29 DIAGNOSIS — M25561 Pain in right knee: Secondary | ICD-10-CM | POA: Diagnosis not present

## 2018-08-02 DIAGNOSIS — I10 Essential (primary) hypertension: Secondary | ICD-10-CM | POA: Diagnosis not present

## 2018-08-02 DIAGNOSIS — I129 Hypertensive chronic kidney disease with stage 1 through stage 4 chronic kidney disease, or unspecified chronic kidney disease: Secondary | ICD-10-CM | POA: Diagnosis not present

## 2018-08-02 DIAGNOSIS — E1165 Type 2 diabetes mellitus with hyperglycemia: Secondary | ICD-10-CM | POA: Diagnosis not present

## 2018-08-02 DIAGNOSIS — M170 Bilateral primary osteoarthritis of knee: Secondary | ICD-10-CM | POA: Diagnosis not present

## 2018-08-02 DIAGNOSIS — Z68.41 Body mass index (BMI) pediatric, 5th percentile to less than 85th percentile for age: Secondary | ICD-10-CM | POA: Diagnosis not present

## 2018-08-06 DIAGNOSIS — E782 Mixed hyperlipidemia: Secondary | ICD-10-CM | POA: Diagnosis not present

## 2018-08-06 DIAGNOSIS — E1165 Type 2 diabetes mellitus with hyperglycemia: Secondary | ICD-10-CM | POA: Diagnosis not present

## 2018-08-06 DIAGNOSIS — N182 Chronic kidney disease, stage 2 (mild): Secondary | ICD-10-CM | POA: Diagnosis not present

## 2018-08-29 ENCOUNTER — Ambulatory Visit: Payer: BLUE CROSS/BLUE SHIELD | Admitting: Orthopedic Surgery

## 2018-08-29 ENCOUNTER — Encounter: Payer: Self-pay | Admitting: Orthopedic Surgery

## 2018-08-29 VITALS — BP 146/91 | HR 86 | Ht 67.5 in | Wt 349.0 lb

## 2018-08-29 DIAGNOSIS — M170 Bilateral primary osteoarthritis of knee: Secondary | ICD-10-CM | POA: Diagnosis not present

## 2018-08-29 NOTE — Progress Notes (Signed)
Progress Note   Patient ID: Terri Logan, female   DOB: 1966-07-18, 52 y.o.   MRN: [id number]   Chief Complaint  Patient presents with  . Knee Pain    Bilat right worse than left    HPI The patient presents for evaluation of worsening bilateral knee pain status post injection in both knees several months ago has a history of osteoarthritis severe with moderate obesity and diabetes  Location bilateral knee pain medial joint line Duration several years Quality dull ache Severity severe breakthrough pain with hydrocodone Associated with stiffness decreased range of motion  Patient was on anti-inflammatories but had acute renal insult and was taken off NSAIDs  Review of Systems  Constitutional: Negative for chills and fever.  Respiratory: Positive for shortness of breath.   Cardiovascular: Negative for chest pain.  Skin: Negative.   Neurological: Negative for tingling.   Current Meds  Medication Sig  . amLODipine (NORVASC) 10 MG tablet   . cholecalciferol (VITAMIN D) 1000 units tablet Take 1,000 Units by mouth daily.  . cyclobenzaprine (FLEXERIL) 10 MG tablet   . gabapentin (NEURONTIN) 600 MG tablet   . glipiZIDE (GLUCOTROL) 10 MG tablet Take 20 mg by mouth daily.  Marland Kitchen HYDROcodone-acetaminophen (NORCO) 7.5-325 MG tablet Take 1 tablet by mouth every 4 (four) hours as needed for moderate pain.  . INVOKAMET [phone] MG TABS Take 2 tablets by mouth daily.  . irbesartan (AVAPRO) 300 MG tablet Take 300 mg by mouth daily.  . Methocarbamol (ROBAXIN PO) Take by mouth.  . pantoprazole (PROTONIX) 40 MG tablet   . rosuvastatin (CRESTOR) 5 MG tablet Take 5 mg by mouth daily.  Marland Kitchen spironolactone (ALDACTONE) 25 MG tablet Take 25 mg by mouth daily.  Nathen May SOLOSTAR 300 UNIT/ML SOPN Inject 72 Units into the skin daily.   . vitamin B-12 (CYANOCOBALAMIN) 500 MCG tablet Take 500 mcg by mouth daily.  . vitamin E 100 UNIT capsule Take 100 Units by mouth daily.    Past Medical History:   Diagnosis Date  . Arthritis   . Diabetes mellitus without complication (HCC)   . Hypertension   . Sleep apnea      No Known Allergies   BP (!) 146/91   Pulse 86   Ht 5' 7.5" (1.715 m)   Wt (!) 349 lb (158.3 kg)   BMI 53.85 kg/m    Physical Exam General appearance normal Oriented x3 normal Mood pleasant affect normal Gait, labored gait patient uses a cane  Ortho Exam Left knee lower extremity Varus alignment tenderness medial joint line and peds anserine bursa Flexion limited to 105 degrees No instability was detected Muscle tone strength normal no tremor Skin warm dry intact no rashes or lesions Normal pulse and temperature mild edema Normal sensation  Right knee mild varus alignment tenderness medial joint line Pez anserine bursa is tender flexion approximately 110 degrees no instability muscle strength and tone normal without tremor skin warm dry and intact no rash lesions or ulceration normal pulse and temperature mild edema normal sensation distally    MEDICAL DECISION MAKING   Imaging:  Previous imaging was reviewed she has severe varus alignment and osteoarthritis severe both knees   Encounter Diagnosis  Name Primary?  . Primary osteoarthritis of both knees Yes   The patient is noted to have a high BMI we discussed that her hemoglobin A1c is not adequate for surgery she can have surgery because the BMI think she should have a consult with joint replacement specialist  to deal with obesity and have bariatric clinics available for further evaluation and treatment she is agreeable to see Dr. Dorris Carnes who did her husband surgery  PLAN: (RX., injection, surgery,frx,mri/ct, XR 2 body ares) REFERRAL MADE TO DR SHIELDS  INJECT BOTH KNEES   Procedure note left knee injection verbal consent was obtained to inject left knee joint  Timeout was completed to confirm the site of injection  The medications used were 40 mg of Depo-Medrol and 1% lidocaine 3  cc  Anesthesia was provided by ethyl chloride and the skin was prepped with alcohol.  After cleaning the skin with alcohol a 20-gauge needle was used to inject the left knee joint. There were no complications. A sterile bandage was applied.   Procedure note right knee injection verbal consent was obtained to inject right knee joint  Timeout was completed to confirm the site of injection  The medications used were 40 mg of Depo-Medrol and 1% lidocaine 3 cc  Anesthesia was provided by ethyl chloride and the skin was prepped with alcohol.  After cleaning the skin with alcohol a 20-gauge needle was used to inject the right knee joint. There were no complications. A sterile bandage was applied.    No orders of the defined types were placed in this encounter.  2:37 PM 08/29/2018

## 2018-09-03 ENCOUNTER — Telehealth: Payer: Self-pay | Admitting: Radiology

## 2018-09-03 NOTE — Telephone Encounter (Signed)
I called patient regarding appointment with Dr Dorris Carnes. She can call there to make appointment.

## 2018-10-17 DIAGNOSIS — M25561 Pain in right knee: Secondary | ICD-10-CM | POA: Diagnosis not present

## 2018-10-17 DIAGNOSIS — M25562 Pain in left knee: Secondary | ICD-10-CM | POA: Diagnosis not present

## 2018-10-17 DIAGNOSIS — M170 Bilateral primary osteoarthritis of knee: Secondary | ICD-10-CM | POA: Diagnosis not present

## 2018-10-17 DIAGNOSIS — Z6841 Body Mass Index (BMI) 40.0 and over, adult: Secondary | ICD-10-CM | POA: Diagnosis not present

## 2018-12-05 DIAGNOSIS — E782 Mixed hyperlipidemia: Secondary | ICD-10-CM | POA: Diagnosis not present

## 2018-12-05 DIAGNOSIS — N183 Chronic kidney disease, stage 3 (moderate): Secondary | ICD-10-CM | POA: Diagnosis not present

## 2018-12-05 DIAGNOSIS — I10 Essential (primary) hypertension: Secondary | ICD-10-CM | POA: Diagnosis not present

## 2018-12-05 DIAGNOSIS — E1165 Type 2 diabetes mellitus with hyperglycemia: Secondary | ICD-10-CM | POA: Diagnosis not present

## 2018-12-12 DIAGNOSIS — E782 Mixed hyperlipidemia: Secondary | ICD-10-CM | POA: Diagnosis not present

## 2018-12-12 DIAGNOSIS — E1122 Type 2 diabetes mellitus with diabetic chronic kidney disease: Secondary | ICD-10-CM | POA: Diagnosis not present

## 2018-12-12 DIAGNOSIS — I10 Essential (primary) hypertension: Secondary | ICD-10-CM | POA: Diagnosis not present

## 2018-12-12 DIAGNOSIS — N182 Chronic kidney disease, stage 2 (mild): Secondary | ICD-10-CM | POA: Diagnosis not present

## 2019-01-09 DIAGNOSIS — G894 Chronic pain syndrome: Secondary | ICD-10-CM | POA: Diagnosis not present

## 2019-01-09 DIAGNOSIS — I10 Essential (primary) hypertension: Secondary | ICD-10-CM | POA: Diagnosis not present

## 2019-01-09 DIAGNOSIS — M1711 Unilateral primary osteoarthritis, right knee: Secondary | ICD-10-CM | POA: Diagnosis not present

## 2019-01-17 DIAGNOSIS — Z7952 Long term (current) use of systemic steroids: Secondary | ICD-10-CM | POA: Diagnosis not present

## 2019-01-17 DIAGNOSIS — M170 Bilateral primary osteoarthritis of knee: Secondary | ICD-10-CM | POA: Diagnosis not present

## 2019-03-11 DIAGNOSIS — M179 Osteoarthritis of knee, unspecified: Secondary | ICD-10-CM | POA: Diagnosis not present

## 2019-03-11 DIAGNOSIS — E1165 Type 2 diabetes mellitus with hyperglycemia: Secondary | ICD-10-CM | POA: Diagnosis not present

## 2019-03-11 DIAGNOSIS — G894 Chronic pain syndrome: Secondary | ICD-10-CM | POA: Diagnosis not present

## 2019-03-11 DIAGNOSIS — M545 Low back pain: Secondary | ICD-10-CM | POA: Diagnosis not present

## 2019-03-19 DIAGNOSIS — M545 Low back pain: Secondary | ICD-10-CM | POA: Diagnosis not present

## 2019-03-19 DIAGNOSIS — G894 Chronic pain syndrome: Secondary | ICD-10-CM | POA: Diagnosis not present

## 2019-03-19 DIAGNOSIS — E1165 Type 2 diabetes mellitus with hyperglycemia: Secondary | ICD-10-CM | POA: Diagnosis not present

## 2019-03-19 DIAGNOSIS — M170 Bilateral primary osteoarthritis of knee: Secondary | ICD-10-CM | POA: Diagnosis not present

## 2019-05-30 DIAGNOSIS — N183 Chronic kidney disease, stage 3 (moderate): Secondary | ICD-10-CM | POA: Diagnosis not present

## 2019-05-30 DIAGNOSIS — E1165 Type 2 diabetes mellitus with hyperglycemia: Secondary | ICD-10-CM | POA: Diagnosis not present

## 2019-05-30 DIAGNOSIS — E782 Mixed hyperlipidemia: Secondary | ICD-10-CM | POA: Diagnosis not present

## 2019-05-30 DIAGNOSIS — E1122 Type 2 diabetes mellitus with diabetic chronic kidney disease: Secondary | ICD-10-CM | POA: Diagnosis not present

## 2019-05-30 DIAGNOSIS — I10 Essential (primary) hypertension: Secondary | ICD-10-CM | POA: Diagnosis not present

## 2019-06-05 DIAGNOSIS — E1165 Type 2 diabetes mellitus with hyperglycemia: Secondary | ICD-10-CM | POA: Diagnosis not present

## 2019-06-05 DIAGNOSIS — Z0001 Encounter for general adult medical examination with abnormal findings: Secondary | ICD-10-CM | POA: Diagnosis not present

## 2019-06-05 DIAGNOSIS — I129 Hypertensive chronic kidney disease with stage 1 through stage 4 chronic kidney disease, or unspecified chronic kidney disease: Secondary | ICD-10-CM | POA: Diagnosis not present

## 2019-06-05 DIAGNOSIS — E1122 Type 2 diabetes mellitus with diabetic chronic kidney disease: Secondary | ICD-10-CM | POA: Diagnosis not present

## 2019-06-05 DIAGNOSIS — D869 Sarcoidosis, unspecified: Secondary | ICD-10-CM | POA: Diagnosis not present

## 2019-06-19 DIAGNOSIS — R0602 Shortness of breath: Secondary | ICD-10-CM | POA: Diagnosis not present

## 2019-06-19 DIAGNOSIS — D869 Sarcoidosis, unspecified: Secondary | ICD-10-CM | POA: Diagnosis not present

## 2019-06-19 DIAGNOSIS — K219 Gastro-esophageal reflux disease without esophagitis: Secondary | ICD-10-CM | POA: Diagnosis not present

## 2019-06-19 DIAGNOSIS — M25561 Pain in right knee: Secondary | ICD-10-CM | POA: Diagnosis not present

## 2019-06-19 DIAGNOSIS — M170 Bilateral primary osteoarthritis of knee: Secondary | ICD-10-CM | POA: Diagnosis not present

## 2019-08-21 DIAGNOSIS — M1711 Unilateral primary osteoarthritis, right knee: Secondary | ICD-10-CM | POA: Diagnosis not present

## 2019-10-09 DIAGNOSIS — I10 Essential (primary) hypertension: Secondary | ICD-10-CM | POA: Diagnosis not present

## 2019-10-09 DIAGNOSIS — E1122 Type 2 diabetes mellitus with diabetic chronic kidney disease: Secondary | ICD-10-CM | POA: Diagnosis not present

## 2019-10-09 DIAGNOSIS — E782 Mixed hyperlipidemia: Secondary | ICD-10-CM | POA: Diagnosis not present

## 2019-10-09 DIAGNOSIS — E1165 Type 2 diabetes mellitus with hyperglycemia: Secondary | ICD-10-CM | POA: Diagnosis not present

## 2019-10-16 ENCOUNTER — Other Ambulatory Visit: Payer: Self-pay

## 2019-10-16 ENCOUNTER — Emergency Department (HOSPITAL_COMMUNITY)
Admission: EM | Admit: 2019-10-16 | Discharge: 2019-10-16 | Disposition: A | Discharge status: Home / Self Care | Payer: BC Managed Care – PPO | Attending: Emergency Medicine | Admitting: Emergency Medicine

## 2019-10-16 ENCOUNTER — Encounter (HOSPITAL_COMMUNITY): Payer: Self-pay | Admitting: *Deleted

## 2019-10-16 DIAGNOSIS — T383X6A Underdosing of insulin and oral hypoglycemic [antidiabetic] drugs, initial encounter: Secondary | ICD-10-CM | POA: Insufficient documentation

## 2019-10-16 DIAGNOSIS — Z794 Long term (current) use of insulin: Secondary | ICD-10-CM | POA: Insufficient documentation

## 2019-10-16 DIAGNOSIS — E860 Dehydration: Secondary | ICD-10-CM | POA: Diagnosis not present

## 2019-10-16 DIAGNOSIS — N182 Chronic kidney disease, stage 2 (mild): Secondary | ICD-10-CM | POA: Diagnosis not present

## 2019-10-16 DIAGNOSIS — Z68.41 Body mass index (BMI) pediatric, 5th percentile to less than 85th percentile for age: Secondary | ICD-10-CM | POA: Diagnosis not present

## 2019-10-16 DIAGNOSIS — E1165 Type 2 diabetes mellitus with hyperglycemia: Secondary | ICD-10-CM | POA: Diagnosis not present

## 2019-10-16 DIAGNOSIS — Z79899 Other long term (current) drug therapy: Secondary | ICD-10-CM | POA: Insufficient documentation

## 2019-10-16 DIAGNOSIS — I129 Hypertensive chronic kidney disease with stage 1 through stage 4 chronic kidney disease, or unspecified chronic kidney disease: Secondary | ICD-10-CM | POA: Diagnosis not present

## 2019-10-16 DIAGNOSIS — Z87891 Personal history of nicotine dependence: Secondary | ICD-10-CM | POA: Diagnosis not present

## 2019-10-16 DIAGNOSIS — I10 Essential (primary) hypertension: Secondary | ICD-10-CM | POA: Insufficient documentation

## 2019-10-16 DIAGNOSIS — E782 Mixed hyperlipidemia: Secondary | ICD-10-CM | POA: Diagnosis not present

## 2019-10-16 DIAGNOSIS — Z91120 Patient's intentional underdosing of medication regimen due to financial hardship: Secondary | ICD-10-CM | POA: Insufficient documentation

## 2019-10-16 DIAGNOSIS — E1122 Type 2 diabetes mellitus with diabetic chronic kidney disease: Secondary | ICD-10-CM | POA: Diagnosis not present

## 2019-10-16 DIAGNOSIS — R739 Hyperglycemia, unspecified: Secondary | ICD-10-CM

## 2019-10-16 DIAGNOSIS — N289 Disorder of kidney and ureter, unspecified: Secondary | ICD-10-CM | POA: Insufficient documentation

## 2019-10-16 HISTORY — DX: Sarcoidosis, unspecified: D86.9

## 2019-10-16 LAB — CBC WITH DIFFERENTIAL/PLATELET
Abs Immature Granulocytes: 0.02 10*3/uL (ref 0.00–0.07)
Basophils Absolute: 0 10*3/uL (ref 0.0–0.1)
Basophils Relative: 1 %
Eosinophils Absolute: 0.1 10*3/uL (ref 0.0–0.5)
Eosinophils Relative: 2 %
HCT: 39.3 % (ref 36.0–46.0)
Hemoglobin: 12.4 g/dL (ref 12.0–15.0)
Immature Granulocytes: 0 %
Lymphocytes Relative: 32 %
Lymphs Abs: 2.3 10*3/uL (ref 0.7–4.0)
MCH: 26.6 pg (ref 26.0–34.0)
MCHC: 31.6 g/dL (ref 30.0–36.0)
MCV: 84.3 fL (ref 80.0–100.0)
Monocytes Absolute: 0.5 10*3/uL (ref 0.1–1.0)
Monocytes Relative: 7 %
Neutro Abs: 4.2 10*3/uL (ref 1.7–7.7)
Neutrophils Relative %: 58 %
Platelets: 286 10*3/uL (ref 150–400)
RBC: 4.66 MIL/uL (ref 3.87–5.11)
RDW: 14.1 % (ref 11.5–15.5)
WBC: 7.2 10*3/uL (ref 4.0–10.5)
nRBC: 0 % (ref 0.0–0.2)

## 2019-10-16 LAB — COMPREHENSIVE METABOLIC PANEL
ALT: 25 U/L (ref 0–44)
AST: 25 U/L (ref 15–41)
Albumin: 3.6 g/dL (ref 3.5–5.0)
Alkaline Phosphatase: 117 U/L (ref 38–126)
Anion gap: 12 (ref 5–15)
BUN: 18 mg/dL (ref 6–20)
CO2: 25 mmol/L (ref 22–32)
Calcium: 9.1 mg/dL (ref 8.9–10.3)
Chloride: 90 mmol/L — ABNORMAL LOW (ref 98–111)
Creatinine, Ser: 1.68 mg/dL — ABNORMAL HIGH (ref 0.44–1.00)
GFR calc Af Amer: 40 mL/min — ABNORMAL LOW (ref >60)
GFR calc non Af Amer: 34 mL/min — ABNORMAL LOW (ref >60)
Glucose, Bld: 526 mg/dL (ref 70–99)
Potassium: 4.5 mmol/L (ref 3.5–5.1)
Sodium: 127 mmol/L — ABNORMAL LOW (ref 135–145)
Total Bilirubin: 0.6 mg/dL (ref 0.3–1.2)
Total Protein: 6.8 g/dL (ref 6.5–8.1)

## 2019-10-16 LAB — CBG MONITORING, ED
Glucose-Capillary: 542 mg/dL (ref 70–99)
Glucose-Capillary: 445 mg/dL — ABNORMAL HIGH (ref 70–99)
Glucose-Capillary: 343 mg/dL — ABNORMAL HIGH (ref 70–99)

## 2019-10-16 MED ORDER — ALUM & MAG HYDROXIDE-SIMETH 200-200-20 MG/5ML PO SUSP
30.0000 mL | Freq: Once | ORAL | Status: AC
  Administered 2019-10-16: 20:00:00 30 mL via ORAL
  Filled 2019-10-16: qty 30

## 2019-10-16 MED ORDER — INSULIN ASPART 100 UNIT/ML ~~LOC~~ SOLN
10.0000 [IU] | Freq: Once | SUBCUTANEOUS | Status: AC
  Administered 2019-10-16: 17:00:00 10 [IU] via SUBCUTANEOUS
  Filled 2019-10-16: qty 1

## 2019-10-16 MED ORDER — SODIUM CHLORIDE 0.9 % IV BOLUS
2000.0000 mL | Freq: Once | INTRAVENOUS | Status: AC
  Administered 2019-10-16: 2000 mL via INTRAVENOUS

## 2019-10-16 MED ORDER — LIDOCAINE VISCOUS HCL 2 % MT SOLN
15.0000 mL | Freq: Once | OROMUCOSAL | Status: AC
  Administered 2019-10-16: 15 mL via ORAL
  Filled 2019-10-16: qty 15

## 2019-10-16 NOTE — ED Provider Notes (Signed)
[facility] EMERGENCY DEPARTMENT Provider Note   CSN: [id number] Arrival date & time: 10/16/19  1445     History   Chief Complaint Chief Complaint  Patient presents with  . Hyperglycemia    HPI Terri Logan is a 53 y.o. female.     HPI Patient with history of diabetes.  States she has been conserving her insulin pens due to cost for the past month.  She is at increased thirst and urinary frequency.  Also complains of generalized fatigue.  Was seen by her primary physician today and had her blood sugar checked.  It was 700.  Advised to come to the emergency department.  Patient denies focal weakness or numbness.  Complains of generalized headache consistent with previous migraines.  No neck pain or stiffness.  No fever or chills.  Denies abdominal pain, chest pain. Past Medical History:  Diagnosis Date  . Arthritis   . Diabetes mellitus without complication (HCC)   . Hypertension   . Sarcoidosis   . Sleep apnea     Patient Active Problem List   Diagnosis Date Noted  . Medial meniscus, posterior horn derangement, right   . De Quervain's syndrome (tenosynovitis)   . DIABETES MELLITUS, UNCONTROLLED 07/22/2009  . ANKLE, ARTHRITIS, DEGEN./OSTEO 08/21/2008  . ADJUSTMENT DISORDER WITH DEPRESSED MOOD 08/13/2008  . ALLERGIC RHINITIS 05/15/2008  . LEG PAIN, CHRONIC 10/05/2007  . LEUKOCYTOSIS 04/05/2007  . SORE THROAT 04/05/2007  . SARCOIDOSIS 01/29/2007  . OBESITY, MORBID 01/29/2007  . OBSTRUCTIVE SLEEP APNEA 01/29/2007  . MIGRAINE HEADACHE 01/29/2007  . HYPERTENSION 01/29/2007  . OSTEOARTHRITIS 01/29/2007    Past Surgical History:  Procedure Laterality Date  . CHOLECYSTECTOMY     15 years ago [facility]  . DORSAL COMPARTMENT RELEASE Left 07/10/2015   Procedure: RELEASE DORSAL COMPARTMENT (DEQUERVAIN);  Surgeon: Vickki HearingStanley E Harrison, MD;  Location: [facility];  Service: Orthopedics;  Laterality: Left;  . KNEE ARTHROSCOPY WITH MEDIAL MENISECTOMY Right 10/20/2016   Procedure: KNEE  ARTHROSCOPY WITH MEDIAL MENISECTOMY;  Surgeon: Vickki HearingStanley E Harrison, MD;  Location: [facility];  Service: Orthopedics;  Laterality: Right;  . ORIF ANKLE FRACTURE Right      OB History   No obstetric history on file.      Home Medications    Prior to Admission medications   Medication Sig Start Date End Date Taking? Authorizing Provider  albuterol (VENTOLIN HFA) 108 (90 Base) MCG/ACT inhaler Inhale 1-2 puffs into the lungs every 4 (four) hours as needed for wheezing or shortness of breath.  07/08/19  Yes [provider]  amLODipine (NORVASC) 10 MG tablet Take 10 mg by mouth every morning.  05/22/18  Yes [provider]  cholecalciferol (VITAMIN D) 1000 units tablet Take 1,000 Units by mouth daily.   Yes [provider]  cyclobenzaprine (FLEXERIL) 10 MG tablet Take 10 mg by mouth daily. *May take three times daily as needed for muscle spasms* 05/22/18  Yes [provider]  gabapentin (NEURONTIN) 600 MG tablet Take 600 mg by mouth 3 (three) times daily.  05/22/18  Yes [provider]  glipiZIDE (GLUCOTROL) 10 MG tablet Take 20 mg by mouth daily. 08/30/16  Yes [provider]  HYDROcodone-acetaminophen (NORCO) 10-325 MG tablet Take 1 tablet by mouth every 6 (six) hours.  10/02/19  Yes [provider]  Insulin Glargine (BASAGLAR KWIKPEN) 100 UNIT/ML SOPN Inject 70 Units into the skin at bedtime.   Yes [provider]  irbesartan-hydrochlorothiazide (AVALIDE) 300-12.5 MG tablet Take 1 tablet by mouth  daily. 09/23/19  Yes [provider]  meloxicam (MOBIC) 15 MG tablet Take 15 mg by mouth daily. 10/02/19  Yes [provider]  metFORMIN (GLUCOPHAGE) 1000 MG tablet Take 1,000 mg by mouth 2 (two) times daily with a meal.   Yes [provider]  omeprazole (PRILOSEC) 40 MG capsule Take 40 mg by mouth 2 (two) times daily. 10/09/19  Yes [provider]  phentermine (ADIPEX-P) 37.5 MG tablet Take 37.5 mg by mouth  daily. 10/02/19  Yes [provider]  rosuvastatin (CRESTOR) 5 MG tablet Take 5 mg by mouth daily.   Yes [provider]  spironolactone (ALDACTONE) 25 MG tablet Take 25 mg by mouth daily.   Yes [provider]  zolpidem (AMBIEN) 5 MG tablet Take 5 mg by mouth at bedtime. 09/21/19  Yes [provider]  magic mouthwash SOLN Take 5 mLs by mouth 4 (four) times daily as needed for mouth pain.    [provider]    Family History No family history on file.  Social History Social History   Tobacco Use  . Smoking status: Former Smoker    Packs/day: 0.50    Years: 5.00    Pack years: 2.50    Types: Cigarettes    Quit date: 07/05/1994    Years since quitting: 25.2  . Smokeless tobacco: Never Used  Substance Use Topics  . Alcohol use: No  . Drug use: No     Allergies   Patient has no known allergies.   Review of Systems Review of Systems  Constitutional: Positive for fatigue. Negative for chills and fever.  HENT: Negative for sore throat and trouble swallowing.   Eyes: Negative for visual disturbance.  Respiratory: Negative for cough and shortness of breath.   Cardiovascular: Negative for chest pain, palpitations and leg swelling.  Gastrointestinal: Negative for abdominal pain, constipation, diarrhea, nausea and vomiting.  Genitourinary: Positive for frequency. Negative for dysuria and flank pain.  Musculoskeletal: Negative for back pain, myalgias, neck pain and neck stiffness.  Skin: Negative for rash and wound.  Neurological: Positive for headaches. Negative for dizziness, syncope, weakness, light-headedness and numbness.  All other systems reviewed and are negative.    Physical Exam Updated Vital Signs BP 124/74   Pulse 84   Temp 98.4 F (36.9 C) (Oral)   Resp 18   Ht 5' 7.5" (1.715 m)   Wt (!) 138.3 kg   SpO2 99%   BMI 47.06 kg/m   Physical Exam Vitals signs and nursing note reviewed.  Constitutional:      General:  She is not in acute distress.    Appearance: Normal appearance. She is well-developed. She is not ill-appearing.  HENT:     Head: Normocephalic and atraumatic.     Nose: Nose normal.     Mouth/Throat:     Mouth: Mucous membranes are moist.  Eyes:     Extraocular Movements: Extraocular movements intact.     Pupils: Pupils are equal, round, and reactive to light.  Neck:     Musculoskeletal: Normal range of motion and neck supple. No neck rigidity or muscular tenderness.  Cardiovascular:     Rate and Rhythm: Normal rate and regular rhythm.     Heart sounds: No murmur. No friction rub. No gallop.   Pulmonary:     Effort: Pulmonary effort is normal. No respiratory distress.     Breath sounds: Normal breath sounds. No stridor. No wheezing, rhonchi or rales.  Chest:  Chest wall: No tenderness.  Abdominal:     General: Bowel sounds are normal.     Palpations: Abdomen is soft.     Tenderness: There is no abdominal tenderness. There is no guarding or rebound.  Musculoskeletal: Normal range of motion.        General: No swelling, tenderness, deformity or signs of injury.     Right lower leg: No edema.     Left lower leg: No edema.  Lymphadenopathy:     Cervical: No cervical adenopathy.  Skin:    General: Skin is warm and dry.     Findings: No erythema or rash.  Neurological:     General: No focal deficit present.     Mental Status: She is alert and oriented to person, place, and time.     Comments: Moving all extremities without focal deficit.  Sensation fully intact.  Psychiatric:        Mood and Affect: Mood normal.        Behavior: Behavior normal.      ED Treatments / Results  Labs (all labs ordered are listed, but only abnormal results are displayed) Labs Reviewed  COMPREHENSIVE METABOLIC PANEL - Abnormal; Notable for the following components:      Result Value   Sodium 127 (*)    Chloride 90 (*)    Glucose, Bld 526 (*)    Creatinine, Ser 1.68 (*)    GFR calc non Af  Amer 34 (*)    GFR calc Af Amer 40 (*)    All other components within normal limits  CBG MONITORING, ED - Abnormal; Notable for the following components:   Glucose-Capillary 542 (*)    All other components within normal limits  CBG MONITORING, ED - Abnormal; Notable for the following components:   Glucose-Capillary 445 (*)    All other components within normal limits  CBG MONITORING, ED - Abnormal; Notable for the following components:   Glucose-Capillary 343 (*)    All other components within normal limits  CBC WITH DIFFERENTIAL/PLATELET  URINALYSIS, ROUTINE W REFLEX MICROSCOPIC    EKG EKG Interpretation  Date/Time:  Wednesday October 16 2019 16:39:03 EDT Ventricular Rate:  76 PR Interval:    QRS Duration: 95 QT Interval:  365 QTC Calculation: 411 R Axis:   39 Text Interpretation:  Sinus rhythm Low voltage, extremity and precordial leads Consider anterior infarct Confirmed by Loren Racer ([id number]) on 10/16/2019 4:45:45 PM   Radiology No results found.  Procedures Procedures (including critical care time)  Medications Ordered in ED Medications  alum & mag hydroxide-simeth (MAALOX/MYLANTA) 200-200-20 MG/5ML suspension 30 mL (has no administration in time range)    And  lidocaine (XYLOCAINE) 2 % viscous mouth solution 15 mL (has no administration in time range)  sodium chloride 0.9 % bolus 2,000 mL (0 mLs Intravenous Stopped 10/16/19 1904)  insulin aspart (novoLOG) injection 10 Units (10 Units Subcutaneous Given 10/16/19 1631)     Initial Impression / Assessment and Plan / ED Course  I have reviewed the triage vital signs and the nursing notes.  Pertinent labs & imaging results that were available during my care of the patient were reviewed by me and considered in my medical decision making (see chart for details).        Patient presents with hyperglycemia due to noncompliance with insulin regimen.  Likely dehydrated.  Will give IV fluids and insulin.  She is  encouraged to take her medications as they are prescribed. Patient has a bump  in her creatinine likely related to dehydration.  Given 2 L of IV fluids in the emergency department states she is feeling better.  Vital signs remained stable.  She will follow-up with her physician in 1 week to have labs redrawn.  Return precautions have been given. Final Clinical Impressions(s) / ED Diagnoses   Final diagnoses:  Hyperglycemia  Dehydration  Renal insufficiency    ED Discharge Orders    None       Julianne Rice, MD 10/16/19 1944

## 2019-10-16 NOTE — ED Notes (Signed)
Date and time results received: 10/16/19 5:24 PM   Test: glucose Critical Value: 526  Name of Provider Notified: dr Lita Mains  Orders Received? Or Actions Taken?:

## 2019-10-16 NOTE — ED Triage Notes (Signed)
Pt has been using decreased dosages of her insulin for the past month due to the cost of the insulin pen. Pt went to see her doctor today and reports her blood sugar was almost 700.

## 2019-10-22 DIAGNOSIS — Z68.41 Body mass index (BMI) pediatric, 5th percentile to less than 85th percentile for age: Secondary | ICD-10-CM | POA: Diagnosis not present

## 2019-10-22 DIAGNOSIS — N182 Chronic kidney disease, stage 2 (mild): Secondary | ICD-10-CM | POA: Diagnosis not present

## 2019-10-22 DIAGNOSIS — N179 Acute kidney failure, unspecified: Secondary | ICD-10-CM | POA: Diagnosis not present

## 2019-10-22 DIAGNOSIS — E1165 Type 2 diabetes mellitus with hyperglycemia: Secondary | ICD-10-CM | POA: Diagnosis not present

## 2019-10-22 DIAGNOSIS — E1122 Type 2 diabetes mellitus with diabetic chronic kidney disease: Secondary | ICD-10-CM | POA: Diagnosis not present

## 2019-10-22 DIAGNOSIS — I10 Essential (primary) hypertension: Secondary | ICD-10-CM | POA: Diagnosis not present

## 2019-10-29 DIAGNOSIS — Z68.41 Body mass index (BMI) pediatric, 5th percentile to less than 85th percentile for age: Secondary | ICD-10-CM | POA: Diagnosis not present

## 2019-10-29 DIAGNOSIS — N182 Chronic kidney disease, stage 2 (mild): Secondary | ICD-10-CM | POA: Diagnosis not present

## 2019-10-29 DIAGNOSIS — I10 Essential (primary) hypertension: Secondary | ICD-10-CM | POA: Diagnosis not present

## 2019-11-19 DIAGNOSIS — N182 Chronic kidney disease, stage 2 (mild): Secondary | ICD-10-CM | POA: Diagnosis not present

## 2019-11-19 DIAGNOSIS — Z68.41 Body mass index (BMI) pediatric, 5th percentile to less than 85th percentile for age: Secondary | ICD-10-CM | POA: Diagnosis not present

## 2019-11-19 DIAGNOSIS — I10 Essential (primary) hypertension: Secondary | ICD-10-CM | POA: Diagnosis not present

## 2019-12-25 ENCOUNTER — Other Ambulatory Visit: Payer: Self-pay

## 2019-12-25 ENCOUNTER — Ambulatory Visit: Payer: BC Managed Care – PPO | Attending: Internal Medicine

## 2019-12-25 DIAGNOSIS — Z20822 Contact with and (suspected) exposure to covid-19: Secondary | ICD-10-CM

## 2019-12-25 DIAGNOSIS — Z20828 Contact with and (suspected) exposure to other viral communicable diseases: Secondary | ICD-10-CM | POA: Diagnosis not present

## 2019-12-26 LAB — NOVEL CORONAVIRUS, NAA: SARS-CoV-2, NAA: NOT DETECTED

## 2020-05-19 ENCOUNTER — Telehealth: Payer: Self-pay | Admitting: Orthopedic Surgery

## 2020-05-19 NOTE — Telephone Encounter (Signed)
Patient called to inquire about as soon as possible appointment with Dr Romeo Apple; offered the next available; discussed that patient has been seen for both knees (Osteoarthritis) at [facility]; discussed obtaining copies of films. Decided to return to [facility] to be seen. Will call back if needs to schedule something different.

## 2021-02-19 ENCOUNTER — Other Ambulatory Visit (HOSPITAL_COMMUNITY): Payer: Self-pay | Admitting: Internal Medicine

## 2021-02-19 DIAGNOSIS — Z1231 Encounter for screening mammogram for malignant neoplasm of breast: Secondary | ICD-10-CM

## 2021-03-22 ENCOUNTER — Ambulatory Visit (HOSPITAL_COMMUNITY)
Admission: RE | Admit: 2021-03-22 | Discharge: 2021-03-22 | Disposition: A | Discharge status: Home / Self Care | Payer: Medicare Other | Source: Ambulatory Visit | Attending: Internal Medicine | Admitting: Internal Medicine

## 2021-03-22 ENCOUNTER — Other Ambulatory Visit: Payer: Self-pay

## 2021-03-22 DIAGNOSIS — Z1231 Encounter for screening mammogram for malignant neoplasm of breast: Secondary | ICD-10-CM | POA: Diagnosis not present

## 2021-04-18 IMAGING — MG MM DIGITAL SCREENING BILAT W/ TOMO AND CAD
8 of 15 series · 8 of 40 positions shown · non-contrast
Comparison: Previous exam(s).

CLINICAL DATA: Screening.

EXAM:
DIGITAL SCREENING BILATERAL MAMMOGRAM WITH TOMOSYNTHESIS AND CAD
TECHNIQUE: Bilateral screening digital craniocaudal and mediolateral oblique
mammograms were obtained. Bilateral screening digital breast
tomosynthesis was performed. The images were evaluated with
computer-aided detection.

[R CC synth-2D]
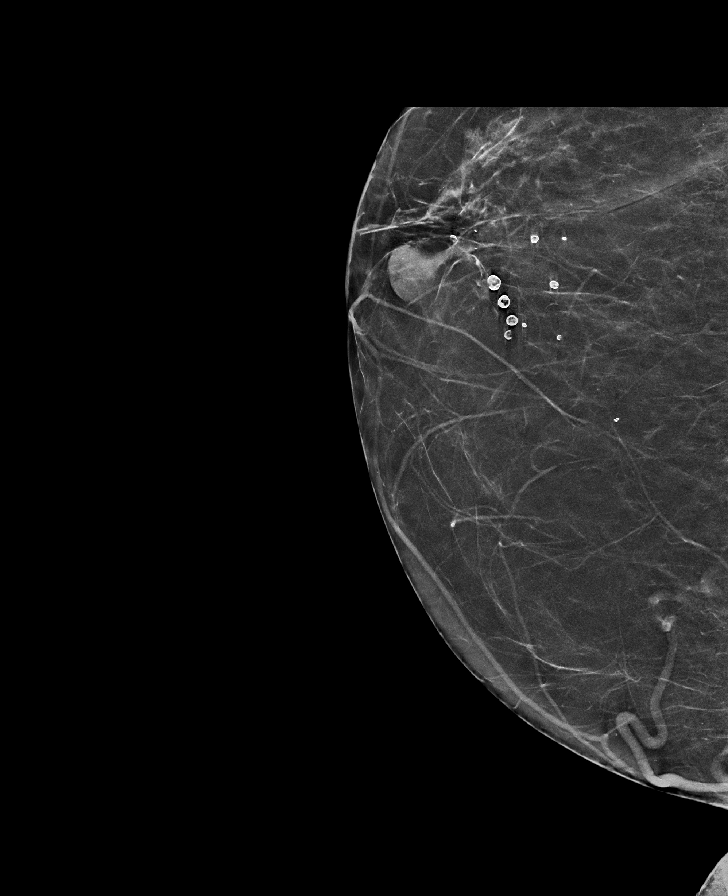

[L CC synth-2D (1 of 2)]
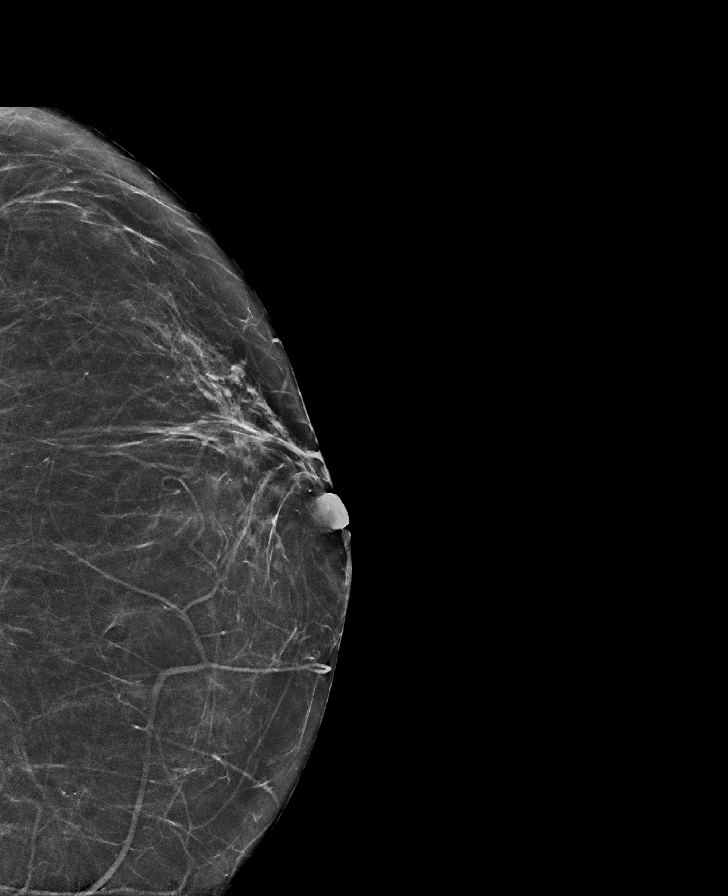

[L CC synth-2D (2 of 2)]
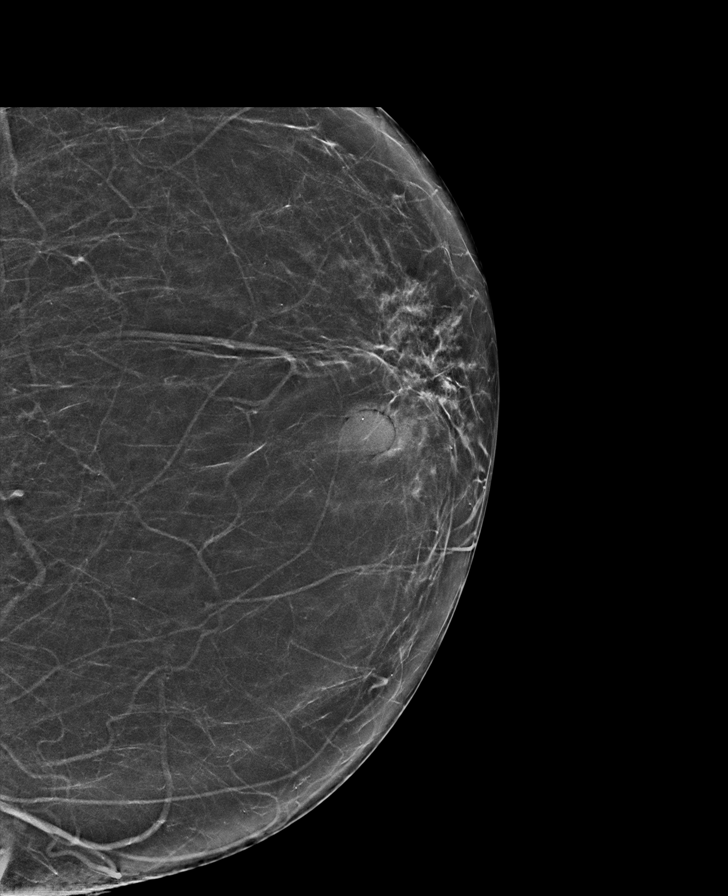

[L MLO synth-2D (1 of 2)]
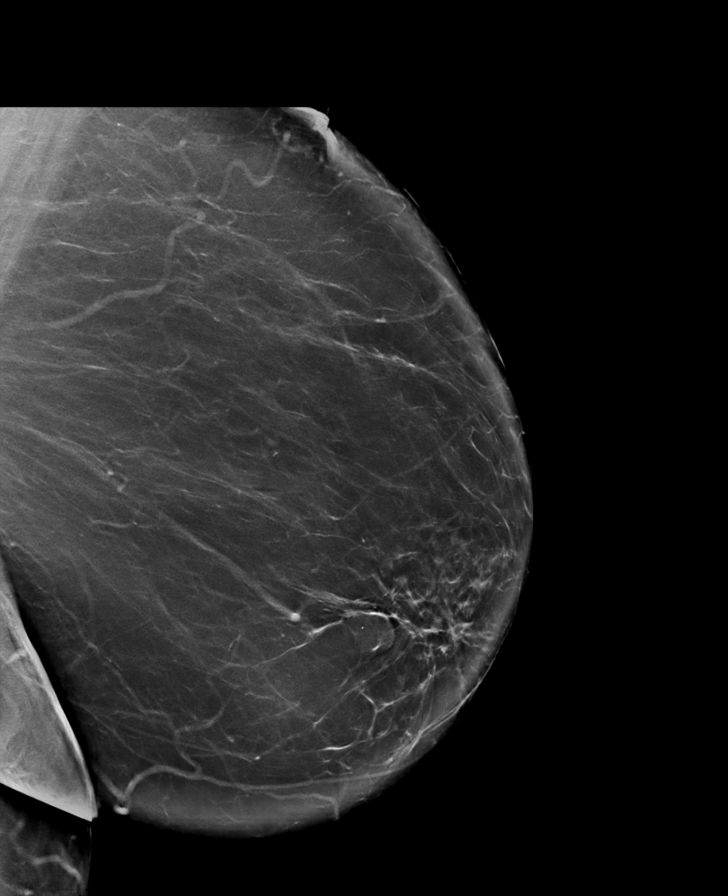

[L MLO synth-2D (2 of 2)]
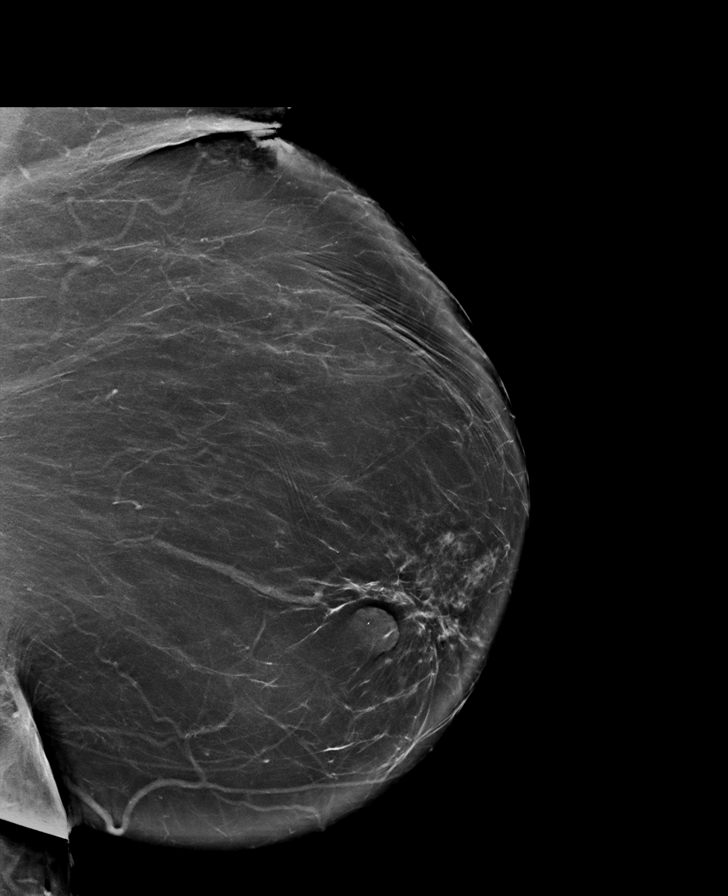

[R MLO synth-2D (1 of 2)]
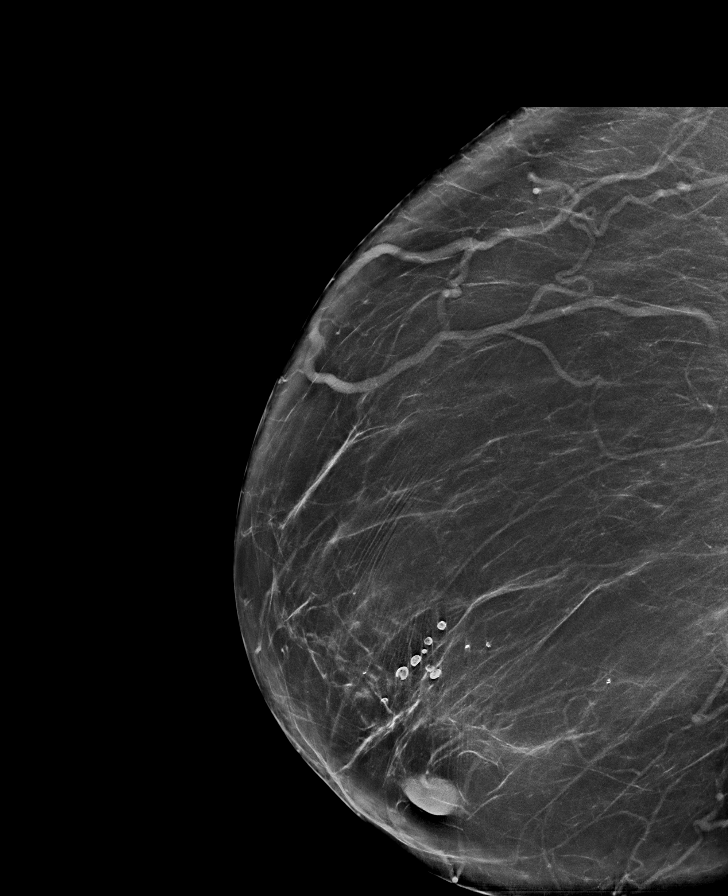

[R MLO synth-2D (2 of 2)]
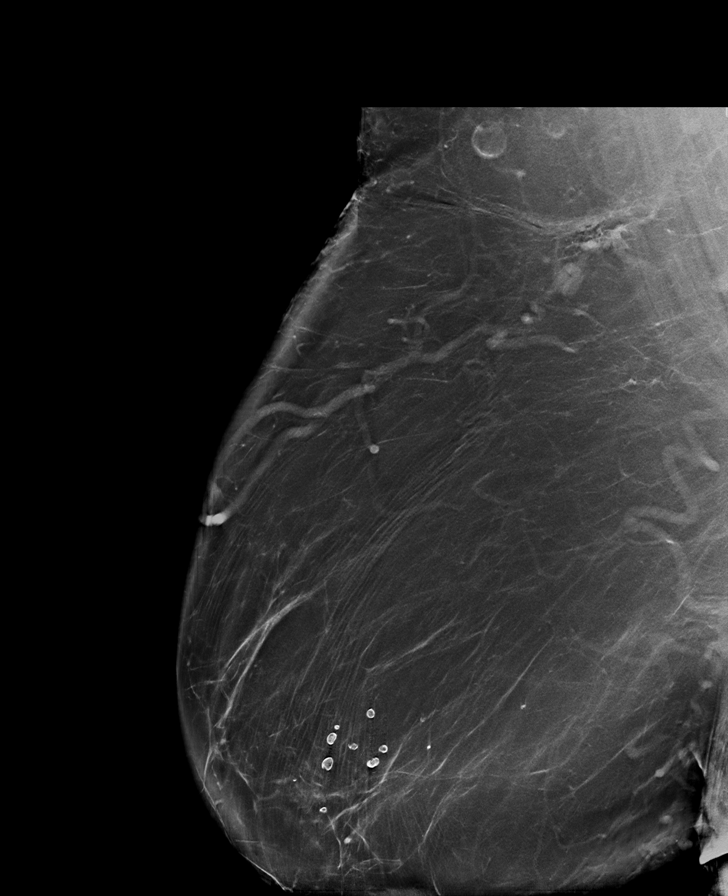

[R MLO tomo · tomo slice 77/113.0]
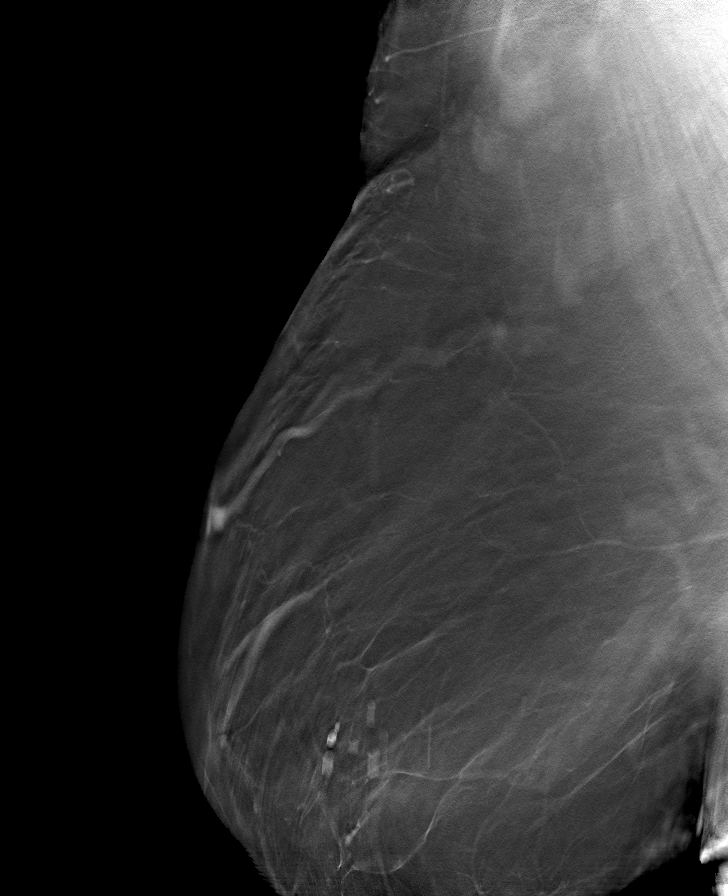

[8 of 40 positions shown; findings below may reference images not displayed]

ACR Breast Density Category b: There are scattered areas of
fibroglandular density.
FINDINGS: There are no findings suspicious for malignancy. The images were
evaluated with computer-aided detection.
IMPRESSION: No mammographic evidence of malignancy. A result letter of this
screening mammogram will be mailed directly to the patient.

RECOMMENDATION:
Screening mammogram in one year. (Code:WJ-I-BG6)

BI-RADS CATEGORY  1: Negative.

## 2021-06-24 ENCOUNTER — Ambulatory Visit: Payer: Self-pay

## 2021-06-24 ENCOUNTER — Ambulatory Visit (INDEPENDENT_AMBULATORY_CARE_PROVIDER_SITE_OTHER): Payer: Medicare Other

## 2021-06-24 ENCOUNTER — Other Ambulatory Visit: Payer: Self-pay

## 2021-06-24 ENCOUNTER — Ambulatory Visit (INDEPENDENT_AMBULATORY_CARE_PROVIDER_SITE_OTHER): Payer: Medicare Other | Admitting: Orthopedic Surgery

## 2021-06-24 DIAGNOSIS — M25561 Pain in right knee: Secondary | ICD-10-CM | POA: Diagnosis not present

## 2021-06-24 DIAGNOSIS — M25562 Pain in left knee: Secondary | ICD-10-CM | POA: Diagnosis not present

## 2021-06-24 DIAGNOSIS — G8929 Other chronic pain: Secondary | ICD-10-CM | POA: Diagnosis not present

## 2021-06-24 DIAGNOSIS — M1711 Unilateral primary osteoarthritis, right knee: Secondary | ICD-10-CM

## 2021-06-29 DIAGNOSIS — M1711 Unilateral primary osteoarthritis, right knee: Secondary | ICD-10-CM

## 2021-06-29 MED ORDER — METHYLPREDNISOLONE ACETATE 40 MG/ML IJ SUSP
40.0000 mg | INTRAMUSCULAR | Status: AC | PRN
  Administered 2021-06-29: 40 mg via INTRA_ARTICULAR

## 2021-06-29 MED ORDER — LIDOCAINE HCL (PF) 1 % IJ SOLN
5.0000 mL | INTRAMUSCULAR | Status: AC | PRN
  Administered 2021-06-29: 5 mL

## 2021-06-29 NOTE — Progress Notes (Signed)
Office Visit Note   Patient: Terri Logan           Date of Birth: 07/24/1966           MRN: [id number] Visit Date: 06/24/2021              Requested by: Benita Stabile, MD [address] [place],  [place] [address] PCP: Benita Stabile, MD  Chief Complaint  Patient presents with   Left Knee - Pain   Right Knee - Pain      HPI: Patient is a 55 year old woman who presents with osteoarthritis bilateral knees right worse than left.  She currently ambulates with a cane complains of bone-on-bone contact.  Patient states she is going to the [facility] is working with a Psychologist, educational for weight loss.  Patient states that her previous weight was 387 and currently 334.  Most recent hemoglobin A1c 8.5.  Assessment & Plan: Visit Diagnoses:  1. Chronic pain of both knees   2. Unilateral primary osteoarthritis, right knee     Plan: Right knee was injected she tolerated this well recommended continued weight loss continue efforts to get her A1c less than 8.  Risks and benefits of total knee arthroplasty was discussed with elevated A1c and elevated BMI.  Patient states she understands will follow-up as needed.  Follow-Up Instructions: Return if symptoms worsen or fail to improve.   Ortho Exam  Patient is alert, oriented, no adenopathy, well-dressed, normal affect, normal respiratory effort. Examination patient has an antalgic gait she uses a cane.  She has crepitation with range of motion of both knees varus alignment to the right knee collaterals and cruciates are stable bilaterally.  Imaging: No results found. No images are attached to the encounter.  Labs: Lab Results  Component Value Date   HGBA1C 8.6 07/22/2009     Lab Results  Component Value Date   ALBUMIN 3.6 10/16/2019   ALBUMIN 4.2 07/08/2009    No results found for: MG No results found for: VD25OH  No results found for: PREALBUMIN CBC EXTENDED Latest Ref Rng & Units 10/16/2019 10/13/2016 07/06/2015  WBC 4.0 - 10.5 K/uL  7.2 10.3 10.2  RBC 3.87 - 5.11 MIL/uL 4.66 4.62 4.45  HGB 12.0 - 15.0 g/dL 53.6 64.4 03.4  HCT 74.2 - 46.0 % 39.3 39.5 37.7  PLT 150 - 400 K/uL 286 355 377  NEUTROABS 1.7 - 7.7 K/uL 4.2 5.7 -  LYMPHSABS 0.7 - 4.0 K/uL 2.3 3.4 -     There is no height or weight on file to calculate BMI.  Orders:  Orders Placed This Encounter  Procedures   XR Knee 1-2 Views Right   XR Knee 1-2 Views Left   No orders of the defined types were placed in this encounter.    Procedures: Large Joint Inj: R knee on 06/29/2021 9:34 AM Indications: pain and diagnostic evaluation Details: 22 G 1.5 in needle, anteromedial approach  Arthrogram: No  Medications: 5 mL lidocaine (PF) 1 %; 40 mg methylPREDNISolone acetate 40 MG/ML Outcome: tolerated well, no immediate complications Procedure, treatment alternatives, risks and benefits explained, specific risks discussed. Consent was given by the patient. Immediately prior to procedure a time out was called to verify the correct patient, procedure, equipment, support staff and site/side marked as required. Patient was prepped and draped in the usual sterile fashion.     Clinical Data: No additional findings.  ROS:  All other systems negative, except as noted in the HPI.  Review of Systems  Objective: Vital Signs: There were no vitals taken for this visit.  Specialty Comments:  No specialty comments available.  PMFS History: Patient Active Problem List   Diagnosis Date Noted   Medial meniscus, posterior horn derangement, right    De Quervain's syndrome (tenosynovitis)    DIABETES MELLITUS, UNCONTROLLED 07/22/2009   ANKLE, ARTHRITIS, DEGEN./OSTEO 08/21/2008   ADJUSTMENT DISORDER WITH DEPRESSED MOOD 08/13/2008   ALLERGIC RHINITIS 05/15/2008   LEG PAIN, CHRONIC 10/05/2007   LEUKOCYTOSIS 04/05/2007   SORE THROAT 04/05/2007   SARCOIDOSIS 01/29/2007   OBESITY, MORBID 01/29/2007   OBSTRUCTIVE SLEEP APNEA 01/29/2007   MIGRAINE HEADACHE 01/29/2007    HYPERTENSION 01/29/2007   OSTEOARTHRITIS 01/29/2007   Past Medical History:  Diagnosis Date   Arthritis    Diabetes mellitus without complication (HCC)    Hypertension    Sarcoidosis    Sleep apnea     No family history on file.  Past Surgical History:  Procedure Laterality Date   CHOLECYSTECTOMY     15 years ago [facility]   DORSAL COMPARTMENT RELEASE Left 07/10/2015   Procedure: RELEASE DORSAL COMPARTMENT (DEQUERVAIN);  Surgeon: Vickki Hearing, MD;  Location: [facility];  Service: Orthopedics;  Laterality: Left;   KNEE ARTHROSCOPY WITH MEDIAL MENISECTOMY Right 10/20/2016   Procedure: KNEE ARTHROSCOPY WITH MEDIAL MENISECTOMY;  Surgeon: Vickki Hearing, MD;  Location: [facility];  Service: Orthopedics;  Laterality: Right;   ORIF ANKLE FRACTURE Right    Social History   Occupational History   Not on file  Tobacco Use   Smoking status: Former    Packs/day: 0.50    Years: 5.00    Pack years: 2.50    Types: Cigarettes    Quit date: 07/05/1994    Years since quitting: 27.0   Smokeless tobacco: Never  Substance and Sexual Activity   Alcohol use: No   Drug use: No   Sexual activity: Yes    Birth control/protection: Surgical

## 2021-09-16 ENCOUNTER — Encounter: Payer: Self-pay | Admitting: Orthopedic Surgery

## 2021-09-16 ENCOUNTER — Ambulatory Visit (INDEPENDENT_AMBULATORY_CARE_PROVIDER_SITE_OTHER): Payer: Medicare Other | Admitting: Physician Assistant

## 2021-09-16 VITALS — Ht 67.0 in | Wt 344.6 lb

## 2021-09-16 DIAGNOSIS — M25561 Pain in right knee: Secondary | ICD-10-CM | POA: Diagnosis not present

## 2021-09-16 DIAGNOSIS — G8929 Other chronic pain: Secondary | ICD-10-CM

## 2021-09-16 DIAGNOSIS — M25562 Pain in left knee: Secondary | ICD-10-CM

## 2021-09-16 NOTE — Progress Notes (Signed)
Office Visit Note   Patient: Terri Logan           Date of Birth: Jan 27, 1966           MRN: [id number] Visit Date: 09/16/2021              Requested by: Benita Stabile, MD [address] [place],  [place] [address] PCP: Benita Stabile, MD  Chief Complaint  Patient presents with   Left Knee - Pain   Right Knee - Pain      HPI: Patient presents today for follow-up on her right greater than left end-stage knee arthritis.  She has failed conservative treatments including injections and physical therapy and anti-inflammatories.  She has been working with a Systems analyst and has lost 40 pounds.  She has also reduced her hemoglobin A1c from 8.6-7.2.  She understands that there are bigger risk given her BMI.  She is extremely motivated and would like to go forward with right knee replacement.  Assessment & Plan: Visit Diagnoses: No diagnosis found.  Plan: Reviewed the risks and recovery from surgery.  She is going to keep working with her trainer to get her legs as strong as possible in the next month.  Risks of surgery include but are not limited to bleeding, infection, anesthesia complications, severe infection requiring above-knee amputation.  Blood clots.  She understands the recovery.  She also went appropriate with like to go forward with a left knee replacement  Follow-Up Instructions: No follow-ups on file.   Ortho Exam  Patient is alert, oriented, no adenopathy, well-dressed, normal affect, normal respiratory effort. Examination of her right knee she has no cellulitis or effusion.  She has significant grind and very little patellar mobility.  Tenderness over the medial lateral joint line skin is in good condition heart regular rate and rhythm lungs clear  Imaging: No results found. No images are attached to the encounter.  Labs: Lab Results  Component Value Date   HGBA1C 8.6 07/22/2009     Lab Results  Component Value Date   ALBUMIN 3.6 10/16/2019   ALBUMIN  4.2 07/08/2009    No results found for: MG No results found for: VD25OH  No results found for: PREALBUMIN CBC EXTENDED Latest Ref Rng & Units 10/16/2019 10/13/2016 07/06/2015  WBC 4.0 - 10.5 K/uL 7.2 10.3 10.2  RBC 3.87 - 5.11 MIL/uL 4.66 4.62 4.45  HGB 12.0 - 15.0 g/dL 08.8 11.0 31.5  HCT 94.5 - 46.0 % 39.3 39.5 37.7  PLT 150 - 400 K/uL 286 355 377  NEUTROABS 1.7 - 7.7 K/uL 4.2 5.7 -  LYMPHSABS 0.7 - 4.0 K/uL 2.3 3.4 -     Body mass index is 53.97 kg/m.  Orders:  No orders of the defined types were placed in this encounter.  No orders of the defined types were placed in this encounter.    Procedures: No procedures performed  Clinical Data: No additional findings.  ROS:  All other systems negative, except as noted in the HPI. Review of Systems  Objective: Vital Signs: Ht 5\' 7"  (1.702 m)   Wt (!) 344 lb 9.6 oz (156.3 kg)   BMI 53.97 kg/m   Specialty Comments:  No specialty comments available.  PMFS History: Patient Active Problem List   Diagnosis Date Noted   Medial meniscus, posterior horn derangement, right    De Quervain's syndrome (tenosynovitis)    DIABETES MELLITUS, UNCONTROLLED 07/22/2009   ANKLE, ARTHRITIS, DEGEN./OSTEO 08/21/2008   ADJUSTMENT  DISORDER WITH DEPRESSED MOOD 08/13/2008   ALLERGIC RHINITIS 05/15/2008   LEG PAIN, CHRONIC 10/05/2007   LEUKOCYTOSIS 04/05/2007   SORE THROAT 04/05/2007   SARCOIDOSIS 01/29/2007   OBESITY, MORBID 01/29/2007   OBSTRUCTIVE SLEEP APNEA 01/29/2007   MIGRAINE HEADACHE 01/29/2007   HYPERTENSION 01/29/2007   OSTEOARTHRITIS 01/29/2007   Past Medical History:  Diagnosis Date   Arthritis    Diabetes mellitus without complication (HCC)    Hypertension    Sarcoidosis    Sleep apnea     History reviewed. No pertinent family history.  Past Surgical History:  Procedure Laterality Date   CHOLECYSTECTOMY     15 years ago [facility]   DORSAL COMPARTMENT RELEASE Left 07/10/2015   Procedure: RELEASE DORSAL COMPARTMENT  (DEQUERVAIN);  Surgeon: Vickki Hearing, MD;  Location: [facility];  Service: Orthopedics;  Laterality: Left;   KNEE ARTHROSCOPY WITH MEDIAL MENISECTOMY Right 10/20/2016   Procedure: KNEE ARTHROSCOPY WITH MEDIAL MENISECTOMY;  Surgeon: Vickki Hearing, MD;  Location: [facility];  Service: Orthopedics;  Laterality: Right;   ORIF ANKLE FRACTURE Right    Social History   Occupational History   Not on file  Tobacco Use   Smoking status: Former    Packs/day: 0.50    Years: 5.00    Pack years: 2.50    Types: Cigarettes    Quit date: 07/05/1994    Years since quitting: 27.2   Smokeless tobacco: Never  Substance and Sexual Activity   Alcohol use: No   Drug use: No   Sexual activity: Yes    Birth control/protection: Surgical

## 2021-10-26 ENCOUNTER — Other Ambulatory Visit: Payer: Self-pay

## 2021-11-01 NOTE — Progress Notes (Addendum)
Surgical Instructions    Your procedure is scheduled on Friday, November 11th, 2022.   Report to [facility] Main Entrance "A" at 07:45 A.M., then check in with the Admitting office.  Call this number if you have problems the morning of surgery:  [phone]   If you have any questions prior to your surgery date call [phone]: Open Monday-Friday 8am-4pm    Remember:  Do not eat after midnight the night before your surgery  You may drink clear liquids until 06:45 the morning of your surgery.   Clear liquids allowed are: Water, Non-Citrus Juices (without pulp), Carbonated Beverages, Clear Tea, Black Coffee ONLY (NO MILK, CREAM OR POWDERED CREAMER of any kind), and Gatorade  Patient Instructions   The day of surgery (if you have diabetes): Drink ONE (1) 12 oz G2 given to you in your pre admission testing appointment by 06:45 the morning of surgery. Drink in one sitting. Do not sip.  This drink was given to you during your hospital  pre-op appointment visit.  Nothing else to drink after completing the  12 oz bottle of G2.         If you have questions, please contact your surgeon's office.     Take these medicines the morning of surgery with A SIP OF WATER:  amLODipine (NORVASC) cyclobenzaprine (FLEXERIL)  gabapentin (NEURONTIN)  omeprazole (PRILOSEC)   If needed:   albuterol (VENTOLIN HFA) - please, bring the inhaler with you the day of surgery HYDROcodone-acetaminophen (NORCO)   As of today, STOP taking any Aspirin (unless otherwise instructed by your surgeon) Aleve, Naproxen, Ibuprofen, Motrin, Advil, Mobic, Goody's, BC's, all herbal medications, fish oil, and all vitamins.   WHAT DO I DO ABOUT MY DIABETES MEDICATION?   Do not take metFORMIN (GLUCOPHAGE) the morning of surgery.  Do not take Semaglutide, the day of surgery  Do not take NOVOLOG FLEXPEN the day of surgery  THE NIGHT BEFORE SURGERY, take 25 units of insulin degludec (TRESIBA FLEXTOUCH) (50% of  your regular dose)      THE MORNING OF SURGERY,  take 25 units of insulin degludec (TRESIBA FLEXTOUCH) (50% of your regular dose)       HOW TO MANAGE YOUR DIABETES BEFORE AND AFTER SURGERY  Why is it important to control my blood sugar before and after surgery? Improving blood sugar levels before and after surgery helps healing and can limit problems. A way of improving blood sugar control is eating a healthy diet by:  Eating less sugar and carbohydrates  Increasing activity/exercise  Talking with your doctor about reaching your blood sugar goals High blood sugars (greater than 180 mg/dL) can raise your risk of infections and slow your recovery, so you will need to focus on controlling your diabetes during the weeks before surgery. Make sure that the doctor who takes care of your diabetes knows about your planned surgery including the date and location.  How do I manage my blood sugar before surgery? Check your blood sugar at least 4 times a day, starting 2 days before surgery, to make sure that the level is not too high or low.  Check your blood sugar the morning of your surgery when you wake up and every 2 hours until you get to the Short Stay unit.  If your blood sugar is less than 70 mg/dL, you will need to treat for low blood sugar: Do not take insulin. Treat a low blood sugar (less than 70 mg/dL) with  cup of clear juice (cranberry  or apple), 4 glucose tablets, OR glucose gel. Recheck blood sugar in 15 minutes after treatment (to make sure it is greater than 70 mg/dL). If your blood sugar is not greater than 70 mg/dL on recheck, call [phone] for further instructions. Report your blood sugar to the short stay nurse when you get to Short Stay.  If you are admitted to the hospital after surgery: Your blood sugar will be checked by the staff and you will probably be given insulin after surgery (instead of oral diabetes medicines) to make sure you have good blood sugar  levels. The goal for blood sugar control after surgery is 80-180 mg/dL.    After your COVID test   You are not required to quarantine however you are required to wear a well-fitting mask when you are out and around people not in your household.  If your mask becomes wet or soiled, replace with a new one.  Wash your hands often with soap and water for 20 seconds or clean your hands with an alcohol-based hand sanitizer that contains at least 60% alcohol.  Do not share personal items.  Notify your provider: if you are in close contact with someone who has COVID  or if you develop a fever of 100.4 or greater, sneezing, cough, sore throat, shortness of breath or body aches.    The day of surgery:          Do not wear jewelry or makeup Do not wear lotions, powders, perfumes, or deodorant. Do not shave 48 hours prior to surgery.   Do not bring valuables to the hospital. DO Not wear nail polish, gel polish, artificial nails, or any other type of covering on natural nails including finger and toenails. If patients have artificial nails, gel coating, etc. that need to be removed by a nail salon, please have this removed prior to surgery or surgery may need to be canceled/delayed if the surgeon/ anesthesia feels like the patient is unable to be adequately monitored.              [facility] is not responsible for any belongings or valuables.  Do NOT Smoke (Tobacco/Vaping)  24 hours prior to your procedure  If you use a CPAP at night, you may bring your mask for your overnight stay.   Contacts, glasses, hearing aids, dentures or partials may not be worn into surgery, please bring cases for these belongings   For patients admitted to the hospital, discharge time will be determined by your treatment team.   Patients discharged the day of surgery will not be allowed to drive home, and someone needs to stay with them for 24 hours.  NO VISITORS WILL BE ALLOWED IN PRE-OP WHERE PATIENTS ARE  PREPPED FOR SURGERY.  ONLY 1 SUPPORT PERSON MAY BE PRESENT IN THE WAITING ROOM WHILE YOU ARE IN SURGERY.  IF YOU ARE TO BE ADMITTED, ONCE YOU ARE IN YOUR ROOM YOU WILL BE ALLOWED TWO (2) VISITORS. 1 (ONE) VISITOR MAY STAY OVERNIGHT BUT MUST ARRIVE TO THE ROOM BY 8pm.  Minor children may have two parents present. Special consideration for safety and communication needs will be reviewed on a case by case basis.  Special instructions:    Oral Hygiene is also important to reduce your risk of infection.  Remember - BRUSH YOUR TEETH THE MORNING OF SURGERY WITH YOUR REGULAR TOOTHPASTE   - Preparing For Surgery  Before surgery, you can play an important role. Because skin is not sterile, your  skin needs to be as free of germs as possible. You can reduce the number of germs on your skin by washing with CHG (chlorahexidine gluconate) Soap before surgery.  CHG is an antiseptic cleaner which kills germs and bonds with the skin to continue killing germs even after washing.     Please do not use if you have an allergy to CHG or antibacterial soaps. If your skin becomes reddened/irritated stop using the CHG.  Do not shave (including legs and underarms) for at least 48 hours prior to first CHG shower. It is OK to shave your face.  Please follow these instructions carefully.     Shower the NIGHT BEFORE SURGERY and the MORNING OF SURGERY with CHG Soap.   If you chose to wash your hair, wash your hair first as usual with your normal shampoo. After you shampoo, rinse your hair and body thoroughly to remove the shampoo.  Then [employer] and genitals (private parts) with your normal soap and rinse thoroughly to remove soap.  After that Use CHG Soap as you would any other liquid soap. You can apply CHG directly to the skin and wash gently with a scrungie or a clean washcloth.   Apply the CHG Soap to your body ONLY FROM THE NECK DOWN.  Do not use on open wounds or open sores. Avoid contact with your eyes,  ears, mouth and genitals (private parts). Wash Face and genitals (private parts)  with your normal soap.   Wash thoroughly, paying special attention to the area where your surgery will be performed.  Thoroughly rinse your body with warm water from the neck down.  DO NOT shower/wash with your normal soap after using and rinsing off the CHG Soap.  Pat yourself dry with a CLEAN TOWEL.  Wear CLEAN PAJAMAS to bed the night before surgery  Place CLEAN SHEETS on your bed the night before your surgery  DO NOT SLEEP WITH PETS.   Day of Surgery:  Take a shower with CHG soap. Wear Clean/Comfortable clothing the morning of surgery Do not apply any deodorants/lotions.   Remember to brush your teeth WITH YOUR REGULAR TOOTHPASTE.   Please read over the following fact sheets that you were given.

## 2021-11-02 ENCOUNTER — Encounter (HOSPITAL_COMMUNITY): Payer: Self-pay

## 2021-11-02 ENCOUNTER — Other Ambulatory Visit: Payer: Self-pay

## 2021-11-02 ENCOUNTER — Encounter (HOSPITAL_COMMUNITY)
Admission: RE | Admit: 2021-11-02 | Discharge: 2021-11-02 | Disposition: A | Discharge status: Home / Self Care | Payer: Medicare Other | Source: Ambulatory Visit | Attending: Orthopedic Surgery | Admitting: Orthopedic Surgery

## 2021-11-02 VITALS — BP 137/69 | HR 71 | Temp 98.4°F | Resp 18 | Ht 67.0 in | Wt 345.2 lb

## 2021-11-02 DIAGNOSIS — I10 Essential (primary) hypertension: Secondary | ICD-10-CM | POA: Diagnosis not present

## 2021-11-02 DIAGNOSIS — Z6841 Body Mass Index (BMI) 40.0 and over, adult: Secondary | ICD-10-CM | POA: Insufficient documentation

## 2021-11-02 DIAGNOSIS — D869 Sarcoidosis, unspecified: Secondary | ICD-10-CM | POA: Insufficient documentation

## 2021-11-02 DIAGNOSIS — E0865 Diabetes mellitus due to underlying condition with hyperglycemia: Secondary | ICD-10-CM

## 2021-11-02 DIAGNOSIS — Z01818 Encounter for other preprocedural examination: Secondary | ICD-10-CM | POA: Insufficient documentation

## 2021-11-02 DIAGNOSIS — Z87891 Personal history of nicotine dependence: Secondary | ICD-10-CM | POA: Diagnosis not present

## 2021-11-02 DIAGNOSIS — G4733 Obstructive sleep apnea (adult) (pediatric): Secondary | ICD-10-CM | POA: Diagnosis not present

## 2021-11-02 DIAGNOSIS — Z9049 Acquired absence of other specified parts of digestive tract: Secondary | ICD-10-CM | POA: Insufficient documentation

## 2021-11-02 DIAGNOSIS — Z20822 Contact with and (suspected) exposure to covid-19: Secondary | ICD-10-CM | POA: Diagnosis not present

## 2021-11-02 DIAGNOSIS — E119 Type 2 diabetes mellitus without complications: Secondary | ICD-10-CM | POA: Diagnosis not present

## 2021-11-02 DIAGNOSIS — M1711 Unilateral primary osteoarthritis, right knee: Secondary | ICD-10-CM | POA: Diagnosis not present

## 2021-11-02 DIAGNOSIS — K219 Gastro-esophageal reflux disease without esophagitis: Secondary | ICD-10-CM | POA: Diagnosis not present

## 2021-11-02 HISTORY — DX: Gastro-esophageal reflux disease without esophagitis: K21.9

## 2021-11-02 LAB — BASIC METABOLIC PANEL
Anion gap: 11 (ref 5–15)
BUN: 18 mg/dL (ref 6–20)
CO2: 23 mmol/L (ref 22–32)
Calcium: 9 mg/dL (ref 8.9–10.3)
Chloride: 101 mmol/L (ref 98–111)
Creatinine, Ser: 1.19 mg/dL — ABNORMAL HIGH (ref 0.44–1.00)
GFR, Estimated: 54 mL/min — ABNORMAL LOW (ref >60)
Glucose, Bld: 135 mg/dL — ABNORMAL HIGH (ref 70–99)
Potassium: 4.5 mmol/L (ref 3.5–5.1)
Sodium: 135 mmol/L (ref 135–145)

## 2021-11-02 LAB — CBC
HCT: 36.5 % (ref 36.0–46.0)
Hemoglobin: 11.6 g/dL — ABNORMAL LOW (ref 12.0–15.0)
MCH: 27.6 pg (ref 26.0–34.0)
MCHC: 31.8 g/dL (ref 30.0–36.0)
MCV: 86.7 fL (ref 80.0–100.0)
Platelets: 312 10*3/uL (ref 150–400)
RBC: 4.21 MIL/uL (ref 3.87–5.11)
RDW: 15.3 % (ref 11.5–15.5)
WBC: 9.3 10*3/uL (ref 4.0–10.5)
nRBC: 0 % (ref 0.0–0.2)

## 2021-11-02 LAB — SURGICAL PCR SCREEN
MRSA, PCR: NEGATIVE
Staphylococcus aureus: NEGATIVE

## 2021-11-02 LAB — SARS CORONAVIRUS 2 (TAT 6-24 HRS): SARS Coronavirus 2: NEGATIVE

## 2021-11-02 LAB — HEMOGLOBIN A1C
Hgb A1c MFr Bld: 7.6 % — ABNORMAL HIGH (ref 4.8–5.6)
Mean Plasma Glucose: 171.42 mg/dL

## 2021-11-02 LAB — GLUCOSE, CAPILLARY: Glucose-Capillary: 172 mg/dL — ABNORMAL HIGH (ref 70–99)

## 2021-11-02 NOTE — Progress Notes (Signed)
PCP - Terri Logan, AGNP Cardiologist - denies  PPM/ICD - denies Device Orders - n/a Rep Notified - n/a  Chest x-ray - n/a EKG - 11/02/2021 Stress Test - denies ECHO - denies Cardiac Cath - denies  Sleep Study - yes, positive CPAP - yea - 7  Fasting Blood Sugar - 125-150 Checks Blood Sugar 4 times a day CBG today - 172 A1C - done in PAT on 11/02/2021  Blood Thinner Instructions: n/a  Aspirin Instructions: Patient was instructed: As of today, STOP taking any Aspirin (unless otherwise instructed by your surgeon) Aleve, Naproxen, Ibuprofen, Motrin, Advil, Mobic, Goody's, BC's, all herbal medications, fish oil, and all vitamins.    ERAS Protcol - yes PRE-SURGERY G2- yes  COVID TEST- yes, done in PAT on 11/02/2021   Anesthesia review: yes, abnormal EKG in PAT  Patient denies shortness of breath, fever, cough and chest pain at PAT appointment   All instructions explained to the patient, with a verbal understanding of the material. Patient agrees to go over the instructions while at home for a better understanding. Patient also instructed to self quarantine after being tested for COVID-19. The opportunity to ask questions was provided.

## 2021-11-03 NOTE — Anesthesia Preprocedure Evaluation (Addendum)
Anesthesia Evaluation  Patient identified by MRN, date of birth, ID band Patient awake    Reviewed: Allergy & Precautions, NPO status , Patient's Chart, lab work & pertinent test results  Airway Mallampati: II  TM Distance: >3 FB Neck ROM: Full    Dental no notable dental hx. (+) Teeth Intact, Dental Advisory Given   Pulmonary sleep apnea and Continuous Positive Airway Pressure Ventilation , former smoker,    Pulmonary exam normal breath sounds clear to auscultation       Cardiovascular hypertension, Pt. on medications Normal cardiovascular exam Rhythm:Regular Rate:Normal     Neuro/Psych  Headaches,    GI/Hepatic GERD  Controlled and Medicated,  Endo/Other  diabetes, Type 2, Oral Hypoglycemic AgentsMorbid obesity (BMI 54.07)  Renal/GU Lab Results      Component                Value               Date                      CREATININE               1.19 (H)            11/02/2021                BUN                      18                  11/02/2021                NA                       135                 11/02/2021                K                        4.5                 11/02/2021                CL                       101                 11/02/2021                CO2                      23                  11/02/2021                Musculoskeletal  (+) Arthritis ,   Abdominal (+) + obese,   Peds  Hematology Lab Results      Component                Value               Date                      WBC  9.3                 11/02/2021                HGB                      11.6 (L)            11/02/2021                HCT                      36.5                11/02/2021                MCV                      86.7                11/02/2021                PLT                      312                 11/02/2021              Anesthesia Other Findings Pt w Sarcoidosis  NKA   Reproductive/Obstetrics                           Anesthesia Physical Anesthesia Plan  ASA: 3  Anesthesia Plan: Spinal   Post-op Pain Management:  Regional for Post-op pain   Induction:   PONV Risk Score and Plan: 3 and Treatment may vary due to age or medical condition, Midazolam and Ondansetron  Airway Management Planned: Nasal Cannula and Natural Airway  Additional Equipment: None  Intra-op Plan:   Post-operative Plan:   Informed Consent: I have reviewed the patients History and Physical, chart, labs and discussed the procedure including the risks, benefits and alternatives for the proposed anesthesia with the patient or authorized representative who has indicated his/her understanding and acceptance.     Dental advisory given  Plan Discussed with: CRNA and Anesthesiologist  Anesthesia Plan Comments: (PAT note written 11/03/2021 by Shonna Chock, PA-C.  R Adductor canal plus Spinal )      Anesthesia Quick Evaluation

## 2021-11-03 NOTE — Progress Notes (Signed)
Anesthesia Chart Review:  Case: [id number] Date/Time: 11/05/21 0929   Procedure: RIGHT TOTAL KNEE ARTHROPLASTY (Right: Knee)   Anesthesia type: Choice   Pre-op diagnosis: Osteoarthritis Right Knee   [facility] ROOM 05 / [facility] OR   Surgeons: Nadara Mustard, MD       DISCUSSION: Patient is a 55 year old female scheduled for the above procedure.  She made improvement in her diabetes control and had lost ~ 40 pounds working with a Systems analyst and [facility] prior to scheduling surgery.  History includes former smoker (quit 07/05/94), DM2, HTN, OSA, GERD, Sarcoidosis, cholecystectomy. BMI is consistent with morbid obesity.    11/02/2021 presurgical COVID-19 test negative.  Anesthesia team to evaluate on the day of surgery   VS: BP 137/69   Pulse 71   Temp 36.9 C (Oral)   Resp 18   Ht 5\' 7"  (1.702 m)   Wt (!) 156.6 kg   SpO2 100%   BMI 54.07 kg/m    PROVIDERS: , NP is PCP ([facility]), established 10/11/21. Previously, PCP listed as 10/13/21, MD.    LABS: Labs reviewed: Acceptable for surgery.  Labs routed to Dr. Benita Stabile for his review. AST 17 , ALT 23 07/10/21 ([facility]). (all labs ordered are listed, but only abnormal results are displayed)  Labs Reviewed  GLUCOSE, CAPILLARY - Abnormal; Notable for the following components:      Result Value   Glucose-Capillary 172 (*)    All other components within normal limits  BASIC METABOLIC PANEL - Abnormal; Notable for the following components:   Glucose, Bld 135 (*)    Creatinine, Ser 1.19 (*)    GFR, Estimated 54 (*)    All other components within normal limits  CBC - Abnormal; Notable for the following components:   Hemoglobin 11.6 (*)    All other components within normal limits  HEMOGLOBIN A1C - Abnormal; Notable for the following components:   Hgb A1c MFr Bld 7.6 (*)    All other components within normal limits  SURGICAL PCR SCREEN  SARS CORONAVIRUS 2 (TAT 6-24 HRS)    Sleep  Study 10/24/05: IMPRESSION: A very severe obstructive sleep apnea/hypopnea syndrome with a respiratory disturbance index of 92 events during the first part of the study prior to C-PAP initiation... CPAP Titration Study 11/21/05: Good control of previously documented obstructive sleep apnea with 9 cm of water pressure via large nasal pillows.   EKG: 11/02/21: Normal sinus rhythm Low voltage QRS Cannot rule out Anterior infarct , age undetermined   CV: N/A   Past Medical History:  Diagnosis Date   Arthritis    Diabetes mellitus without complication (HCC)    GERD (gastroesophageal reflux disease)    Hypertension    Sarcoidosis    Sleep apnea     Past Surgical History:  Procedure Laterality Date   CHOLECYSTECTOMY     15 years ago [facility]   DORSAL COMPARTMENT RELEASE Left 07/10/2015   Procedure: RELEASE DORSAL COMPARTMENT (DEQUERVAIN);  Surgeon: 07/12/2015, MD;  Location: [facility] ORS;  Service: Orthopedics;  Laterality: Left;   KNEE ARTHROSCOPY WITH MEDIAL MENISECTOMY Right 10/20/2016   Procedure: KNEE ARTHROSCOPY WITH MEDIAL MENISECTOMY;  Surgeon: 10/22/2016, MD;  Location: [facility] ORS;  Service: Orthopedics;  Laterality: Right;   ORIF ANKLE FRACTURE Right     MEDICATIONS:  albuterol (VENTOLIN HFA) 108 (90 Base) MCG/ACT inhaler   amLODipine (NORVASC) 10 MG tablet   cholecalciferol (VITAMIN D) 1000 units tablet  cyclobenzaprine (FLEXERIL) 10 MG tablet   gabapentin (NEURONTIN) 300 MG capsule   hydrochlorothiazide (HYDRODIURIL) 25 MG tablet   HYDROcodone-acetaminophen (NORCO) 10-325 MG tablet   insulin degludec (TRESIBA FLEXTOUCH) 200 UNIT/ML FlexTouch Pen   irbesartan (AVAPRO) 150 MG tablet   meloxicam (MOBIC) 15 MG tablet   Menthol, Topical Analgesic, (ASPERCREME MAX ROLL-ON EX)   metFORMIN (GLUCOPHAGE) 1000 MG tablet   NOVOLOG FLEXPEN 100 UNIT/ML FlexPen   omeprazole (PRILOSEC) 40 MG capsule   rosuvastatin (CRESTOR) 5 MG tablet   Semaglutide, 2 MG/DOSE, (OZEMPIC, 2  MG/DOSE,) 8 MG/3ML SOPN   spironolactone (ALDACTONE) 50 MG tablet   VITAMIN E PO   zinc gluconate 50 MG tablet   No current facility-administered medications for this encounter.    Shonna Chock, PA-C Surgical Short Stay/Anesthesiology [facility] Phone [phone] [facility] Phone [phone] 11/03/2021 11:41 AM

## 2021-11-04 MED ORDER — TRANEXAMIC ACID 1000 MG/10ML IV SOLN
2000.0000 mg | INTRAVENOUS | Status: DC
  Filled 2021-11-04: qty 20

## 2021-11-05 ENCOUNTER — Encounter (HOSPITAL_COMMUNITY): Payer: Self-pay | Admitting: Orthopedic Surgery

## 2021-11-05 ENCOUNTER — Encounter (HOSPITAL_COMMUNITY)
Admission: RE | Disposition: A | Discharge status: Home / Self Care | Payer: Self-pay | Source: Ambulatory Visit | Attending: Orthopedic Surgery

## 2021-11-05 ENCOUNTER — Observation Stay (HOSPITAL_COMMUNITY)
Admission: RE | Admit: 2021-11-05 | Discharge: 2021-11-08 | Disposition: A | Discharge status: Home / Self Care | Payer: Medicare Other | Source: Ambulatory Visit | Attending: Orthopedic Surgery | Admitting: Orthopedic Surgery

## 2021-11-05 ENCOUNTER — Ambulatory Visit (HOSPITAL_COMMUNITY): Payer: Medicare Other | Admitting: Anesthesiology

## 2021-11-05 ENCOUNTER — Ambulatory Visit (HOSPITAL_COMMUNITY): Payer: Medicare Other | Admitting: Vascular Surgery

## 2021-11-05 ENCOUNTER — Other Ambulatory Visit: Payer: Self-pay

## 2021-11-05 DIAGNOSIS — I10 Essential (primary) hypertension: Secondary | ICD-10-CM | POA: Diagnosis not present

## 2021-11-05 DIAGNOSIS — Z87891 Personal history of nicotine dependence: Secondary | ICD-10-CM | POA: Diagnosis not present

## 2021-11-05 DIAGNOSIS — M1711 Unilateral primary osteoarthritis, right knee: Principal | ICD-10-CM | POA: Insufficient documentation

## 2021-11-05 DIAGNOSIS — Z7984 Long term (current) use of oral hypoglycemic drugs: Secondary | ICD-10-CM | POA: Insufficient documentation

## 2021-11-05 DIAGNOSIS — Z79899 Other long term (current) drug therapy: Secondary | ICD-10-CM | POA: Insufficient documentation

## 2021-11-05 DIAGNOSIS — Z96651 Presence of right artificial knee joint: Secondary | ICD-10-CM

## 2021-11-05 DIAGNOSIS — G4733 Obstructive sleep apnea (adult) (pediatric): Secondary | ICD-10-CM | POA: Insufficient documentation

## 2021-11-05 DIAGNOSIS — E1165 Type 2 diabetes mellitus with hyperglycemia: Secondary | ICD-10-CM | POA: Diagnosis not present

## 2021-11-05 DIAGNOSIS — Z794 Long term (current) use of insulin: Secondary | ICD-10-CM | POA: Insufficient documentation

## 2021-11-05 HISTORY — PX: TOTAL KNEE ARTHROPLASTY: SHX125

## 2021-11-05 LAB — GLUCOSE, CAPILLARY
Glucose-Capillary: 188 mg/dL — ABNORMAL HIGH (ref 70–99)
Glucose-Capillary: 171 mg/dL — ABNORMAL HIGH (ref 70–99)
Glucose-Capillary: 174 mg/dL — ABNORMAL HIGH (ref 70–99)
Glucose-Capillary: 210 mg/dL — ABNORMAL HIGH (ref 70–99)

## 2021-11-05 SURGERY — ARTHROPLASTY, KNEE, TOTAL
Anesthesia: Spinal | Site: Knee | Laterality: Right

## 2021-11-05 MED ORDER — OXYCODONE HCL 5 MG PO TABS: 5.0000 mg | ORAL_TABLET | Freq: Once | ORAL | Status: DC | PRN

## 2021-11-05 MED ORDER — ONDANSETRON HCL 4 MG/2ML IJ SOLN
4.0000 mg | Freq: Four times a day (QID) | INTRAMUSCULAR | Status: DC | PRN
  Administered 2021-11-07: 4 mg via INTRAVENOUS
  Filled 2021-11-05 (×2): qty 2

## 2021-11-05 MED ORDER — BISACODYL 10 MG RE SUPP: 10.0000 mg | Freq: Every day | RECTAL | Status: DC | PRN

## 2021-11-05 MED ORDER — OXYCODONE HCL 5 MG PO TABS
10.0000 mg | ORAL_TABLET | ORAL | Status: DC | PRN
  Administered 2021-11-06 – 2021-11-07 (×4): 15 mg via ORAL
  Administered 2021-11-08 (×2): 10 mg via ORAL
  Filled 2021-11-05 (×3): qty 3
  Filled 2021-11-05 (×2): qty 2
  Filled 2021-11-05 (×2): qty 3
  Filled 2021-11-05: qty 2

## 2021-11-05 MED ORDER — ROPIVACAINE HCL 5 MG/ML IJ SOLN
INTRAMUSCULAR | Status: DC | PRN
  Administered 2021-11-05: 30 mL via PERINEURAL

## 2021-11-05 MED ORDER — ASPIRIN EC 325 MG PO TBEC
325.0000 mg | DELAYED_RELEASE_TABLET | Freq: Every day | ORAL | Status: DC
  Administered 2021-11-06 – 2021-11-08 (×3): 325 mg via ORAL
  Filled 2021-11-05 (×3): qty 1

## 2021-11-05 MED ORDER — OXYCODONE HCL 5 MG PO TABS
5.0000 mg | ORAL_TABLET | ORAL | Status: DC | PRN
  Administered 2021-11-05 – 2021-11-06 (×4): 10 mg via ORAL
  Administered 2021-11-07: 5 mg via ORAL
  Filled 2021-11-05 (×5): qty 2

## 2021-11-05 MED ORDER — HYDROMORPHONE HCL 1 MG/ML IJ SOLN
INTRAMUSCULAR | Status: AC
  Filled 2021-11-05: qty 1

## 2021-11-05 MED ORDER — OXYCODONE HCL 5 MG/5ML PO SOLN: 5.0000 mg | Freq: Once | ORAL | Status: DC | PRN

## 2021-11-05 MED ORDER — SODIUM CHLORIDE 0.9 % IR SOLN
Status: DC | PRN
  Administered 2021-11-05: 1000 mL

## 2021-11-05 MED ORDER — PROPOFOL 10 MG/ML IV BOLUS
INTRAVENOUS | Status: DC | PRN
  Administered 2021-11-05: 50 mg via INTRAVENOUS

## 2021-11-05 MED ORDER — DIPHENHYDRAMINE HCL 12.5 MG/5ML PO ELIX: 12.5000 mg | ORAL_SOLUTION | ORAL | Status: DC | PRN

## 2021-11-05 MED ORDER — ONDANSETRON HCL 4 MG/2ML IJ SOLN
INTRAMUSCULAR | Status: DC | PRN
  Administered 2021-11-05: 4 mg via INTRAVENOUS

## 2021-11-05 MED ORDER — ACETAMINOPHEN 10 MG/ML IV SOLN
INTRAVENOUS | Status: AC
  Filled 2021-11-05: qty 100

## 2021-11-05 MED ORDER — HYDROMORPHONE HCL 1 MG/ML IJ SOLN
0.2500 mg | INTRAMUSCULAR | Status: DC | PRN
  Administered 2021-11-05 (×4): 0.5 mg via INTRAVENOUS

## 2021-11-05 MED ORDER — TRANEXAMIC ACID-NACL 1000-0.7 MG/100ML-% IV SOLN
1000.0000 mg | INTRAVENOUS | Status: AC
  Administered 2021-11-05: 1000 mg via INTRAVENOUS
  Filled 2021-11-05: qty 100

## 2021-11-05 MED ORDER — SPIRONOLACTONE 25 MG PO TABS
50.0000 mg | ORAL_TABLET | Freq: Every day | ORAL | Status: DC
  Administered 2021-11-05 – 2021-11-08 (×4): 50 mg via ORAL
  Filled 2021-11-05 (×4): qty 2

## 2021-11-05 MED ORDER — MIDAZOLAM HCL 2 MG/2ML IJ SOLN: 2.0000 mg | Freq: Once | INTRAMUSCULAR | Status: AC

## 2021-11-05 MED ORDER — FENTANYL CITRATE (PF) 100 MCG/2ML IJ SOLN
INTRAMUSCULAR | Status: AC
  Administered 2021-11-05: 100 ug via INTRAVENOUS
  Filled 2021-11-05: qty 2

## 2021-11-05 MED ORDER — POLYETHYLENE GLYCOL 3350 17 G PO PACK
17.0000 g | PACK | Freq: Every day | ORAL | Status: DC | PRN
  Administered 2021-11-07: 17 g via ORAL
  Filled 2021-11-05: qty 1

## 2021-11-05 MED ORDER — ONDANSETRON HCL 4 MG/2ML IJ SOLN: 4.0000 mg | Freq: Once | INTRAMUSCULAR | Status: DC | PRN

## 2021-11-05 MED ORDER — ZOLPIDEM TARTRATE 5 MG PO TABS
5.0000 mg | ORAL_TABLET | Freq: Every evening | ORAL | Status: DC | PRN
  Administered 2021-11-06: 5 mg via ORAL
  Filled 2021-11-05: qty 1

## 2021-11-05 MED ORDER — POVIDONE-IODINE 10 % EX SWAB
2.0000 "application " | Freq: Once | CUTANEOUS | Status: AC
  Administered 2021-11-05: 2 via TOPICAL

## 2021-11-05 MED ORDER — METOCLOPRAMIDE HCL 5 MG/ML IJ SOLN: 5.0000 mg | Freq: Three times a day (TID) | INTRAMUSCULAR | Status: DC | PRN

## 2021-11-05 MED ORDER — DEXTROSE 5 % IV SOLN
INTRAVENOUS | Status: DC | PRN
  Administered 2021-11-05: 3 g via INTRAVENOUS

## 2021-11-05 MED ORDER — MIDAZOLAM HCL 2 MG/2ML IJ SOLN
INTRAMUSCULAR | Status: AC
  Administered 2021-11-05: 2 mg via INTRAVENOUS
  Filled 2021-11-05: qty 2

## 2021-11-05 MED ORDER — LACTATED RINGERS IV SOLN: INTRAVENOUS | Status: DC

## 2021-11-05 MED ORDER — SODIUM CHLORIDE 0.9 % IV SOLN: INTRAVENOUS | Status: DC

## 2021-11-05 MED ORDER — INSULIN GLARGINE-YFGN 100 UNIT/ML ~~LOC~~ SOLN
15.0000 [IU] | Freq: Every day | SUBCUTANEOUS | Status: DC
  Administered 2021-11-05 – 2021-11-07 (×3): 15 [IU] via SUBCUTANEOUS
  Filled 2021-11-05 (×4): qty 0.15

## 2021-11-05 MED ORDER — METFORMIN HCL 500 MG PO TABS
1000.0000 mg | ORAL_TABLET | Freq: Two times a day (BID) | ORAL | Status: DC
  Administered 2021-11-05 – 2021-11-08 (×6): 1000 mg via ORAL
  Filled 2021-11-05 (×6): qty 2

## 2021-11-05 MED ORDER — AMLODIPINE BESYLATE 10 MG PO TABS
10.0000 mg | ORAL_TABLET | Freq: Every morning | ORAL | Status: DC
  Administered 2021-11-06 – 2021-11-08 (×3): 10 mg via ORAL
  Filled 2021-11-05 (×3): qty 1

## 2021-11-05 MED ORDER — EPHEDRINE SULFATE-NACL 50-0.9 MG/10ML-% IV SOSY
PREFILLED_SYRINGE | INTRAVENOUS | Status: DC | PRN
  Administered 2021-11-05 (×3): 10 mg via INTRAVENOUS

## 2021-11-05 MED ORDER — MENTHOL 3 MG MT LOZG: 1.0000 | LOZENGE | OROMUCOSAL | Status: DC | PRN

## 2021-11-05 MED ORDER — INSULIN ASPART 100 UNIT/ML IJ SOLN
0.0000 [IU] | Freq: Three times a day (TID) | INTRAMUSCULAR | Status: DC
  Administered 2021-11-06: 7 [IU] via SUBCUTANEOUS
  Administered 2021-11-06: 4 [IU] via SUBCUTANEOUS
  Administered 2021-11-06: 7 [IU] via SUBCUTANEOUS
  Administered 2021-11-07: 4 [IU] via SUBCUTANEOUS
  Administered 2021-11-07 – 2021-11-08 (×3): 7 [IU] via SUBCUTANEOUS

## 2021-11-05 MED ORDER — ALUM & MAG HYDROXIDE-SIMETH 200-200-20 MG/5ML PO SUSP: 30.0000 mL | ORAL | Status: DC | PRN

## 2021-11-05 MED ORDER — BUPIVACAINE IN DEXTROSE 0.75-8.25 % IT SOLN
INTRATHECAL | Status: DC | PRN
  Administered 2021-11-05: 13.5 mg via INTRATHECAL

## 2021-11-05 MED ORDER — HYDROCHLOROTHIAZIDE 25 MG PO TABS
25.0000 mg | ORAL_TABLET | Freq: Every day | ORAL | Status: DC
  Administered 2021-11-05 – 2021-11-08 (×4): 25 mg via ORAL
  Filled 2021-11-05 (×4): qty 1

## 2021-11-05 MED ORDER — ACETAMINOPHEN 325 MG PO TABS
325.0000 mg | ORAL_TABLET | Freq: Four times a day (QID) | ORAL | Status: DC | PRN
  Filled 2021-11-05: qty 2

## 2021-11-05 MED ORDER — CEFAZOLIN SODIUM-DEXTROSE 2-4 GM/100ML-% IV SOLN
2.0000 g | Freq: Four times a day (QID) | INTRAVENOUS | Status: AC
  Administered 2021-11-05 – 2021-11-06 (×2): 2 g via INTRAVENOUS
  Filled 2021-11-05 (×2): qty 100

## 2021-11-05 MED ORDER — TRANEXAMIC ACID 1000 MG/10ML IV SOLN
INTRAVENOUS | Status: DC | PRN
  Administered 2021-11-05: 2000 mg via TOPICAL

## 2021-11-05 MED ORDER — TRANEXAMIC ACID-NACL 1000-0.7 MG/100ML-% IV SOLN
1000.0000 mg | Freq: Once | INTRAVENOUS | Status: AC
  Administered 2021-11-05: 1000 mg via INTRAVENOUS
  Filled 2021-11-05: qty 100

## 2021-11-05 MED ORDER — PROPOFOL 500 MG/50ML IV EMUL
INTRAVENOUS | Status: DC | PRN
  Administered 2021-11-05: 100 ug/kg/min via INTRAVENOUS

## 2021-11-05 MED ORDER — FENTANYL CITRATE (PF) 100 MCG/2ML IJ SOLN: 100.0000 ug | Freq: Once | INTRAMUSCULAR | Status: AC

## 2021-11-05 MED ORDER — METHOCARBAMOL 1000 MG/10ML IJ SOLN
500.0000 mg | Freq: Four times a day (QID) | INTRAVENOUS | Status: DC | PRN
  Filled 2021-11-05: qty 5

## 2021-11-05 MED ORDER — CHLORHEXIDINE GLUCONATE 0.12 % MT SOLN
15.0000 mL | Freq: Once | OROMUCOSAL | Status: AC
  Administered 2021-11-05: 15 mL via OROMUCOSAL
  Filled 2021-11-05: qty 15

## 2021-11-05 MED ORDER — ORAL CARE MOUTH RINSE: 15.0000 mL | Freq: Once | OROMUCOSAL | Status: AC

## 2021-11-05 MED ORDER — 0.9 % SODIUM CHLORIDE (POUR BTL) OPTIME
TOPICAL | Status: DC | PRN
  Administered 2021-11-05: 1000 mL

## 2021-11-05 MED ORDER — CEFAZOLIN IN SODIUM CHLORIDE 3-0.9 GM/100ML-% IV SOLN
3.0000 g | INTRAVENOUS | Status: DC
  Filled 2021-11-05: qty 100

## 2021-11-05 MED ORDER — METOCLOPRAMIDE HCL 5 MG PO TABS: 5.0000 mg | ORAL_TABLET | Freq: Three times a day (TID) | ORAL | Status: DC | PRN

## 2021-11-05 MED ORDER — PHENYLEPHRINE HCL-NACL 20-0.9 MG/250ML-% IV SOLN
INTRAVENOUS | Status: DC | PRN
  Administered 2021-11-05: 30 ug/min via INTRAVENOUS

## 2021-11-05 MED ORDER — METHOCARBAMOL 500 MG PO TABS
500.0000 mg | ORAL_TABLET | Freq: Four times a day (QID) | ORAL | Status: DC | PRN
  Administered 2021-11-05 – 2021-11-06 (×3): 500 mg via ORAL
  Filled 2021-11-05 (×3): qty 1

## 2021-11-05 MED ORDER — AMISULPRIDE (ANTIEMETIC) 5 MG/2ML IV SOLN: 10.0000 mg | Freq: Once | INTRAVENOUS | Status: DC | PRN

## 2021-11-05 MED ORDER — DOCUSATE SODIUM 100 MG PO CAPS
100.0000 mg | ORAL_CAPSULE | Freq: Two times a day (BID) | ORAL | Status: DC
  Administered 2021-11-05 – 2021-11-08 (×6): 100 mg via ORAL
  Filled 2021-11-05 (×6): qty 1

## 2021-11-05 MED ORDER — MIDAZOLAM HCL 2 MG/2ML IJ SOLN
INTRAMUSCULAR | Status: AC
  Filled 2021-11-05: qty 2

## 2021-11-05 MED ORDER — INSULIN ASPART 100 UNIT/ML IJ SOLN
6.0000 [IU] | Freq: Three times a day (TID) | INTRAMUSCULAR | Status: DC
  Administered 2021-11-06 – 2021-11-08 (×3): 6 [IU] via SUBCUTANEOUS

## 2021-11-05 MED ORDER — HYDROMORPHONE HCL 1 MG/ML IJ SOLN
0.5000 mg | INTRAMUSCULAR | Status: DC | PRN
  Administered 2021-11-05 – 2021-11-07 (×5): 1 mg via INTRAVENOUS
  Filled 2021-11-05 (×5): qty 1

## 2021-11-05 MED ORDER — GABAPENTIN 300 MG PO CAPS
300.0000 mg | ORAL_CAPSULE | Freq: Three times a day (TID) | ORAL | Status: DC
  Administered 2021-11-05 – 2021-11-08 (×9): 300 mg via ORAL
  Filled 2021-11-05 (×9): qty 1

## 2021-11-05 MED ORDER — PHENOL 1.4 % MT LIQD: 1.0000 | OROMUCOSAL | Status: DC | PRN

## 2021-11-05 MED ORDER — ALBUTEROL SULFATE (2.5 MG/3ML) 0.083% IN NEBU: 3.0000 mL | INHALATION_SOLUTION | RESPIRATORY_TRACT | Status: DC | PRN

## 2021-11-05 MED ORDER — CLONIDINE HCL (ANALGESIA) 100 MCG/ML EP SOLN
EPIDURAL | Status: DC | PRN
  Administered 2021-11-05: 100 ug

## 2021-11-05 MED ORDER — ONDANSETRON HCL 4 MG PO TABS: 4.0000 mg | ORAL_TABLET | Freq: Four times a day (QID) | ORAL | Status: DC | PRN

## 2021-11-05 MED ORDER — ROSUVASTATIN CALCIUM 5 MG PO TABS
5.0000 mg | ORAL_TABLET | Freq: Every day | ORAL | Status: DC
  Administered 2021-11-05 – 2021-11-07 (×3): 5 mg via ORAL
  Filled 2021-11-05 (×3): qty 1

## 2021-11-05 MED ORDER — IRBESARTAN 150 MG PO TABS
150.0000 mg | ORAL_TABLET | Freq: Every day | ORAL | Status: DC
  Administered 2021-11-05 – 2021-11-08 (×4): 150 mg via ORAL
  Filled 2021-11-05 (×4): qty 1

## 2021-11-05 MED ORDER — ACETAMINOPHEN 10 MG/ML IV SOLN
1000.0000 mg | Freq: Once | INTRAVENOUS | Status: DC | PRN
  Administered 2021-11-05: 1000 mg via INTRAVENOUS

## 2021-11-05 MED ORDER — PANTOPRAZOLE SODIUM 40 MG PO TBEC
40.0000 mg | DELAYED_RELEASE_TABLET | Freq: Every day | ORAL | Status: DC
  Administered 2021-11-05 – 2021-11-08 (×4): 40 mg via ORAL
  Filled 2021-11-05 (×4): qty 1

## 2021-11-05 MED ORDER — MIDAZOLAM HCL 5 MG/5ML IJ SOLN
INTRAMUSCULAR | Status: DC | PRN
  Administered 2021-11-05: 2 mg via INTRAVENOUS

## 2021-11-05 SURGICAL SUPPLY — 56 items
BAG COUNTER SPONGE SURGICOUNT (BAG) ×2 IMPLANT
BAG SPNG CNTER NS LX DISP (BAG) ×1
BLADE SAGITTAL 25.0X1.19X90 (BLADE) ×2 IMPLANT
BLADE SAW SGTL 13X75X1.27 (BLADE) ×2 IMPLANT
BLADE SURG 21 STRL SS (BLADE) ×4 IMPLANT
BNDG COHESIVE 6X5 TAN NS LF (GAUZE/BANDAGES/DRESSINGS) ×1 IMPLANT
BNDG COHESIVE 6X5 TAN STRL LF (GAUZE/BANDAGES/DRESSINGS) ×4 IMPLANT
BNDG GAUZE ELAST 4 BULKY (GAUZE/BANDAGES/DRESSINGS) ×2 IMPLANT
BOWL SMART MIX CTS (DISPOSABLE) ×2 IMPLANT
BSPLAT TIB E 2 PG STRL KN RT (Stem) ×1 IMPLANT
COMP FEM PERS SZ 8 RT (Joint) ×2 IMPLANT
COMPONENT FEM PERS SZ 8 RT (Joint) IMPLANT
COOLER ICEMAN CLASSIC (MISCELLANEOUS) ×2 IMPLANT
COVER SURGICAL LIGHT HANDLE (MISCELLANEOUS) ×2 IMPLANT
CUFF TOURN SGL QUICK 34 (TOURNIQUET CUFF) ×2
CUFF TOURN SGL QUICK 42 (TOURNIQUET CUFF) IMPLANT
CUFF TRNQT CYL 34X4.125X (TOURNIQUET CUFF) ×1 IMPLANT
DRAPE EXTREMITY T 121X128X90 (DISPOSABLE) ×2 IMPLANT
DRAPE HALF SHEET 40X57 (DRAPES) ×4 IMPLANT
DRAPE U-SHAPE 47X51 STRL (DRAPES) ×2 IMPLANT
DRSG ADAPTIC 3X8 NADH LF (GAUZE/BANDAGES/DRESSINGS) ×2 IMPLANT
DRSG PAD ABDOMINAL 8X10 ST (GAUZE/BANDAGES/DRESSINGS) ×2 IMPLANT
DURAPREP 26ML APPLICATOR (WOUND CARE) ×2 IMPLANT
ELECT REM PT RETURN 9FT ADLT (ELECTROSURGICAL) ×2
ELECTRODE REM PT RTRN 9FT ADLT (ELECTROSURGICAL) ×1 IMPLANT
FACESHIELD WRAPAROUND (MASK) ×2 IMPLANT
FACESHIELD WRAPAROUND OR TEAM (MASK) ×1 IMPLANT
GAUZE SPONGE 4X4 12PLY STRL (GAUZE/BANDAGES/DRESSINGS) ×2 IMPLANT
GLOVE SURG ORTHO LTX SZ9 (GLOVE) ×2 IMPLANT
GLOVE SURG UNDER POLY LF SZ9 (GLOVE) ×2 IMPLANT
GOWN STRL REUS W/ TWL XL LVL3 (GOWN DISPOSABLE) ×2 IMPLANT
GOWN STRL REUS W/TWL XL LVL3 (GOWN DISPOSABLE) ×4
HANDPIECE INTERPULSE COAX TIP (DISPOSABLE) ×2
HDLS TROCR DRIL PIN KNEE 75 (PIN) ×8
IMPL PATELLA METAL SZ32X10 (Joint) ×1 IMPLANT
INSERT TIBIA KNEE RIGHT 10 (Joint) ×1 IMPLANT
KIT BASIN OR (CUSTOM PROCEDURE TRAY) ×2 IMPLANT
KIT TURNOVER KIT B (KITS) ×2 IMPLANT
MANIFOLD NEPTUNE II (INSTRUMENTS) ×2 IMPLANT
NS IRRIG 1000ML POUR BTL (IV SOLUTION) ×2 IMPLANT
PACK TOTAL JOINT (CUSTOM PROCEDURE TRAY) ×2 IMPLANT
PAD ARMBOARD 7.5X6 YLW CONV (MISCELLANEOUS) ×2 IMPLANT
PAD COLD SHLDR WRAP-ON (PAD) ×2 IMPLANT
PIN DRILL HDLS TROCAR 75 4PK (PIN) IMPLANT
SCREW FEMALE HEX FIX 25X2.5 (ORTHOPEDIC DISPOSABLE SUPPLIES) ×1 IMPLANT
SET HNDPC FAN SPRY TIP SCT (DISPOSABLE) ×1 IMPLANT
STAPLER VISISTAT 35W (STAPLE) ×2 IMPLANT
STEM TIBIAL TRAB SZE RT (Stem) ×1 IMPLANT
SUCTION FRAZIER HANDLE 10FR (MISCELLANEOUS)
SUCTION TUBE FRAZIER 10FR DISP (MISCELLANEOUS) IMPLANT
SUT VIC AB 0 CT1 27 (SUTURE) ×2
SUT VIC AB 0 CT1 27XBRD ANBCTR (SUTURE) ×1 IMPLANT
SUT VIC AB 1 CTX 36 (SUTURE)
SUT VIC AB 1 CTX36XBRD ANBCTR (SUTURE) IMPLANT
TOWEL GREEN STERILE (TOWEL DISPOSABLE) ×2 IMPLANT
TOWEL GREEN STERILE FF (TOWEL DISPOSABLE) ×2 IMPLANT

## 2021-11-05 NOTE — Op Note (Signed)
DATE OF SURGERY:  11/05/2021  TIME: 11:37 AM  PATIENT NAME:  Terri Logan    AGE: 55 y.o.    PRE-OPERATIVE DIAGNOSIS:  Osteoarthritis Right Knee  POST-OPERATIVE DIAGNOSIS:  Osteoarthritis Right Knee  PROCEDURE:  Procedure(s): RIGHT TOTAL KNEE ARTHROPLASTY  SURGEON: Aldean Baker  ASSISTANT: Hart Carwin  OPERATIVE IMPLANTS: [employer]immer Persona  Femur size 8, Tibia size E  2- Peg fixed bearing, Patella size 32 1-peg oval button, with a 10 mm polyethylene insert medial congruent.  @ENCIMAGES @      PREOPERATIVE INDICATIONS:   Terri Logan is a 55 y.o. year old female with end stage degenerative arthritis of the knee who failed conservative treatment and elected for Total Knee Arthroplasty.   The risks, benefits, and alternatives were discussed at length including but not limited to the risks of infection, bleeding, nerve injury, stiffness, blood clots, the need for revision surgery, cardiopulmonary complications, among others, and they were willing to proceed.  OPERATIVE DESCRIPTION:  The patient was brought to the operative room and placed in a supine position.  Anesthesia was administered.  IV antibiotics were given.  IV TXA was initiated.  The lower extremity was prepped and draped in the usual sterile fashion.  59 was used to cover all exposed skin. Time out was performed.    Anterior quadriceps tendon splitting approach was performed.  The patella was everted and osteophytes were removed.  The anterior horn of the medial and lateral meniscus was removed.   The distal femur was opened with the drill and the intramedullary distal femoral cutting jig was utilized, set at 5 degrees valgus resecting 9 mm off the distal femur.  Care was taken to protect the collateral ligaments.  Then the extramedullary tibial cutting jig was utilized set for 3 degree posterior slope.  Care was taken during the cut to protect the medial and collateral ligaments.  The proximal tibia was  removed along with the posterior horns of the menisci.  The PCL was sacrificed.    The extensor gap was measured and was approximately 10 mm.    The distal femoral sizing jig was applied, taking care to avoid notching.  Then the 4-in-1 cutting jig was applied and the anterior and posterior femur was cut, along with the chamfer cuts.  All posterior osteophytes were removed.  The flexion gap was then measured and was symmetric with the extension gap.  The distal femoral preparation using the appropriate jig to prepare the box.  The patella was then measured, and cut with the saw.    The proximal tibia sized and prepared accordingly with the reamer and the punch, and then all components were trialed with the poly insert.  The knee was found to have stable balance and full motion.  The knee was irrigated with normal saline, topical 2 g TXA was used to soak the wound.  The above named components were then press fit into place.  The final polyethylene component was placed.  The knee was then taken through a range of motion and the patella tracked well and the knee irrigated copiously and the parapatellar and subcutaneous tissue closed with vicryl, and skin closed with staples..  A sterile dressing was applied and patient  was taken to the PACU in stable  condition.  There were no complications.  Total tourniquet time was 0 minutes.

## 2021-11-05 NOTE — Anesthesia Postprocedure Evaluation (Signed)
Anesthesia Post Note  Patient: Terri Logan  Procedure(s) Performed: RIGHT TOTAL KNEE ARTHROPLASTY (Right: Knee)     Patient location during evaluation: Nursing Unit Anesthesia Type: Spinal Level of consciousness: oriented and awake and alert Pain management: pain level controlled Vital Signs Assessment: post-procedure vital signs reviewed and stable Respiratory status: spontaneous breathing and respiratory function stable Cardiovascular status: blood pressure returned to baseline and stable Postop Assessment: no headache, no backache, no apparent nausea or vomiting and patient able to bend at knees Anesthetic complications: no   No notable events documented.  Last Vitals:  Vitals:   11/05/21 1415 11/05/21 1430  BP: 132/72 (!) 146/76  Pulse: 70 70  Resp: 15 11  Temp:    SpO2: 96% 95%    Last Pain:  Vitals:   11/05/21 1430  TempSrc:   PainSc: Asleep                 Trevor Iha

## 2021-11-05 NOTE — Anesthesia Procedure Notes (Signed)
Spinal  Patient location during procedure: OR Start time: 11/05/2021 9:44 AM End time: 11/05/2021 9:53 AM Staffing Anesthesiologist: Trevor Iha, MD Preanesthetic Checklist Completed: patient identified, IV checked, site marked, risks and benefits discussed, surgical consent, monitors and equipment checked, pre-op evaluation and timeout performed Spinal Block Patient position: sitting Prep: DuraPrep Patient monitoring: cardiac monitor, continuous pulse ox, blood pressure and heart rate Approach: midline Location: L3-4 Injection technique: catheter Needle Needle type: Tuohy and Sprotte  Needle gauge: 24 G Needle length: 12.7 cm Needle insertion depth: 10 cm Catheter type: closed end flexible Catheter size: 19 g Assessment Sensory level: T6 Additional Notes 1 Attempts  L -2-3  and 1 Attempt at L3-4 successful

## 2021-11-05 NOTE — Discharge Instructions (Signed)

## 2021-11-05 NOTE — Transfer of Care (Signed)
Immediate Anesthesia Transfer of Care Note  Patient: MEKAILA TARNOW  Procedure(s) Performed: RIGHT TOTAL KNEE ARTHROPLASTY (Right: Knee)  Patient Location: PACU  Anesthesia Type:Spinal  Level of Consciousness: awake, alert  and patient cooperative  Airway & Oxygen Therapy: Patient Spontanous Breathing  Post-op Assessment: Report given to RN and Post -op Vital signs reviewed and stable  Post vital signs: Reviewed and stable  Last Vitals:  Vitals Value Taken Time  BP 117/63 11/05/21 1127  Temp    Pulse 72 11/05/21 1128  Resp 13 11/05/21 1128  SpO2 100 % 11/05/21 1128  Vitals shown include unvalidated device data.  Last Pain:  Vitals:   11/05/21 0809  TempSrc:   PainSc: 0-No pain         Complications: No notable events documented.

## 2021-11-05 NOTE — Anesthesia Procedure Notes (Signed)
Procedure Name: MAC Date/Time: 11/05/2021 10:03 AM Performed by: Annamary Carolin, CRNA Pre-anesthesia Checklist: Patient identified, Emergency Drugs available, Suction available, Patient being monitored and Timeout performed Patient Re-evaluated:Patient Re-evaluated prior to induction Oxygen Delivery Method: Simple face mask Preoxygenation: Pre-oxygenation with 100% oxygen Induction Type: IV induction Dental Injury: Teeth and Oropharynx as per pre-operative assessment

## 2021-11-05 NOTE — Anesthesia Procedure Notes (Signed)
Anesthesia Regional Block: Adductor canal block   Pre-Anesthetic Checklist: , timeout performed,  Correct Patient, Correct Site, Correct Laterality,  Correct Procedure, Correct Position, site marked,  Risks and benefits discussed,  Surgical consent,  Pre-op evaluation,  At surgeon's request and post-op pain management  Laterality: Lower and Right  Prep: chloraprep       Needles:  Injection technique: Single-shot  Needle Type: Echogenic Needle     Needle Length: 9cm  Needle Gauge: 22     Additional Needles:   Procedures:,,,, ultrasound used (permanent image in chart),,    Narrative:  Start time: 11/05/2021 9:10 AM End time: 11/05/2021 9:17 AM Injection made incrementally with aspirations every 5 mL.  Performed by: Personally  Anesthesiologist: Trevor Iha, MD  Additional Notes: Block assessed prior to surgery. Pt tolerated procedure well.

## 2021-11-05 NOTE — Progress Notes (Signed)
Inpatient Diabetes Program Recommendations  AACE/ADA: New Consensus Statement on Inpatient Glycemic Control (2015)  Target Ranges:  Prepandial:   less than 140 mg/dL      Peak postprandial:   less than 180 mg/dL (1-2 hours)      Critically ill patients:  140 - 180 mg/dL   Lab Results  Component Value Date   GLUCAP 171 (H) 11/05/2021   HGBA1C 7.6 (H) 11/02/2021    Review of Glycemic Control Results for NOLENE, ROCKS (MRN [id number]) as of 11/05/2021 16:41  Ref. Range 11/05/2021 07:56 11/05/2021 11:27  Glucose-Capillary Latest Ref Range: 70 - 99 mg/dL 409 (H) 811 (H)   Diabetes history: DM 2 Outpatient Diabetes medications:  Tresiba 50 units bid, Metformin 1000 mg bid, Novolog 16 units tid with meals Current orders for Inpatient glycemic control:  Semglee 15 units q HS, Novolog 6 units tid with meals, Novolog resistant tid with meals  Inpatient Diabetes Program Recommendations:    Agree with current orders.  Will follow.   Thanks,  Beryl Meager, RN, BC-ADM Inpatient Diabetes Coordinator Pager [phone]  (8a-5p)

## 2021-11-05 NOTE — H&P (Signed)
TOTAL KNEE ADMISSION H&P  Patient is being admitted for right total knee arthroplasty.  Subjective:  Chief Complaint:right knee pain.  HPI: Terri Logan, 55 y.o. female, has a history of pain and functional disability in the right knee due to arthritis and has failed non-surgical conservative treatments for greater than 12 weeks to includeNSAID's and/or analgesics, corticosteriod injections, use of assistive devices, weight reduction as appropriate, and activity modification.  Onset of symptoms was gradual, starting 8 years ago with gradually worsening course since that time. The patient noted no past surgery on the right knee(s).  Patient currently rates pain in the right knee(s) at 8 out of 10 with activity. Patient has night pain, worsening of pain with activity and weight bearing, pain that interferes with activities of daily living, pain with passive range of motion, crepitus, and joint swelling.  Patient has evidence of subchondral cysts, subchondral sclerosis, periarticular osteophytes, joint subluxation, and joint space narrowing by imaging studies. This patient has had avascular necrosis of the knee. There is no active infection.  Patient Active Problem List   Diagnosis Date Noted   Medial meniscus, posterior horn derangement, right    De Quervain's syndrome (tenosynovitis)    DIABETES MELLITUS, UNCONTROLLED 07/22/2009   ANKLE, ARTHRITIS, DEGEN./OSTEO 08/21/2008   ADJUSTMENT DISORDER WITH DEPRESSED MOOD 08/13/2008   ALLERGIC RHINITIS 05/15/2008   LEG PAIN, CHRONIC 10/05/2007   LEUKOCYTOSIS 04/05/2007   SORE THROAT 04/05/2007   SARCOIDOSIS 01/29/2007   OBESITY, MORBID 01/29/2007   OBSTRUCTIVE SLEEP APNEA 01/29/2007   MIGRAINE HEADACHE 01/29/2007   HYPERTENSION 01/29/2007   OSTEOARTHRITIS 01/29/2007   Past Medical History:  Diagnosis Date   Arthritis    Diabetes mellitus without complication (HCC)    GERD (gastroesophageal reflux disease)    Hypertension    Sarcoidosis     Sleep apnea     Past Surgical History:  Procedure Laterality Date   CHOLECYSTECTOMY     15 years ago [facility]   DORSAL COMPARTMENT RELEASE Left 07/10/2015   Procedure: RELEASE DORSAL COMPARTMENT (DEQUERVAIN);  Surgeon: Vickki Hearing, MD;  Location: [facility];  Service: Orthopedics;  Laterality: Left;   KNEE ARTHROSCOPY WITH MEDIAL MENISECTOMY Right 10/20/2016   Procedure: KNEE ARTHROSCOPY WITH MEDIAL MENISECTOMY;  Surgeon: Vickki Hearing, MD;  Location: [facility];  Service: Orthopedics;  Laterality: Right;   ORIF ANKLE FRACTURE Right     Current Facility-Administered Medications  Medication Dose Route Frequency Provider Last Rate Last Admin   ceFAZolin (ANCEF) IVPB 3g/100 mL premix  3 g Intravenous On Call to OR Nadara Mustard, MD       fentaNYL (SUBLIMAZE) 100 MCG/2ML injection            lactated ringers infusion   Intravenous Continuous Hodierne, Adam, MD 10 mL/hr at 11/05/21 0820 New Bag at 11/05/21 0820   midazolam (VERSED) 2 MG/2ML injection            tranexamic acid (CYKLOKAPRON) 2,000 mg in sodium chloride 0.9 % 50 mL Topical Application  2,000 mg Topical To OR Nadara Mustard, MD       tranexamic acid (CYKLOKAPRON) IVPB 1,000 mg  1,000 mg Intravenous To OR Adonis Huguenin, NP       No Known Allergies  Social History   Tobacco Use   Smoking status: Former    Packs/day: 0.50    Years: 5.00    Pack years: 2.50    Types: Cigarettes    Quit date: 07/05/1994    Years since  quitting: 27.3   Smokeless tobacco: Never  Substance Use Topics   Alcohol use: No    History reviewed. No pertinent family history.   Review of Systems  All other systems reviewed and are negative.  Objective:  Physical Exam  Vital signs in last 24 hours: Temp:  [97.7 F (36.5 C)] 97.7 F (36.5 C) (11/11 0753) Pulse Rate:  [72] 72 (11/11 0753) Resp:  [18] 18 (11/11 0753) BP: (147)/(79) 147/79 (11/11 0753) SpO2:  [98 %] 98 % (11/11 0753) Weight:  [155.6 kg] 155.6 kg (11/11  0753)  Labs:   Estimated body mass index is 53.72 kg/m as calculated from the following:   Height as of this encounter: 5\' 7"  (1.702 m).   Weight as of this encounter: 155.6 kg.   Imaging Review Plain radiographs demonstrate moderate degenerative joint disease of the right knee(s). The overall alignment ismild varus. The bone quality appears to be adequate for age and reported activity level.      Assessment/Plan:  End stage arthritis, right knee   The patient history, physical examination, clinical judgment of the provider and imaging studies are consistent with end stage degenerative joint disease of the right knee(s) and total knee arthroplasty is deemed medically necessary. The treatment options including medical management, injection therapy arthroscopy and arthroplasty were discussed at length. The risks and benefits of total knee arthroplasty were presented and reviewed. The risks due to aseptic loosening, infection, stiffness, patella tracking problems, thromboembolic complications and other imponderables were discussed. The patient acknowledged the explanation, agreed to proceed with the plan and consent was signed. Patient is being admitted for inpatient treatment for surgery, pain control, PT, OT, prophylactic antibiotics, VTE prophylaxis, progressive ambulation and ADL's and discharge planning. The patient is planning to be discharged home with home health services     Patient's anticipated LOS is less than 2 midnights, meeting these requirements: - Younger than 92 - Lives within 1 hour of care - Has a competent adult at home to recover with post-op recover - NO history of  - Chronic pain requiring opiods  - Diabetes  - Coronary Artery Disease  - Heart failure  - Heart attack  - Stroke  - DVT/VTE  - Cardiac arrhythmia  - Respiratory Failure/COPD  - Renal failure  - Anemia  - Advanced Liver disease

## 2021-11-06 ENCOUNTER — Encounter (HOSPITAL_COMMUNITY): Payer: Self-pay | Admitting: Orthopedic Surgery

## 2021-11-06 DIAGNOSIS — M1711 Unilateral primary osteoarthritis, right knee: Secondary | ICD-10-CM | POA: Diagnosis not present

## 2021-11-06 LAB — GLUCOSE, CAPILLARY
Glucose-Capillary: 224 mg/dL — ABNORMAL HIGH (ref 70–99)
Glucose-Capillary: 249 mg/dL — ABNORMAL HIGH (ref 70–99)
Glucose-Capillary: 180 mg/dL — ABNORMAL HIGH (ref 70–99)
Glucose-Capillary: 187 mg/dL — ABNORMAL HIGH (ref 70–99)

## 2021-11-06 MED ORDER — METHOCARBAMOL 500 MG PO TABS: 500.0000 mg | ORAL_TABLET | Freq: Three times a day (TID) | ORAL | 0 refills | Status: DC

## 2021-11-06 MED ORDER — OXYCODONE-ACETAMINOPHEN 5-325 MG PO TABS: 1.0000 | ORAL_TABLET | ORAL | 0 refills | Status: DC | PRN

## 2021-11-06 NOTE — Progress Notes (Signed)
Physical Therapy Treatment Patient Details Name: Terri Logan MRN: [id number] DOB: 06/23/1966 Today's Date: 11/06/2021   History of Present Illness Pt is a 55 y/o female s/p R TKA. PMH includes DM, HTN, sarcoidosis, and Dequervains.    PT Comments    Pt with slow progression towards goals. Session limited secondary to lethargy and wooziness. Required mod A to stand and min +2 to transfer to bed. Reviewed HEP in supine. Anticipate pt will progress well once symptoms improve. Will continue to follow acutely.     Recommendations for follow up therapy are one component of a multi-disciplinary discharge planning process, led by the attending physician.  Recommendations may be updated based on patient status, additional functional criteria and insurance authorization.  Follow Up Recommendations  Follow physician's recommendations for discharge plan and follow up therapies     Assistance Recommended at Discharge Frequent or constant Supervision/Assistance  Equipment Recommendations  None recommended by PT    Recommendations for Other Services       Precautions / Restrictions Precautions Precautions: Knee Precaution Booklet Issued: No Precaution Comments: Verbally reviewed knee precautions. Restrictions Weight Bearing Restrictions: Yes RLE Weight Bearing: Weight bearing as tolerated     Mobility  Bed Mobility Overal bed mobility: Needs Assistance Bed Mobility: Sit to Supine       Sit to supine: Min assist   General bed mobility comments: Required assist for RLE support    Transfers Overall transfer level: Needs assistance Equipment used: Rolling walker (2 wheels) Transfers: Sit to/from Stand;Bed to chair/wheelchair/BSC Sit to Stand: Mod assist;Min assist;+2 physical assistance Stand pivot transfers: Min assist;+2 physical assistance         General transfer comment: Required mod A to stand from chair and pt reporting increased wooziness. Had RN present during  second attempt to stand for safety. Required min A +2 to transfer back to bed. Minimal weight bearing on RLE.    Ambulation/Gait               General Gait Details: unable   Stairs             Wheelchair Mobility    Modified Rankin (Stroke Patients Only)       Balance Overall balance assessment: Needs assistance Sitting-balance support: No upper extremity supported Sitting balance-Leahy Scale: Fair     Standing balance support: Bilateral upper extremity supported Standing balance-Leahy Scale: Poor Standing balance comment: Reliant on BUE and external support                            Cognition Arousal/Alertness: Suspect due to medications Behavior During Therapy: WFL for tasks assessed/performed Overall Cognitive Status: Within Functional Limits for tasks assessed                                 General Comments: Pt very lethargic after taking pain medications and falling asleep easily in chair.        Exercises Total Joint Exercises Ankle Circles/Pumps: AROM;Both;10 reps Quad Sets: AROM;Right;5 reps Heel Slides: AAROM;Right;5 reps Goniometric ROM: 5-64    General Comments        Pertinent Vitals/Pain Pain Assessment: Faces Faces Pain Scale: Hurts whole lot Pain Location: R knee Pain Descriptors / Indicators: Guarding;Grimacing Pain Intervention(s): Limited activity within patient's tolerance;Monitored during session;Repositioned    Home Living  Prior Function            PT Goals (current goals can now be found in the care plan section) Acute Rehab PT Goals Patient Stated Goal: to go home PT Goal Formulation: With patient Time For Goal Achievement: 11/20/21 Potential to Achieve Goals: Good Progress towards PT goals: Progressing toward goals    Frequency    7X/week      PT Plan Current plan remains appropriate    Co-evaluation              AM-PAC PT "6  Clicks" Mobility   Outcome Measure  Help needed turning from your back to your side while in a flat bed without using bedrails?: A Little Help needed moving from lying on your back to sitting on the side of a flat bed without using bedrails?: A Lot Help needed moving to and from a bed to a chair (including a wheelchair)?: A Lot Help needed standing up from a chair using your arms (e.g., wheelchair or bedside chair)?: A Lot Help needed to walk in hospital room?: A Lot Help needed climbing 3-5 steps with a railing? : A Lot 6 Click Score: 13    End of Session   Activity Tolerance: Patient limited by pain;Patient limited by lethargy Patient left: with call bell/phone within reach;in bed Nurse Communication: Mobility status PT Visit Diagnosis: Unsteadiness on feet (R26.81);Muscle weakness (generalized) (M62.81);Difficulty in walking, not elsewhere classified (R26.2);Pain Pain - Right/Left: Right Pain - part of body: Knee     Time: 1143-1200 PT Time Calculation (min) (ACUTE ONLY): 17 min  Charges:  $Therapeutic Activity: 8-22 mins                     Cindee Salt, DPT  Acute Rehabilitation Services  Pager: [phone] Office: [phone]    Terri Logan 11/06/2021, 2:27 PM

## 2021-11-06 NOTE — Discharge Summary (Signed)
Discharge Diagnoses:  Active Problems:   Unilateral primary osteoarthritis, right knee   Total knee replacement status, right   Surgeries: Procedure(s): RIGHT TOTAL KNEE ARTHROPLASTY on 11/05/2021    Consultants:   Discharged Condition: Improved  Hospital Course: Terri Logan is an 55 y.o. female who was admitted 11/05/2021 with a chief complaint of osteoarthritis right knee, with a final diagnosis of Osteoarthritis Right Knee.  Patient was brought to the operating room on 11/05/2021 and underwent Procedure(s): RIGHT TOTAL KNEE ARTHROPLASTY.    Patient was given perioperative antibiotics:  Anti-infectives (From admission, onward)    Start     Dose/Rate Route Frequency Ordered Stop   11/05/21 1730  ceFAZolin (ANCEF) IVPB 2g/100 mL premix        2 g 200 mL/hr over 30 Minutes Intravenous Every 6 hours 11/05/21 1636 11/06/21 0044   11/05/21 0745  ceFAZolin (ANCEF) IVPB 3g/100 mL premix  Status:  Discontinued        3 g 200 mL/hr over 30 Minutes Intravenous On call to O.R. 11/05/21 0740 11/05/21 1627     .  Patient was given sequential compression devices, early ambulation, and aspirin for DVT prophylaxis.  Recent vital signs: Patient Vitals for the past 24 hrs:  BP Temp Temp src Pulse Resp SpO2  11/06/21 0548 137/69 99.1 F (37.3 C) -- 90 -- 98 %  11/06/21 0015 (!) 146/79 99.6 F (37.6 C) Oral 85 19 98 %  11/05/21 2237 -- -- -- 89 -- 92 %  11/05/21 2054 (!) 147/68 98.7 F (37.1 C) Oral (!) 55 18 99 %  11/05/21 1634 (!) 141/73 -- -- 70 -- 100 %  11/05/21 1632 -- 97.9 F (36.6 C) Oral -- -- --  11/05/21 1600 120/68 97.9 F (36.6 C) -- 71 15 97 %  11/05/21 1530 (!) 142/88 -- -- 68 13 96 %  11/05/21 1500 135/81 -- -- 75 14 97 %  11/05/21 1445 (!) 141/77 -- -- 70 11 97 %  11/05/21 1430 (!) 146/76 -- -- 70 11 95 %  11/05/21 1415 132/72 -- -- 70 15 96 %  11/05/21 1400 (!) 143/74 -- -- 66 13 100 %  11/05/21 1345 138/74 -- -- 69 15 99 %  11/05/21 1330 124/84 -- -- 72 19  99 %  11/05/21 1315 (!) 155/85 -- -- 71 12 98 %  11/05/21 1300 120/80 -- -- 71 14 100 %  11/05/21 1245 136/76 -- -- 70 13 98 %  11/05/21 1230 (!) 145/81 -- -- 70 13 98 %  11/05/21 1215 138/73 -- -- 70 10 98 %  11/05/21 1200 132/68 -- -- 70 10 98 %  11/05/21 1145 130/70 -- -- 70 10 99 %  11/05/21 1130 117/63 98.5 F (36.9 C) -- 72 10 100 %  .  Recent laboratory studies: No results found.  Discharge Medications:   Allergies as of 11/06/2021   No Known Allergies      Medication List     STOP taking these medications    HYDROcodone-acetaminophen 10-325 MG tablet Commonly known as: NORCO       TAKE these medications    albuterol 108 (90 Base) MCG/ACT inhaler Commonly known as: VENTOLIN HFA Inhale 1-2 puffs into the lungs every 4 (four) hours as needed for wheezing or shortness of breath.   amLODipine 10 MG tablet Commonly known as: NORVASC Take 10 mg by mouth every morning.   ASPERCREME MAX ROLL-ON EX Apply 1 application topically daily as needed (pain).  cholecalciferol 1000 units tablet Commonly known as: VITAMIN D Take 1,000 Units by mouth daily.   cyclobenzaprine 10 MG tablet Commonly known as: FLEXERIL Take 10 mg by mouth 3 (three) times daily.   gabapentin 300 MG capsule Commonly known as: NEURONTIN Take 300 mg by mouth 3 (three) times daily.   hydrochlorothiazide 25 MG tablet Commonly known as: HYDRODIURIL Take 25 mg by mouth daily.   irbesartan 150 MG tablet Commonly known as: AVAPRO Take 150 mg by mouth daily.   meloxicam 15 MG tablet Commonly known as: MOBIC Take 15 mg by mouth daily.   metFORMIN 1000 MG tablet Commonly known as: GLUCOPHAGE Take 1,000 mg by mouth 2 (two) times daily with a meal.   methocarbamol 500 MG tablet Commonly known as: Robaxin Take 1 tablet (500 mg total) by mouth 3 (three) times daily.   NovoLOG FlexPen 100 UNIT/ML FlexPen Generic drug: insulin aspart Inject 16 Units into the skin 3 (three) times daily.    omeprazole 40 MG capsule Commonly known as: PRILOSEC Take 40 mg by mouth 2 (two) times daily.   oxyCODONE-acetaminophen 5-325 MG tablet Commonly known as: PERCOCET/ROXICET Take 1 tablet by mouth every 4 (four) hours as needed.   Ozempic (2 MG/DOSE) 8 MG/3ML Sopn Generic drug: Semaglutide (2 MG/DOSE) Inject 2 mg into the skin once a week.   rosuvastatin 5 MG tablet Commonly known as: CRESTOR Take 5 mg by mouth at bedtime.   spironolactone 50 MG tablet Commonly known as: ALDACTONE Take 50 mg by mouth daily.   Evaristo Bury FlexTouch 200 UNIT/ML FlexTouch Pen Generic drug: insulin degludec Inject 50 Units into the skin 2 (two) times daily.   VITAMIN E PO Take 1 capsule by mouth daily.   zinc gluconate 50 MG tablet Take 50 mg by mouth daily.               Durable Medical Equipment  (From admission, onward)           Start     Ordered   11/06/21 0931  For home use only DME Vac  Once        11/06/21 0932              Discharge Care Instructions  (From admission, onward)           Start     Ordered   11/06/21 0000  Weight bearing as tolerated       Question Answer Comment  Laterality bilateral   Extremity Lower      11/06/21 0932            Diagnostic Studies: No results found.  Patient benefited maximally from their hospital stay and there were no complications.     Disposition: Discharge disposition: 01-Home or Self Care      Discharge Instructions     Call MD / Call 911   Complete by: As directed    If you experience chest pain or shortness of breath, CALL 911 and be transported to the hospital emergency room.  If you develope a fever above 101 F, pus (white drainage) or increased drainage or redness at the wound, or calf pain, call your surgeon's office.   Constipation Prevention   Complete by: As directed    Drink plenty of fluids.  Prune juice may be helpful.  You may use a stool softener, such as Colace (over the counter) 100  mg twice a day.  Use MiraLax (over the counter) for constipation as needed.  Diet - low sodium heart healthy   Complete by: As directed    Increase activity slowly as tolerated   Complete by: As directed    Post-operative opioid taper instructions:   Complete by: As directed    POST-OPERATIVE OPIOID TAPER INSTRUCTIONS: It is important to wean off of your opioid medication as soon as possible. If you do not need pain medication after your surgery it is ok to stop day one. Opioids include: Codeine, Hydrocodone(Norco, Vicodin), Oxycodone(Percocet, oxycontin) and hydromorphone amongst others.  Long term and even short term use of opiods can cause: Increased pain response Dependence Constipation Depression Respiratory depression And more.  Withdrawal symptoms can include Flu like symptoms Nausea, vomiting And more Techniques to manage these symptoms Hydrate well Eat regular healthy meals Stay active Use relaxation techniques(deep breathing, meditating, yoga) Do Not substitute Alcohol to help with tapering If you have been on opioids for less than two weeks and do not have pain than it is ok to stop all together.  Plan to wean off of opioids This plan should start within one week post op of your joint replacement. Maintain the same interval or time between taking each dose and first decrease the dose.  Cut the total daily intake of opioids by one tablet each day Next start to increase the time between doses. The last dose that should be eliminated is the evening dose.      Weight bearing as tolerated   Complete by: As directed    Laterality: bilateral   Extremity: Lower       Follow-up Information     Nadara Mustard, MD Follow up in 1 week(s).   Specialty: Orthopedic Surgery Contact information: [address] [place] [address] [phone]                  Signed: Nadara Mustard 11/06/2021, 9:32 AM

## 2021-11-06 NOTE — Plan of Care (Signed)
  Problem: Education: Goal: Knowledge of General Education information will improve Description: Including pain rating scale, medication(s)/side effects and non-pharmacologic comfort measures Outcome: Progressing   Problem: Pain Managment: Goal: General experience of comfort will improve Outcome: Progressing   

## 2021-11-06 NOTE — Evaluation (Signed)
Physical Therapy Evaluation Patient Details Name: Terri Logan MRN: [id number] DOB: 1966-09-15 Today's Date: 11/06/2021  History of Present Illness  Pt is a 55 y/o female s/p R TKA. PMH includes DM, HTN, sarcoidosis, and De quervains.  Clinical Impression  Pt admitted secondary to problem above with deficits below. Pt requiring min A to stand and transfer to chair this session. Pt with increased pain and with limited weightbearing on RLE therefore further mobility deferred. Educated about knee precautions. Feel pt would benefit from continued acute PT to maximize functional mobility independence and safety. Will continue to follow.        Recommendations for follow up therapy are one component of a multi-disciplinary discharge planning process, led by the attending physician.  Recommendations may be updated based on patient status, additional functional criteria and insurance authorization.  Follow Up Recommendations Follow physician's recommendations for discharge plan and follow up therapies    Assistance Recommended at Discharge Frequent or constant Supervision/Assistance  Functional Status Assessment Patient has had a recent decline in their functional status and demonstrates the ability to make significant improvements in function in a reasonable and predictable amount of time.  Equipment Recommendations  None recommended by PT    Recommendations for Other Services       Precautions / Restrictions Precautions Precautions: Knee Precaution Booklet Issued: No Precaution Comments: Verbally reviewed knee precautions. Restrictions Weight Bearing Restrictions: Yes RLE Weight Bearing: Weight bearing as tolerated      Mobility  Bed Mobility Overal bed mobility: Needs Assistance Bed Mobility: Supine to Sit     Supine to sit: Mod assist     General bed mobility comments: Required assist for RLE and trunk elevation    Transfers Overall transfer level: Needs  assistance Equipment used: Rolling walker (2 wheels) Transfers: Sit to/from Stand;Bed to chair/wheelchair/BSC Sit to Stand: Min assist   Step pivot transfers: Min assist       General transfer comment: Required min A for lift assist and steadying to stand and take steps to chair. Very limited weightshift to RLE secondary to pain.    Ambulation/Gait                  Stairs            Wheelchair Mobility    Modified Rankin (Stroke Patients Only)       Balance Overall balance assessment: Needs assistance Sitting-balance support: No upper extremity supported Sitting balance-Leahy Scale: Fair     Standing balance support: Bilateral upper extremity supported Standing balance-Leahy Scale: Poor Standing balance comment: Reliant on BUE and external support                             Pertinent Vitals/Pain Pain Assessment: Faces Faces Pain Scale: Hurts whole lot Pain Location: R knee Pain Descriptors / Indicators: Guarding;Grimacing Pain Intervention(s): Limited activity within patient's tolerance;Monitored during session;Repositioned    Home Living Family/patient expects to be discharged to:: Private residence Living Arrangements: Spouse/significant other Available Help at Discharge: Family;Available 24 hours/day Type of Home: House Home Access: Stairs to enter Entrance Stairs-Rails: Right;Can reach both;Left Entrance Stairs-Number of Steps: 3   Home Layout: One level Home Equipment: Agricultural consultant (2 wheels);Rollator (4 wheels);BSC/3in1;Shower seat      Prior Function Prior Level of Function : Independent/Modified Independent             Mobility Comments: Used cane for ambulation       Hand  Dominance        Extremity/Trunk Assessment   Upper Extremity Assessment Upper Extremity Assessment: Defer to OT evaluation    Lower Extremity Assessment Lower Extremity Assessment: RLE deficits/detail RLE Deficits / Details: Deficits  consistent with post op pain and weakness.    Cervical / Trunk Assessment Cervical / Trunk Assessment: Normal  Communication   Communication: No difficulties  Cognition Arousal/Alertness: Awake/alert Behavior During Therapy: WFL for tasks assessed/performed Overall Cognitive Status: Within Functional Limits for tasks assessed                                          General Comments      Exercises Total Joint Exercises Ankle Circles/Pumps: AROM;Both;20 reps   Assessment/Plan    PT Assessment Patient needs continued PT services  PT Problem List Decreased strength;Decreased range of motion;Decreased activity tolerance;Decreased balance;Decreased mobility;Decreased knowledge of use of DME;Decreased knowledge of precautions;Pain       PT Treatment Interventions DME instruction;Gait training;Stair training;Functional mobility training;Therapeutic activities;Therapeutic exercise;Balance training;Patient/family education    PT Goals (Current goals can be found in the Care Plan section)  Acute Rehab PT Goals Patient Stated Goal: to go home PT Goal Formulation: With patient Time For Goal Achievement: 11/20/21 Potential to Achieve Goals: Good    Frequency 7X/week   Barriers to discharge        Co-evaluation               AM-PAC PT "6 Clicks" Mobility  Outcome Measure Help needed turning from your back to your side while in a flat bed without using bedrails?: A Little Help needed moving from lying on your back to sitting on the side of a flat bed without using bedrails?: A Lot Help needed moving to and from a bed to a chair (including a wheelchair)?: A Little Help needed standing up from a chair using your arms (e.g., wheelchair or bedside chair)?: A Little Help needed to walk in hospital room?: A Lot Help needed climbing 3-5 steps with a railing? : A Lot 6 Click Score: 15    End of Session   Activity Tolerance: Patient limited by pain Patient  left: in chair;with call bell/phone within reach Nurse Communication: Mobility status PT Visit Diagnosis: Unsteadiness on feet (R26.81);Muscle weakness (generalized) (M62.81);Difficulty in walking, not elsewhere classified (R26.2);Pain Pain - Right/Left: Right Pain - part of body: Knee    Time: 0720-0738 PT Time Calculation (min) (ACUTE ONLY): 18 min   Charges:   PT Evaluation $PT Eval Low Complexity: 1 Low          Cindee Salt, DPT  Acute Rehabilitation Services  Pager: [phone] Office: [phone]   Lehman Prom 11/06/2021, 7:48 AM

## 2021-11-06 NOTE — Progress Notes (Signed)
Patient ID: Terri Logan, female   DOB: 05-19-1966, 55 y.o.   MRN: [id number] Patient is postoperative day 1 right total knee arthroplasty.  The incisional wound VAC is functioning well.  I will write orders for potential discharge today but anticipate patient will discharge on Sunday.

## 2021-11-07 DIAGNOSIS — M1711 Unilateral primary osteoarthritis, right knee: Secondary | ICD-10-CM | POA: Diagnosis not present

## 2021-11-07 LAB — GLUCOSE, CAPILLARY
Glucose-Capillary: 196 mg/dL — ABNORMAL HIGH (ref 70–99)
Glucose-Capillary: 267 mg/dL — ABNORMAL HIGH (ref 70–99)
Glucose-Capillary: 239 mg/dL — ABNORMAL HIGH (ref 70–99)
Glucose-Capillary: 197 mg/dL — ABNORMAL HIGH (ref 70–99)
Glucose-Capillary: 181 mg/dL — ABNORMAL HIGH (ref 70–99)

## 2021-11-07 MED ORDER — ZOLPIDEM TARTRATE 5 MG PO TABS: 5.0000 mg | ORAL_TABLET | Freq: Every evening | ORAL | 0 refills | Status: AC | PRN

## 2021-11-07 MED ORDER — ASPIRIN EC 325 MG PO TBEC: 325.0000 mg | DELAYED_RELEASE_TABLET | Freq: Every day | ORAL | 0 refills | Status: AC

## 2021-11-07 NOTE — Evaluation (Signed)
Occupational Therapy Evaluation Patient Details Name: Terri Logan MRN: [id number] DOB: Oct 18, 1966 Today's Date: 11/07/2021   History of Present Illness Pt is a 55 y/o female s/p R TKA. PMH includes DM, HTN, sarcoidosis, and Dequervains.   Clinical Impression   Patient is s/p R TKA surgery resulting in functional limitations due to the deficits listed below (see OT problem list). Pt currently limited with basic transfer and LB dressing. Pt motivated and reports 6 out 10 pain. Pt reliant on RW and pivot toward the L LE. Pt did not advance in ambulation transfer this session but rather a pivot transfer. Pt able to complete sponge bath UB with setup of items and requiring extensive (A) for LB. Pt's spouse will give 24/7 at home and has son near by to (A) as well. Pt is progressing toward goals however is not ready to d/c home at this time based on morning session. Pt vomiting during session with medication given prior to session. Concerns for pain management.  Patient will benefit from skilled OT acutely to increase independence and safety with ADLS to allow discharge HHOT pending progress.       Recommendations for follow up therapy are one component of a multi-disciplinary discharge planning process, led by the attending physician.  Recommendations may be updated based on patient status, additional functional criteria and insurance authorization.   Follow Up Recommendations  Home health OT    Assistance Recommended at Discharge Set up Supervision/Assistance  Functional Status Assessment  Patient has had a recent decline in their functional status and demonstrates the ability to make significant improvements in function in a reasonable and predictable amount of time.  Equipment Recommendations  Other (comment) (reports having bsc, x2 walkers, a rollator, elevated toilet seat at home and no needs. Question if w/c could be needed if not progressing with ambulation with PT)     Recommendations for Other Services       Precautions / Restrictions Precautions Precautions: Knee Precaution Booklet Issued: No Precaution Comments: Verbally reviewed knee precautions.- wound vac on R knee with landyard holder Restrictions Weight Bearing Restrictions: Yes RLE Weight Bearing: Weight bearing as tolerated      Mobility Bed Mobility               General bed mobility comments: up on arrival but self reports need for HOB increased and bed rail to exit. pt provided a gait belt as leg lifter with good return demo    Transfers Overall transfer level: Needs assistance Equipment used: Rolling walker (2 wheels) Transfers: Sit to/from Stand;Bed to chair/wheelchair/BSC Sit to Stand: +2 physical assistance Stand pivot transfers: +2 physical assistance;Mod assist   Step pivot transfers: Mod assist     General transfer comment: pt with 2 person (A) from [facility] but able to 1 person mod (A) from chair      Balance Overall balance assessment: Needs assistance Sitting-balance support: No upper extremity supported Sitting balance-Leahy Scale: Fair     Standing balance support: Bilateral upper extremity supported;Reliant on assistive device for balance;During functional activity Standing balance-Leahy Scale: Poor                             ADL either performed or assessed with clinical judgement   ADL Overall ADL's : Needs assistance/impaired Eating/Feeding: Independent Eating/Feeding Details (indicate cue type and reason): making her own hot tea Grooming: Wash/dry hands;Wash/dry face;Oral care;Modified independent;Sitting   Upper Body Bathing: Min guard;Sitting  Lower Body Bathing: Maximal assistance   Upper Body Dressing : Minimal assistance   Lower Body Dressing: Maximal assistance   Toilet Transfer: Moderate assistance;BSC/3in1;Rolling walker (2 wheels);+2 for physical assistance;+2 for safety/equipment     Toileting - Clothing  Manipulation Details (indicate cue type and reason): unable to void on commode but when vomiting incontinence in chair occured. total (A) for peri care       General ADL Comments: pt on bsc on arrival. spouse present entire session. pt educated on washing around incision and sponge bath only due to wound vac. educated on sitting on couch with R leg toward back of couch and bed positioning. pt able to verbalize no pillow behind the knee and demonstrates ankle pumps throughout session and atetmpting hip flexion with UE assistance. pt educated to complete glut sets too. pt and spouse educated on ice machine in room and keep barrier between skin and patient. pt plans to wear house dresses at home but educated on dressing R LE and wound vac don with LB. pt educated to don wound vac prior to any transfer attempt. on arrival pt with wound vac attached to bed and extended to patient     Vision Baseline Vision/History: 0 No visual deficits       Perception     Praxis      Pertinent Vitals/Pain Pain Assessment: Faces Faces Pain Scale: Hurts even more Pain Location: R knee Pain Descriptors / Indicators: Guarding;Grimacing;Sore;Operative site guarding Pain Intervention(s): Monitored during session;Repositioned;Premedicated before session;Ice applied     Hand Dominance Right   Extremity/Trunk Assessment Upper Extremity Assessment Upper Extremity Assessment: Overall WFL for tasks assessed   Lower Extremity Assessment Lower Extremity Assessment: Defer to PT evaluation RLE Deficits / Details: Deficits consistent with post op pain and weakness.   Cervical / Trunk Assessment Cervical / Trunk Assessment: Normal   Communication Communication Communication: No difficulties   Cognition Arousal/Alertness: Awake/alert Behavior During Therapy: WFL for tasks assessed/performed Overall Cognitive Status: Within Functional Limits for tasks assessed                                        General Comments  vomiting x1 during session and given full bath due to this. Pt with clean linen placed under patient and on patient.    Exercises Exercises: Total Joint Total Joint Exercises Ankle Circles/Pumps: AROM;Both;10 reps Quad Sets: AROM;Right;5 reps Heel Slides: AAROM;Right;5 reps   Shoulder Instructions      Home Living Family/patient expects to be discharged to:: Private residence Living Arrangements: Spouse/significant other Available Help at Discharge: Family;Available 24 hours/day Type of Home: House Home Access: Stairs to enter [employer] of Steps: 3 Entrance Stairs-Rails: Right;Can reach both;Left Home Layout: One level     Bathroom Shower/Tub: Producer, television/film/video: Standard     Home Equipment: Agricultural consultant (2 wheels);Rollator (4 wheels);BSC/3in1;Shower seat   Additional Comments: spouse is with patient 24/7      Prior Functioning/Environment Prior Level of Function : Independent/Modified Independent             Mobility Comments: Used cane for ambulation ADLs Comments: hsas 3 grandchildren they watch regualarly and visit        OT Problem List: Decreased strength;Decreased activity tolerance;Impaired balance (sitting and/or standing);Decreased range of motion;Decreased knowledge of use of DME or AE;Decreased knowledge of precautions;Obesity;Pain      OT Treatment/Interventions: Self-care/ADL training;Therapeutic  exercise;Energy conservation;DME and/or AE instruction;Manual therapy;Modalities;Therapeutic activities;Patient/family education;Balance training    OT Goals(Current goals can be found in the care plan section) Acute Rehab OT Goals Patient Stated Goal: to get home OT Goal Formulation: With patient/family Time For Goal Achievement: 11/07/21 Potential to Achieve Goals: Good  OT Frequency: Min 2X/week   Barriers to D/C:            Co-evaluation              AM-PAC OT "6 Clicks" Daily Activity      Outcome Measure Help from another person eating meals?: None Help from another person taking care of personal grooming?: None Help from another person toileting, which includes using toliet, bedpan, or urinal?: A Lot Help from another person bathing (including washing, rinsing, drying)?: A Lot Help from another person to put on and taking off regular upper body clothing?: A Little Help from another person to put on and taking off regular lower body clothing?: A Lot 6 Click Score: 17   End of Session Equipment Utilized During Treatment: Gait belt;Rolling walker (2 wheels) Nurse Communication: Mobility status;Precautions;Weight bearing status  Activity Tolerance: Patient tolerated treatment well Patient left: in chair;with call bell/phone within reach;with family/visitor present  OT Visit Diagnosis: Unsteadiness on feet (R26.81);Muscle weakness (generalized) (M62.81);Pain Pain - Right/Left: Right Pain - part of body: Knee                Time: 7903-8333 OT Time Calculation (min): 50 min Charges:  OT General Charges $OT Visit: 1 Visit OT Evaluation $OT Eval Moderate Complexity: 1 Mod OT Treatments $Self Care/Home Management : 38-52 mins $Therapeutic Activity: 23-37 mins     Brynn, OTR/L  Acute Rehabilitation Services Pager: [phone] Office: [phone] .   Mateo Flow 11/07/2021, 10:45 AM

## 2021-11-07 NOTE — Plan of Care (Signed)

## 2021-11-07 NOTE — Progress Notes (Signed)
Subjective: 2 Days Post-Op Procedure(s) (LRB): RIGHT TOTAL KNEE ARTHROPLASTY (Right) Patient reports pain as moderate.  Has not navigated hallway or stairs with pt  Objective: Vital signs in last 24 hours: Temp:  [98.3 F (36.8 C)-98.8 F (37.1 C)] 98.3 F (36.8 C) (11/13 0446) Pulse Rate:  [85-90] 89 (11/13 0446) Resp:  [17-18] 18 (11/13 0446) BP: (122-154)/(70-108) 154/79 (11/13 0446) SpO2:  [96 %-99 %] 99 % (11/13 0446)  Intake/Output from previous day: 11/12 0701 - 11/13 0700 In: 480 [P.O.:480] Out: -  Intake/Output this shift: No intake/output data recorded.  No results for input(s): HGB in the last 72 hours. No results for input(s): WBC, RBC, HCT, PLT in the last 72 hours. No results for input(s): NA, K, CL, CO2, BUN, CREATININE, GLUCOSE, CALCIUM in the last 72 hours. No results for input(s): LABPT, INR in the last 72 hours.  Neurologically intact Neurovascular intact Sensation intact distally Intact pulses distally Dorsiflexion/Plantar flexion intact Incision: dressing C/D/I No cellulitis present Compartment soft Wound vac in place and functioning without fluid in canister   Assessment/Plan: 2 Days Post-Op Procedure(s) (LRB): RIGHT TOTAL KNEE ARTHROPLASTY (Right) Advance diet Up with therapy D/C IV fluids Discharge home with home health possibly today, but realistically tomorrow as she is having a hard time mobilizing due to pain control/morbid obesity WBAT RLE    Anticipated LOS equal to or greater than 2 midnights due to - Age 14 and older with one or more of the following:  - Obesity  - Expected need for hospital services (PT, OT, Nursing) required for safe  discharge  - Anticipated need for postoperative skilled nursing care or inpatient rehab  - Active co-morbidities: Diabetes OR   - Unanticipated findings during/Post Surgery: Slow post-op progression: GI, pain control, mobility  - Patient is a high risk of re-admission due to: None   Cristie Hem 11/07/2021, 8:32 AM

## 2021-11-07 NOTE — Progress Notes (Signed)
Physical Therapy Treatment Patient Details Name: Terri Logan MRN: [id number] DOB: [birth year] Today's Date: 11/07/2021   History of Present Illness Pt is a 55 y/o female s/p R TKA. PMH includes DM, HTN, sarcoidosis, and Dequervains.    PT Comments    Continuing work on functional mobility and activity tolerance;  Pt stated her goal for the session was to walk in the hallway again, and she did so very well; Smoother steps with less knee flexion in stance; on track for dc home tomorrow; will plan to perform stair training next session    Recommendations for follow up therapy are one component of a multi-disciplinary discharge planning process, led by the attending physician.  Recommendations may be updated based on patient status, additional functional criteria and insurance authorization.  Follow Up Recommendations  Follow physician's recommendations for discharge plan and follow up therapies     Assistance Recommended at Discharge Frequent or constant Supervision/Assistance  Equipment Recommendations  None recommended by PT    Recommendations for Other Services       Precautions / Restrictions Precautions Precautions: Knee Precaution Booklet Issued: Yes (comment) Restrictions RLE Weight Bearing: Weight bearing as tolerated     Mobility  Bed Mobility Overal bed mobility: Needs Assistance Bed Mobility: Supine to Sit     Supine to sit: Min assist     General bed mobility comments: Min assist to support LE briefly coming off of bed; slow, inefficient scoots    Transfers Overall transfer level: Needs assistance Equipment used: Rolling walker (2 wheels) Transfers: Sit to/from Stand Sit to Stand: Min assist     Step pivot transfers: Min assist     General transfer comment: Min assist to hold and steady recliner as pt pulled up on it to stand from teh bed; better rise from [facility]    Ambulation/Gait Ambulation/Gait assistance: Min assist Gait Distance (Feet): 35  Feet Assistive device: Rolling walker (2 wheels) Gait Pattern/deviations: Antalgic;Decreased stance time - right;Trunk flexed       General Gait Details: Cues for sequence, and to stand tall on R LE in stance, and activate quad for stance stability; first half of walk with physical assist to monitor and stabilize R knee in stance, a few notable instances of R knee small buckles; but good support of body on RW   Stairs             Wheelchair Mobility    Modified Rankin (Stroke Patients Only)       Balance     Sitting balance-Leahy Scale: Fair       Standing balance-Leahy Scale: Poor                              Cognition Arousal/Alertness: Awake/alert Behavior During Therapy: WFL for tasks assessed/performed Overall Cognitive Status: Within Functional Limits for tasks assessed                                          Exercises      General Comments        Pertinent Vitals/Pain Pain Assessment: Faces Faces Pain Scale: Hurts even more Pain Location: R knee Pain Descriptors / Indicators: Guarding;Grimacing;Sore;Operative site guarding Pain Intervention(s): Limited activity within patient's tolerance    Home Living  Prior Function            PT Goals (current goals can now be found in the care plan section) Acute Rehab PT Goals Patient Stated Goal: to go home PT Goal Formulation: With patient Time For Goal Achievement: 11/20/21 Potential to Achieve Goals: Good Progress towards PT goals: Progressing toward goals    Frequency    7X/week      PT Plan Current plan remains appropriate    Co-evaluation              AM-PAC PT "6 Clicks" Mobility   Outcome Measure  Help needed turning from your back to your side while in a flat bed without using bedrails?: A Little Help needed moving from lying on your back to sitting on the side of a flat bed without using bedrails?: A  Little Help needed moving to and from a bed to a chair (including a wheelchair)?: A Lot Help needed standing up from a chair using your arms (e.g., wheelchair or bedside chair)?: A Little Help needed to walk in hospital room?: A Little Help needed climbing 3-5 steps with a railing? : A Lot 6 Click Score: 16    End of Session Equipment Utilized During Treatment: Gait belt Activity Tolerance: Patient tolerated treatment well Patient left: in chair;with call bell/phone within reach Nurse Communication: Mobility status PT Visit Diagnosis: Unsteadiness on feet (R26.81);Muscle weakness (generalized) (M62.81);Difficulty in walking, not elsewhere classified (R26.2);Pain Pain - Right/Left: Right Pain - part of body: Knee     Time: 1530-1617 (minus 12-15 minutes while pt was on BSC) PT Time Calculation (min) (ACUTE ONLY): 47 min  Charges:  $Gait Training: 8-22 mins $Therapeutic Activity: 8-22 mins                     Van Clines, PT  Acute Rehabilitation Services Pager [phone] Office [phone]    Levi Aland 11/07/2021, 5:27 PM

## 2021-11-07 NOTE — Progress Notes (Signed)
Physical Therapy Treatment Patient Details Name: Terri Logan MRN: [id number] DOB: [birth year] Today's Date: 11/07/2021   History of Present Illness Pt is a 55 y/o female s/p R TKA. PMH includes DM, HTN, sarcoidosis, and Dequervains.    PT Comments    Continuing work on functional mobility and activity tolerance;  Session focused on ambulation, and pt's husband pushed the recliner behind to help boost pt's confidence; Walked from chair near window in room out into the hallway, and pt seemed pleased with her effort; noting small amplitude R knee buckle in stance, but pt able to support self on RW well;   Showing good progress; Will request another night inpatient, though, to allow for incr gait training this afternoon, and stair training tomorrow, first session   Recommendations for follow up therapy are one component of a multi-disciplinary discharge planning process, led by the attending physician.  Recommendations may be updated based on patient status, additional functional criteria and insurance authorization.  Follow Up Recommendations  Follow physician's recommendations for discharge plan and follow up therapies     Assistance Recommended at Discharge Frequent or constant Supervision/Assistance  Equipment Recommendations  None recommended by PT    Recommendations for Other Services       Precautions / Restrictions Precautions Precautions: Knee Precaution Booklet Issued: Yes (comment) Precaution Comments: Verbally reviewed knee precautions.- wound vac on R knee with landyard holder Restrictions Weight Bearing Restrictions: Yes RLE Weight Bearing: Weight bearing as tolerated     Mobility  Bed Mobility               General bed mobility comments: up on arrival but self reports need for HOB increased and bed rail to exit. pt provided a gait belt as leg lifter with good return demo    Transfers Overall transfer level: Needs assistance Equipment used: Rolling  walker (2 wheels) Transfers: Sit to/from Stand Sit to Stand: Min guard (with and without physical contact) Stand pivot transfers: +2 physical assistance;Mod assist   Step pivot transfers: Mod assist     General transfer comment: Stood from recliner chair x2 with good use of rails to push up; slow and painful rise, but not needing physical assist to stand    Ambulation/Gait Ambulation/Gait assistance: Min assist;+2 safety/equipment (husband pushed chair behind) Gait Distance (Feet): 35 Feet Assistive device: Rolling walker (2 wheels) Gait Pattern/deviations: Antalgic;Decreased stance time - right;Trunk flexed       General Gait Details: Cues for sequence, and to stand tall on R LE in stance, and activate quad for stance stability; first half of walk with physical assist to monitor and stabilize R knee in stance, a few notable instances of R knee small buckles; but good support of body on RW   Stairs         General stair comments: After walking, took husband to mock up of steps to discuss and demonstrate options for getting up teh 3 steps to enter home   Wheelchair Mobility    Modified Rankin (Stroke Patients Only)       Balance Overall balance assessment: Needs assistance Sitting-balance support: No upper extremity supported Sitting balance-Leahy Scale: Fair     Standing balance support: Bilateral upper extremity supported;Reliant on assistive device for balance;During functional activity Standing balance-Leahy Scale: Poor                              Cognition Arousal/Alertness: Awake/alert Behavior During Therapy: WFL for  tasks assessed/performed Overall Cognitive Status: Within Functional Limits for tasks assessed                                          Exercises Total Joint Exercises Ankle Circles/Pumps: AROM;Both;10 reps Quad Sets: AROM;Right;5 reps Heel Slides: AAROM;Right;5 reps Goniometric ROM: approx 0-60 degrees     General Comments General comments (skin integrity, edema, etc.): vomiting x1 during session and given full bath due to this. Pt with clean linen placed under patient and on patient.      Pertinent Vitals/Pain Pain Assessment: Faces Faces Pain Scale: Hurts even more Pain Location: R knee Pain Descriptors / Indicators: Guarding;Grimacing;Sore;Operative site guarding Pain Intervention(s): Monitored during session    Home Living Family/patient expects to be discharged to:: Private residence Living Arrangements: Spouse/significant other Available Help at Discharge: Family;Available 24 hours/day Type of Home: House Home Access: Stairs to enter Entrance Stairs-Rails: Right;Can reach both;Left Entrance Stairs-Number of Steps: 3   Home Layout: One level Home Equipment: Agricultural consultant (2 wheels);Rollator (4 wheels);BSC/3in1;Shower seat Additional Comments: spouse is with patient 24/7    Prior Function            PT Goals (current goals can now be found in the care plan section) Acute Rehab PT Goals Patient Stated Goal: to go home PT Goal Formulation: With patient Time For Goal Achievement: 11/20/21 Potential to Achieve Goals: Good Progress towards PT goals: Progressing toward goals    Frequency    7X/week      PT Plan Current plan remains appropriate    Co-evaluation              AM-PAC PT "6 Clicks" Mobility   Outcome Measure  Help needed turning from your back to your side while in a flat bed without using bedrails?: A Little Help needed moving from lying on your back to sitting on the side of a flat bed without using bedrails?: A Little Help needed moving to and from a bed to a chair (including a wheelchair)?: A Lot Help needed standing up from a chair using your arms (e.g., wheelchair or bedside chair)?: A Little Help needed to walk in hospital room?: A Little Help needed climbing 3-5 steps with a railing? : A Lot 6 Click Score: 16    End of Session  Equipment Utilized During Treatment: Gait belt Activity Tolerance: Patient tolerated treatment well Patient left: in chair;with call bell/phone within reach Nurse Communication: Mobility status PT Visit Diagnosis: Unsteadiness on feet (R26.81);Muscle weakness (generalized) (M62.81);Difficulty in walking, not elsewhere classified (R26.2);Pain Pain - Right/Left: Right Pain - part of body: Knee     Time: 1101-1136 PT Time Calculation (min) (ACUTE ONLY): 35 min  Charges:  $Gait Training: 23-37 mins                     Van Clines, [place]  Acute Rehabilitation Services Pager [phone] Office [phone]    Levi Aland 11/07/2021, 11:55 AM

## 2021-11-08 DIAGNOSIS — M1711 Unilateral primary osteoarthritis, right knee: Secondary | ICD-10-CM | POA: Diagnosis not present

## 2021-11-08 LAB — GLUCOSE, CAPILLARY
Glucose-Capillary: 246 mg/dL — ABNORMAL HIGH (ref 70–99)
Glucose-Capillary: 233 mg/dL — ABNORMAL HIGH (ref 70–99)

## 2021-11-08 NOTE — TOC Transition Note (Signed)
Transition of Care [facility]) - CM/SW Discharge Note   Patient Details  Name: Terri Logan MRN: [id number] Date of Birth: 03/28/1966  [facility] CM/SW Contact:  Bess Kinds, RN Phone Number: [phone] 11/08/2021, 9:45 AM   Clinical Narrative:     Spoke with patient and spouse at the bedside to discuss plans for transition home. Patient has home health therapy arranged prior to admission. She could not recall name of agency, but is expecting a call today. Stated that she has walker and 3/1 at home. Patient stated that she is progressing well and ready to transition home. Spouse to provide transportation in private vehicle. No further TOC needs identified.   Final next level of care: Home w Home Health Services Barriers to Discharge: No Barriers Identified   Patient Goals and CMS Choice        Discharge Placement                       Discharge Plan and Services                                     Social Determinants of Health (SDOH) Interventions     Readmission Risk Interventions No flowsheet data found.

## 2021-11-08 NOTE — Care Management Obs Status (Signed)
MEDICARE OBSERVATION STATUS NOTIFICATION   Patient Details  Name: Terri Logan MRN: [id number] Date of Birth: [birth year]   Medicare Observation Status Notification Given:  Yes    Bess Kinds, RN 11/08/2021, 9:45 AM

## 2021-11-08 NOTE — Progress Notes (Signed)
Patient ID: Terri Logan, female   DOB: [birth year], 55 y.o.   MRN: [id number] Patient making good progress with therapy.  Plan for stair training today and discharge after therapy.

## 2021-11-08 NOTE — Progress Notes (Signed)
Pt educated on discharge, Iv removed and belongings all collected. Pt adequate for discharge home

## 2021-11-08 NOTE — Progress Notes (Signed)
   11/08/21 1015  Clinical Encounter Type  Visited With Patient and family together  Visit Type Initial;Spiritual support  Referral From Nurse  Consult/Referral To Chaplain   Chaplain Tery Sanfilippo responded to the consult request for an Advance Directive. The patient's husband were at her bedside. Chaplain provided A.D. education. The patient said she wants her husband, Iantha Fallen, to be her HCPOA. The chaplain advised the patient since she is legally married, her Cottie Banda, will be designated by law to speak on her behalf if she becomes incapacitated. Chaplain Manson Passey advised HCPOA document can be done if she chooses someone other than her husband by completing an A.D. The patient expressed excitement of being discharged today. Patient said they have been married for 37 years.She said it is their faith and spirituality that has sustain her during her illness. Chaplain offered prayer. This note was prepared by Deneen Harts, M.Div..  For questions please contact by phone [phone].

## 2021-11-08 NOTE — Discharge Summary (Signed)
Discharge Diagnoses:  Active Problems:   Unilateral primary osteoarthritis, right knee   Total knee replacement status, right   Surgeries: Procedure(s): RIGHT TOTAL KNEE ARTHROPLASTY on 11/05/2021    Consultants:   Discharged Condition: Improved  Hospital Course: Terri Logan is an 55 y.o. female who was admitted 11/05/2021 with a chief complaint of osteoarthritis right knee, with a final diagnosis of Osteoarthritis Right Knee.  Patient was brought to the operating room on 11/05/2021 and underwent Procedure(s): RIGHT TOTAL KNEE ARTHROPLASTY.    Patient was given perioperative antibiotics:  Anti-infectives (From admission, onward)    Start     Dose/Rate Route Frequency Ordered Stop   11/05/21 1730  ceFAZolin (ANCEF) IVPB 2g/100 mL premix        2 g 200 mL/hr over 30 Minutes Intravenous Every 6 hours 11/05/21 1636 11/06/21 0044   11/05/21 0745  ceFAZolin (ANCEF) IVPB 3g/100 mL premix  Status:  Discontinued        3 g 200 mL/hr over 30 Minutes Intravenous On call to O.R. 11/05/21 0740 11/05/21 1627     .  Patient was given sequential compression devices, early ambulation, and aspirin for DVT prophylaxis.  Recent vital signs: Patient Vitals for the past 24 hrs:  BP Temp Temp src Pulse Resp SpO2  11/08/21 0359 (!) 141/73 98.6 F (37 C) -- 88 18 99 %  11/07/21 2016 135/63 98.6 F (37 C) Oral 94 18 98 %  .  Recent laboratory studies: No results found.  Discharge Medications:   Allergies as of 11/08/2021   No Known Allergies      Medication List     STOP taking these medications    HYDROcodone-acetaminophen 10-325 MG tablet Commonly known as: NORCO       TAKE these medications    albuterol 108 (90 Base) MCG/ACT inhaler Commonly known as: VENTOLIN HFA Inhale 1-2 puffs into the lungs every 4 (four) hours as needed for wheezing or shortness of breath.   amLODipine 10 MG tablet Commonly known as: NORVASC Take 10 mg by mouth every morning.   ASPERCREME  MAX ROLL-ON EX Apply 1 application topically daily as needed (pain).   aspirin EC 325 MG tablet Take 1 tablet (325 mg total) by mouth daily.   cholecalciferol 1000 units tablet Commonly known as: VITAMIN D Take 1,000 Units by mouth daily.   cyclobenzaprine 10 MG tablet Commonly known as: FLEXERIL Take 10 mg by mouth 3 (three) times daily.   gabapentin 300 MG capsule Commonly known as: NEURONTIN Take 300 mg by mouth 3 (three) times daily.   hydrochlorothiazide 25 MG tablet Commonly known as: HYDRODIURIL Take 25 mg by mouth daily.   irbesartan 150 MG tablet Commonly known as: AVAPRO Take 150 mg by mouth daily.   meloxicam 15 MG tablet Commonly known as: MOBIC Take 15 mg by mouth daily.   metFORMIN 1000 MG tablet Commonly known as: GLUCOPHAGE Take 1,000 mg by mouth 2 (two) times daily with a meal.   methocarbamol 500 MG tablet Commonly known as: Robaxin Take 1 tablet (500 mg total) by mouth 3 (three) times daily.   NovoLOG FlexPen 100 UNIT/ML FlexPen Generic drug: insulin aspart Inject 16 Units into the skin 3 (three) times daily.   omeprazole 40 MG capsule Commonly known as: PRILOSEC Take 40 mg by mouth 2 (two) times daily.   oxyCODONE-acetaminophen 5-325 MG tablet Commonly known as: PERCOCET/ROXICET Take 1 tablet by mouth every 4 (four) hours as needed.   Ozempic (2 MG/DOSE) 8  MG/3ML Sopn Generic drug: Semaglutide (2 MG/DOSE) Inject 2 mg into the skin once a week.   rosuvastatin 5 MG tablet Commonly known as: CRESTOR Take 5 mg by mouth at bedtime.   spironolactone 50 MG tablet Commonly known as: ALDACTONE Take 50 mg by mouth daily.   Evaristo Bury FlexTouch 200 UNIT/ML FlexTouch Pen Generic drug: insulin degludec Inject 50 Units into the skin 2 (two) times daily.   VITAMIN E PO Take 1 capsule by mouth daily.   zinc gluconate 50 MG tablet Take 50 mg by mouth daily.   zolpidem 5 MG tablet Commonly known as: Ambien Take 1 tablet (5 mg total) by mouth at  bedtime as needed for sleep.               Durable Medical Equipment  (From admission, onward)           Start     Ordered   11/06/21 0931  For home use only DME Vac  Once        11/06/21 0932              Discharge Care Instructions  (From admission, onward)           Start     Ordered   11/06/21 0000  Weight bearing as tolerated       Question Answer Comment  Laterality bilateral   Extremity Lower      11/06/21 0932            Diagnostic Studies: No results found.  Patient benefited maximally from their hospital stay and there were no complications.     Disposition: Discharge disposition: 01-Home or Self Care      Discharge Instructions     Call MD / Call 911   Complete by: As directed    If you experience chest pain or shortness of breath, CALL 911 and be transported to the hospital emergency room.  If you develope a fever above 101 F, pus (white drainage) or increased drainage or redness at the wound, or calf pain, call your surgeon's office.   Call MD / Call 911   Complete by: As directed    If you experience chest pain or shortness of breath, CALL 911 and be transported to the hospital emergency room.  If you develope a fever above 101 F, pus (white drainage) or increased drainage or redness at the wound, or calf pain, call your surgeon's office.   Constipation Prevention   Complete by: As directed    Drink plenty of fluids.  Prune juice may be helpful.  You may use a stool softener, such as Colace (over the counter) 100 mg twice a day.  Use MiraLax (over the counter) for constipation as needed.   Constipation Prevention   Complete by: As directed    Drink plenty of fluids.  Prune juice may be helpful.  You may use a stool softener, such as Colace (over the counter) 100 mg twice a day.  Use MiraLax (over the counter) for constipation as needed.   Diet - low sodium heart healthy   Complete by: As directed    Diet - low sodium heart  healthy   Complete by: As directed    Increase activity slowly as tolerated   Complete by: As directed    Increase activity slowly as tolerated   Complete by: As directed    Post-operative opioid taper instructions:   Complete by: As directed    POST-OPERATIVE OPIOID TAPER INSTRUCTIONS:  It is important to wean off of your opioid medication as soon as possible. If you do not need pain medication after your surgery it is ok to stop day one. Opioids include: Codeine, Hydrocodone(Norco, Vicodin), Oxycodone(Percocet, oxycontin) and hydromorphone amongst others.  Long term and even short term use of opiods can cause: Increased pain response Dependence Constipation Depression Respiratory depression And more.  Withdrawal symptoms can include Flu like symptoms Nausea, vomiting And more Techniques to manage these symptoms Hydrate well Eat regular healthy meals Stay active Use relaxation techniques(deep breathing, meditating, yoga) Do Not substitute Alcohol to help with tapering If you have been on opioids for less than two weeks and do not have pain than it is ok to stop all together.  Plan to wean off of opioids This plan should start within one week post op of your joint replacement. Maintain the same interval or time between taking each dose and first decrease the dose.  Cut the total daily intake of opioids by one tablet each day Next start to increase the time between doses. The last dose that should be eliminated is the evening dose.      Post-operative opioid taper instructions:   Complete by: As directed    POST-OPERATIVE OPIOID TAPER INSTRUCTIONS: It is important to wean off of your opioid medication as soon as possible. If you do not need pain medication after your surgery it is ok to stop day one. Opioids include: Codeine, Hydrocodone(Norco, Vicodin), Oxycodone(Percocet, oxycontin) and hydromorphone amongst others.  Long term and even short term use of opiods can  cause: Increased pain response Dependence Constipation Depression Respiratory depression And more.  Withdrawal symptoms can include Flu like symptoms Nausea, vomiting And more Techniques to manage these symptoms Hydrate well Eat regular healthy meals Stay active Use relaxation techniques(deep breathing, meditating, yoga) Do Not substitute Alcohol to help with tapering If you have been on opioids for less than two weeks and do not have pain than it is ok to stop all together.  Plan to wean off of opioids This plan should start within one week post op of your joint replacement. Maintain the same interval or time between taking each dose and first decrease the dose.  Cut the total daily intake of opioids by one tablet each day Next start to increase the time between doses. The last dose that should be eliminated is the evening dose.      Weight bearing as tolerated   Complete by: As directed    Laterality: bilateral   Extremity: Lower       Follow-up Information     Nadara Mustard, MD Follow up in 1 week(s).   Specialty: Orthopedic Surgery Contact information: [address] [place] [address] [phone]                  Signed: Nadara Mustard 11/08/2021, 7:29 AM

## 2021-11-08 NOTE — Progress Notes (Signed)
Occupational Therapy Treatment Patient Details Logan: Terri Logan MRN: [id number] DOB: [birth year] Today's Date: 11/08/2021   History of present illness Pt is a 55 y/o female s/p R TKA. PMH includes DM, HTN, sarcoidosis, and Dequervains.   OT comments  Pt at adequate level with reacher and family (A) to d/c home from [place] standpoint. Pt with good return demo of the lB education. Pt and spouse again educated on the ice machine needs. Noted handle of ice machine is split in half. All education is complete and patient indicates understanding.    Recommendations for follow up therapy are one component of a multi-disciplinary discharge planning process, led by the attending physician.  Recommendations may be updated based on patient status, additional functional criteria and insurance authorization.    Follow Up Recommendations  Home health OT    Assistance Recommended at Discharge Set up Supervision/Assistance  Equipment Recommendations  Other (comment)    Recommendations for Other Services      Precautions / Restrictions Precautions Precautions: Knee Precaution Comments: ice machine wound vac Restrictions Weight Bearing Restrictions: Yes RLE Weight Bearing: Weight bearing as tolerated       Mobility Bed Mobility               General bed mobility comments: in chair on arrival    Transfers                         Balance                                           ADL either performed or assessed with clinical judgement   ADL Overall ADL's : Needs assistance/impaired                 Upper Body Dressing : Set up   Lower Body Dressing: Minimal assistance Lower Body Dressing Details (indicate cue type and reason): (A) to hook over foot and has a reacher at home. pt able to thread wound vac without (A) into clothing and able to static stand and pull up LB clothing Toilet Transfer: Min guard;Rolling walker (2 wheels) Toilet  Transfer Details (indicate cue type and reason): increased time                Extremity/Trunk Assessment              Vision       Perception     Praxis      Cognition Arousal/Alertness: Awake/alert Behavior During Therapy: WFL for tasks assessed/performed Overall Cognitive Status: Within Functional Limits for tasks assessed                                 General Comments: good recall of education from previous session          Exercises     Shoulder Instructions       General Comments spouse present for focus on dressing for home    Pertinent Vitals/ Pain       Pain Assessment: Faces Faces Pain Scale: Hurts little more Pain Location: R knee Pain Descriptors / Indicators: Operative site guarding Pain Intervention(s): Premedicated before session  Home Living  Prior Functioning/Environment              Frequency  Min 2X/week        Progress Toward Goals  OT Goals(current goals can now be found in the care plan section)  Progress towards OT goals: Progressing toward goals  Acute Rehab OT Goals Patient Stated Goal: to go home today OT Goal Formulation: With patient/family Time For Goal Achievement: 11/07/21 Potential to Achieve Goals: Good ADL Goals Pt Will Perform Lower Body Dressing: with modified independence;sit to/from stand Pt Will Transfer to Toilet: with modified independence;ambulating;bedside commode Additional ADL Goal #1: pt will complete bed mobility with hob <20 no bedrails independently with spouse (A) Additional ADL Goal #2: pt will complete basic transfer with spouse mod I with RW  Plan Discharge plan remains appropriate    Co-evaluation                 AM-PAC OT "6 Clicks" Daily Activity     Outcome Measure   Help from another person eating meals?: None Help from another person taking care of personal grooming?: None Help from another  person toileting, which includes using toliet, bedpan, or urinal?: A Little Help from another person bathing (including washing, rinsing, drying)?: A Little Help from another person to put on and taking off regular upper body clothing?: A Little Help from another person to put on and taking off regular lower body clothing?: A Little 6 Click Score: 20    End of Session Equipment Utilized During Treatment: Rolling walker (2 wheels)  OT Visit Diagnosis: Unsteadiness on feet (R26.81);Muscle weakness (generalized) (M62.81);Pain Pain - Right/Left: Right Pain - part of body: Knee   Activity Tolerance Patient tolerated treatment well   Patient Left in chair;with call bell/phone within reach;with family/visitor present   Nurse Communication Mobility status;Precautions        Time: 2841-3244 OT Time Calculation (min): 10 min  Charges: OT General Charges $OT Visit: 1 Visit OT Treatments $Self Care/Home Management : 8-22 mins   Brynn, OTR/L  Acute Rehabilitation Services Pager: [phone] Office: [phone] .   Mateo Flow 11/08/2021, 3:28 PM

## 2021-11-08 NOTE — Progress Notes (Signed)
Pt d/c to home as ordered,Pt remains alert/oriented in no apparent distress.. No complaitns. Spouse is responsible for her ride.

## 2021-11-08 NOTE — Progress Notes (Signed)
Physical Therapy Treatment Patient Details Name: Terri Logan MRN: [id number] DOB: 10/28/1966 Today's Date: 11/08/2021   History of Present Illness Pt is a 55 y/o female s/p R TKA. PMH includes DM, HTN, sarcoidosis, and Dequervains.    PT Comments    Patient progressing slowly towards PT goals. Continues to require Min A for bed mobility and Mod A to stand from low surfaces. Session focused on progressive ambulation and stair training. Spouse present for stair training. 1 instance of right knee buckling post stairs but pt able to catch self. Verbally reviewed HEP. Pt eager to return him at noon today. Has supportive spouse at home to assist as needed. Will follow if still in the hospital.     Recommendations for follow up therapy are one component of a multi-disciplinary discharge planning process, led by the attending physician.  Recommendations may be updated based on patient status, additional functional criteria and insurance authorization.  Follow Up Recommendations  Follow physician's recommendations for discharge plan and follow up therapies     Assistance Recommended at Discharge Frequent or constant Supervision/Assistance  Equipment Recommendations  None recommended by PT    Recommendations for Other Services       Precautions / Restrictions Precautions Precautions: Knee Precaution Booklet Issued: Yes (comment) Precaution Comments: Verbally reviewed knee precautions.- wound vac on R knee with landyard holder Restrictions Weight Bearing Restrictions: Yes RLE Weight Bearing: Weight bearing as tolerated     Mobility  Bed Mobility Overal bed mobility: Needs Assistance Bed Mobility: Supine to Sit     Supine to sit: Min assist;HOB elevated     General bed mobility comments: Using gait belt as strap around RLE to bring to EOB. ASsist with trunk to get sitting upright, increased time/effort.    Transfers Overall transfer level: Needs assistance Equipment used:  Rolling walker (2 wheels) Transfers: Sit to/from Stand Sit to Stand: Mod assist;Min assist     Step pivot transfers: Min guard     General transfer comment: Stood from EOB x1 with multiple attempts and momentum needed with Mod A initially progressing to Min A from [facility] and chair wih something to push off from. Step pivot to get to BSc. Stood from Orthoptist. Cues for technique,.    Ambulation/Gait Ambulation/Gait assistance: Min guard Gait Distance (Feet): 50 Feet Assistive device: Rolling walker (2 wheels) Gait Pattern/deviations: Antalgic;Decreased stance time - right;Trunk flexed Gait velocity: decreased     General Gait Details: Slow, mildly unsteady gait with cues for knee extension during stance phase and knee flexion during swing. Pt talking self through sequencing. Slow. Right knee instability with 1 instance of buckling post stairs.   Stairs Stairs: Yes Stairs assistance: Min assist Stair Management: Step to pattern;Sideways;One rail Right Number of Stairs: 2 General stair comments: Cues for technique/safety, use of rail with BUEs/HHA onl eft, spouse present for family training. no knee buckling.   Wheelchair Mobility    Modified Rankin (Stroke Patients Only)       Balance Overall balance assessment: Needs assistance Sitting-balance support: Feet supported;No upper extremity supported Sitting balance-Leahy Scale: Fair     Standing balance support: During functional activity Standing balance-Leahy Scale: Poor Standing balance comment: Reliant on BUE                            Cognition Arousal/Alertness: Awake/alert Behavior During Therapy: WFL for tasks assessed/performed Overall Cognitive Status: Within Functional Limits for tasks assessed  Exercises      General Comments General comments (skin integrity, edema, etc.): SPouse present during session.      Pertinent Vitals/Pain Pain  Assessment: Faces Faces Pain Scale: Hurts even more Pain Location: R knee Pain Descriptors / Indicators: Guarding;Grimacing;Sore;Operative site guarding Pain Intervention(s): Premedicated before session;Monitored during session;Limited activity within patient's tolerance;Repositioned    Home Living                          Prior Function            PT Goals (current goals can now be found in the care plan section) Progress towards PT goals: Progressing toward goals    Frequency    7X/week      PT Plan Current plan remains appropriate    Co-evaluation              AM-PAC PT "6 Clicks" Mobility   Outcome Measure  Help needed turning from your back to your side while in a flat bed without using bedrails?: A Little Help needed moving from lying on your back to sitting on the side of a flat bed without using bedrails?: A Little Help needed moving to and from a bed to a chair (including a wheelchair)?: A Lot Help needed standing up from a chair using your arms (e.g., wheelchair or bedside chair)?: A Lot Help needed to walk in hospital room?: A Little Help needed climbing 3-5 steps with a railing? : A Little 6 Click Score: 16    End of Session Equipment Utilized During Treatment: Gait belt Activity Tolerance: Patient tolerated treatment well Patient left: in chair;with call bell/phone within reach;with family/visitor present Nurse Communication: Mobility status PT Visit Diagnosis: Unsteadiness on feet (R26.81);Muscle weakness (generalized) (M62.81);Difficulty in walking, not elsewhere classified (R26.2);Pain Pain - Right/Left: Right Pain - part of body: Knee     Time: 6283-1517 PT Time Calculation (min) (ACUTE ONLY): 40 min  Charges:  $Gait Training: 23-37 mins $Therapeutic Activity: 8-22 mins                     Vale Haven, PT, DPT Acute Rehabilitation Services Pager [phone] Office [phone]      Blake Divine A Lanier Ensign 11/08/2021, 10:34  AM

## 2021-11-12 ENCOUNTER — Encounter: Payer: Self-pay | Admitting: Family

## 2021-11-12 ENCOUNTER — Ambulatory Visit (INDEPENDENT_AMBULATORY_CARE_PROVIDER_SITE_OTHER): Payer: Medicare Other | Admitting: Family

## 2021-11-12 ENCOUNTER — Other Ambulatory Visit: Payer: Self-pay

## 2021-11-12 DIAGNOSIS — M1711 Unilateral primary osteoarthritis, right knee: Secondary | ICD-10-CM

## 2021-11-12 NOTE — Progress Notes (Signed)
   Post-Op Visit Note   Patient: Terri Logan           Date of Birth: [birth year]           MRN: [id number] Visit Date: 11/12/2021 PCP: Benita Stabile, MD  Chief Complaint:  Chief Complaint  Patient presents with   Right Knee - Routine Post Op    11/05/21 right total knee replacement     HPI:  HPI Is a 55 year old woman seen 1 week status post right total knee arthroplasty.  Her wound VAC was removed today.  Her incision is well approximated she is working with home health physical therapy Ortho Exam On examination of the right lower extremity there is mild edema her incision is well approximated with staples there is no gaping no drainage she has full extension and about 75 degrees of flexion  Visit Diagnoses: No diagnosis found.  Plan: May shower.  We will follow-up in 1 week for staple removal with radiographs of the knee anticipate ordering physical therapy at next visit  Follow-Up Instructions: Return in about 1 week (around 11/19/2021).   Imaging: No results found.  Orders:  No orders of the defined types were placed in this encounter.  No orders of the defined types were placed in this encounter.    PMFS History: 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   Unilateral primary osteoarthritis, right knee    Medial meniscus, posterior horn derangement, right    De Quervain's syndrome (tenosynovitis)    DIABETES MELLITUS, UNCONTROLLED 07/22/2009   ANKLE, ARTHRITIS, DEGEN./OSTEO 08/21/2008   ADJUSTMENT DISORDER WITH DEPRESSED MOOD 08/13/2008   ALLERGIC RHINITIS 05/15/2008   LEG PAIN, CHRONIC 10/05/2007   LEUKOCYTOSIS 04/05/2007   SORE THROAT 04/05/2007   SARCOIDOSIS 01/29/2007   OBESITY, MORBID 01/29/2007   OBSTRUCTIVE SLEEP APNEA 01/29/2007   MIGRAINE HEADACHE 01/29/2007   HYPERTENSION 01/29/2007   OSTEOARTHRITIS 01/29/2007   Past Medical History:  Diagnosis Date   Arthritis    Diabetes  mellitus without complication (HCC)    GERD (gastroesophageal reflux disease)    Hypertension    Sarcoidosis    Sleep apnea     History reviewed. No pertinent family history.  Past Surgical History:  Procedure Laterality Date   CHOLECYSTECTOMY     15 years ago [facility]   DORSAL COMPARTMENT RELEASE Left 07/10/2015   Procedure: RELEASE DORSAL COMPARTMENT (DEQUERVAIN);  Surgeon: Vickki Hearing, MD;  Location: [facility];  Service: Orthopedics;  Laterality: Left;   KNEE ARTHROSCOPY WITH MEDIAL MENISECTOMY Right 10/20/2016   Procedure: KNEE ARTHROSCOPY WITH MEDIAL MENISECTOMY;  Surgeon: Vickki Hearing, MD;  Location: [facility];  Service: Orthopedics;  Laterality: Right;   ORIF ANKLE FRACTURE Right    TOTAL KNEE ARTHROPLASTY Right 11/05/2021   Procedure: RIGHT TOTAL KNEE ARTHROPLASTY;  Surgeon: Nadara Mustard, MD;  Location: [facility] OR;  Service: Orthopedics;  Laterality: Right;   Social History   Occupational History   Not on file  Tobacco Use   Smoking status: Former    Packs/day: 0.50    Years: 5.00    Pack years: 2.50    Types: Cigarettes    Quit date: 07/05/1994    Years since quitting: 27.3   Smokeless tobacco: Never  Substance and Sexual Activity   Alcohol use: No   Drug use: No   Sexual activity: Yes    Birth control/protection: Surgical

## 2021-11-15 ENCOUNTER — Other Ambulatory Visit: Payer: Self-pay | Admitting: Orthopedic Surgery

## 2021-11-16 MED ORDER — OXYCODONE-ACETAMINOPHEN 5-325 MG PO TABS: 1.0000 | ORAL_TABLET | ORAL | 0 refills | Status: DC | PRN

## 2021-11-17 ENCOUNTER — Other Ambulatory Visit (INDEPENDENT_AMBULATORY_CARE_PROVIDER_SITE_OTHER): Payer: Self-pay | Admitting: Physician Assistant

## 2021-11-23 ENCOUNTER — Ambulatory Visit (INDEPENDENT_AMBULATORY_CARE_PROVIDER_SITE_OTHER): Payer: Medicare Other | Admitting: Family

## 2021-11-23 ENCOUNTER — Encounter: Payer: Self-pay | Admitting: Family

## 2021-11-23 ENCOUNTER — Ambulatory Visit (INDEPENDENT_AMBULATORY_CARE_PROVIDER_SITE_OTHER): Payer: Medicare Other

## 2021-11-23 DIAGNOSIS — Z96651 Presence of right artificial knee joint: Secondary | ICD-10-CM

## 2021-11-23 NOTE — Progress Notes (Signed)
   Post-Op Visit Note   Patient: Terri Logan           Date of Birth: Mar 25, 1966           MRN: [id number] Visit Date: 11/23/2021 PCP: Benita Stabile, MD  Chief Complaint:  Chief Complaint  Patient presents with   Right Knee - Routine Post Op    Right TKA 11/05/21    HPI:  HPI Is a 55 year old woman seen status post right total knee arthroplasty.  Has 1 remaining visit with her home health physical therapy.  She is pleased with her progress.  Ortho Exam On examination of the right lower extremity there is mild edema. her incision is well healed.  Staples harvested today without incident no erythema no drainage.  Full extension, flexion to 90 degrees  Visit Diagnoses:  1. Total knee replacement status, right     Plan: Continue physical therapy an order was sent to [facility] outpatient physical therapy.  Staples harvested she will follow-up in 4 weeks  Follow-Up Instructions: No follow-ups on file.   Imaging: No results found.  Orders:  No orders of the defined types were placed in this encounter.  No orders of the defined types were placed in this encounter.    PMFS History: 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   Unilateral primary osteoarthritis, right knee    Medial meniscus, posterior horn derangement, right    De Quervain's syndrome (tenosynovitis)    DIABETES MELLITUS, UNCONTROLLED 07/22/2009   ANKLE, ARTHRITIS, DEGEN./OSTEO 08/21/2008   ADJUSTMENT DISORDER WITH DEPRESSED MOOD 08/13/2008   ALLERGIC RHINITIS 05/15/2008   LEG PAIN, CHRONIC 10/05/2007   LEUKOCYTOSIS 04/05/2007   SORE THROAT 04/05/2007   SARCOIDOSIS 01/29/2007   OBESITY, MORBID 01/29/2007   OBSTRUCTIVE SLEEP APNEA 01/29/2007   MIGRAINE HEADACHE 01/29/2007   HYPERTENSION 01/29/2007   OSTEOARTHRITIS 01/29/2007   Past Medical History:  Diagnosis Date   Arthritis    Diabetes mellitus without complication (HCC)    GERD  (gastroesophageal reflux disease)    Hypertension    Sarcoidosis    Sleep apnea     History reviewed. No pertinent family history.  Past Surgical History:  Procedure Laterality Date   CHOLECYSTECTOMY     15 years ago [facility]   DORSAL COMPARTMENT RELEASE Left 07/10/2015   Procedure: RELEASE DORSAL COMPARTMENT (DEQUERVAIN);  Surgeon: Vickki Hearing, MD;  Location: [facility];  Service: Orthopedics;  Laterality: Left;   KNEE ARTHROSCOPY WITH MEDIAL MENISECTOMY Right 10/20/2016   Procedure: KNEE ARTHROSCOPY WITH MEDIAL MENISECTOMY;  Surgeon: Vickki Hearing, MD;  Location: [facility];  Service: Orthopedics;  Laterality: Right;   ORIF ANKLE FRACTURE Right    TOTAL KNEE ARTHROPLASTY Right 11/05/2021   Procedure: RIGHT TOTAL KNEE ARTHROPLASTY;  Surgeon: Nadara Mustard, MD;  Location: [facility] OR;  Service: Orthopedics;  Laterality: Right;   Social History   Occupational History   Not on file  Tobacco Use   Smoking status: Former    Packs/day: 0.50    Years: 5.00    Pack years: 2.50    Types: Cigarettes    Quit date: 07/05/1994    Years since quitting: 27.4   Smokeless tobacco: Never  Substance and Sexual Activity   Alcohol use: No   Drug use: No   Sexual activity: Yes    Birth control/protection: Surgical

## 2021-11-24 ENCOUNTER — Ambulatory Visit (HOSPITAL_COMMUNITY): Payer: Medicare Other | Attending: Family | Admitting: Physical Therapy

## 2021-11-24 ENCOUNTER — Other Ambulatory Visit: Payer: Self-pay

## 2021-11-24 ENCOUNTER — Encounter (HOSPITAL_COMMUNITY): Payer: Self-pay | Admitting: Physical Therapy

## 2021-11-24 DIAGNOSIS — Z96651 Presence of right artificial knee joint: Secondary | ICD-10-CM | POA: Diagnosis not present

## 2021-11-24 DIAGNOSIS — R2689 Other abnormalities of gait and mobility: Secondary | ICD-10-CM | POA: Diagnosis present

## 2021-11-24 DIAGNOSIS — R262 Difficulty in walking, not elsewhere classified: Secondary | ICD-10-CM | POA: Insufficient documentation

## 2021-11-24 DIAGNOSIS — M25561 Pain in right knee: Secondary | ICD-10-CM | POA: Insufficient documentation

## 2021-11-24 DIAGNOSIS — M25661 Stiffness of right knee, not elsewhere classified: Secondary | ICD-10-CM | POA: Diagnosis present

## 2021-11-24 NOTE — Patient Instructions (Signed)
Access Code: [email or web address] URL: [email or web address] Date: 11/24/2021 Prepared by: Georges Lynch  Exercises Supine Gluteal Sets - 3 x daily - 7 x weekly - 2 sets - 10 reps Supine Calf Stretch with Strap - 3 x daily - 7 x weekly - 1 sets - 3 reps - 30 second hold Supine Heel Slide with Strap - 3 x daily - 7 x weekly - 2 sets - 10 reps - 5 second hold Heel Raises with Counter Support - 3 x daily - 7 x weekly - 2 sets - 10 reps Supine Knee Extension Mobilization with Weight - 3 x daily - 7 x weekly - 1 sets - 1 reps - 3-5 minutes hold

## 2021-11-24 NOTE — Therapy (Signed)
[facility] [address] [place], [place], [address] Phone: [phone]   Fax:  [phone]  Physical Therapy Evaluation  Patient Details  Name: Terri Logan MRN: [id number] Date of Birth: 02-11-1966 Referring Provider (PT): Barnie Del NP   Encounter Date: 11/24/2021   PT End of Session - 11/24/21 1501     Visit Number 1    Number of Visits 18    Date for PT Re-Evaluation 01/05/22    Authorization Type Medicare A/ Medicaid 2nd    Progress Note Due on Visit 10    PT Start Time 1433    PT Stop Time 1510    PT Time Calculation (min) 37 min    Activity Tolerance Patient tolerated treatment well;Patient limited by pain    Behavior During Therapy [facility] for tasks assessed/performed             Past Medical History:  Diagnosis Date   Arthritis    Diabetes mellitus without complication (HCC)    GERD (gastroesophageal reflux disease)    Hypertension    Sarcoidosis    Sleep apnea     Past Surgical History:  Procedure Laterality Date   CHOLECYSTECTOMY     15 years ago [facility]   DORSAL COMPARTMENT RELEASE Left 07/10/2015   Procedure: RELEASE DORSAL COMPARTMENT (DEQUERVAIN);  Surgeon: Vickki Hearing, MD;  Location: [facility];  Service: Orthopedics;  Laterality: Left;   KNEE ARTHROSCOPY WITH MEDIAL MENISECTOMY Right 10/20/2016   Procedure: KNEE ARTHROSCOPY WITH MEDIAL MENISECTOMY;  Surgeon: Vickki Hearing, MD;  Location: [facility];  Service: Orthopedics;  Laterality: Right;   ORIF ANKLE FRACTURE Right    TOTAL KNEE ARTHROPLASTY Right 11/05/2021   Procedure: RIGHT TOTAL KNEE ARTHROPLASTY;  Surgeon: Nadara Mustard, MD;  Location: [facility] OR;  Service: Orthopedics;  Laterality: Right;    There were no vitals filed for this visit.    Subjective Assessment - 11/24/21 1437     Subjective Patient presents to therapy with complaint of RT knee pain s/p RT TKA 11/05/21. She has been having a lot of pain. She reports problems with her LT  knee also but wants to have this repaired sometime next year. She had HH for 2 weeks. Did well with this. She is ambulating with RW currently. She is managing pain level with medication and regular icing.    Pertinent History RT TKA 11/05/21    Limitations Lifting;Standing;Walking;House hold activities    Patient Stated Goals Get back full ROM, weight loss, get active    Currently in Pain? Yes    Pain Score 4     Pain Location Knee    Pain Orientation Right    Pain Descriptors / Indicators Aching;Constant    Pain Type Surgical pain    Pain Onset 1 to 4 weeks ago    Pain Frequency Constant    Aggravating Factors  WB, standing, walking    Pain Relieving Factors meds, rest, ice    Effect of Pain on Daily Activities Limits    Pain Type Surgical pain                [facility] PT Assessment - 11/24/21 0001       Assessment   Medical Diagnosis RT TKA    Referring Provider (PT) Barnie Del NP    Onset Date/Surgical Date 11/05/21    Prior Therapy 12/21/21      Precautions   Precautions None      Restrictions  Weight Bearing Restrictions No      Balance Screen   Has the patient fallen in the past 6 months No      Home Environment   Living Environment Private residence      Prior Function   Level of Independence Independent      Cognition   Overall Cognitive Status Within Functional Limits for tasks assessed      Observation/Other Assessments   Observations Bandages intact    Focus on Therapeutic Outcomes (FOTO)  22% function      Observation/Other Assessments-Edema    Edema --   Mod edema noted about RT knee     ROM / Strength   AROM / PROM / Strength AROM;Strength      AROM   AROM Assessment Site Knee    Right/Left Knee Right;Left    Right Knee Extension 10    Right Knee Flexion 85      Strength   Strength Assessment Site Knee;Hip    Right/Left Hip Right;Left    Right Hip Flexion 4/5    Left Hip Flexion 5/5    Right/Left Knee Right;Left    Right Knee  Flexion 4/5    Right Knee Extension 3+/5    Left Knee Flexion 5/5    Left Knee Extension 5/5      Ambulation/Gait   Ambulation/Gait Yes    Ambulation/Gait Assistance 5: Supervision;4: Min guard    Assistive device Rolling walker    Gait Pattern Decreased stride length;Decreased stance time - right;Decreased step length - left;Decreased hip/knee flexion - right;Antalgic;Trunk flexed    Ambulation Surface Level;Indoor                        Objective measurements completed on examination: See above findings.                PT Education - 11/24/21 1439     Education Details on evalution findings, POC and HEP    Person(s) Educated Patient    Methods Explanation;Handout    Comprehension Verbalized understanding              PT Short Term Goals - 11/24/21 1505       PT SHORT TERM GOAL #1   Title Patient will be independent with initial HEP and self-management strategies to improve functional outcomes    Time 3    Period Weeks    Status New    Target Date 12/15/21      PT SHORT TERM GOAL #2   Title Patient will have RT knee AROM 5-100 degrees to improve functional mobility and facilitate squatting to pick up items from floor.    Time 3    Period Weeks    Status New    Target Date 12/15/21               PT Long Term Goals - 11/24/21 1506       PT LONG TERM GOAL #1   Title Patient will have RT knee AROM 0-120 degrees to improve functional mobility and facilitate squatting to pick up items from floor.    Time 6    Period Weeks    Status New    Target Date 01/05/22      PT LONG TERM GOAL #2   Title Patient will improve FOTO score to predicted value to indicate improvement in functional outcomes    Time 6    Period Weeks    Status New  Target Date 01/05/22      PT LONG TERM GOAL #3   Title Patient will report at least 75% overall improvement in subjective complaint to indicate improvement in ability to perform ADLs.    Time 6     Period Weeks    Status New    Target Date 01/05/22      PT LONG TERM GOAL #4   Title Patient will have equal to or > 4+/5 MMT throughout RLE to improve ability to perform functional mobility, stair ambulation and ADLs.    Time 6    Period Weeks    Status New    Target Date 01/05/22      PT LONG TERM GOAL #5   Title Patient will be able to ambulate at least 375 feet during with LRAD to demonstrate improved ability to perform functional mobility and associated tasks.    Time 6    Period Weeks    Status New    Target Date 01/05/22                    Plan - 11/24/21 1502     Clinical Impression Statement Patient is a 56 y.o. female who presents to physical therapy with complaint of RT knee pain s/p RT TKA. Patient demonstrates decreased strength, ROM restriction, balance deficits and gait abnormalities which are likely contributing to symptoms of pain and are negatively impacting patient ability to perform ADLs and functional mobility tasks. Patient will benefit from skilled physical therapy services to address these deficits to reduce pain, improve level of function with ADLs, functional mobility tasks, and reduce risk for falls.    Personal Factors and Comorbidities Comorbidity 3+    Examination-Activity Limitations Transfers;Hygiene/Grooming;Dressing;Stand;Carry;Stairs;Squat;Bend;Sleep;Bed Mobility;Locomotion Level;Lift    Examination-Participation Restrictions Cleaning;Community Activity;Driving;Yard Work;Laundry;Shop    Stability/Clinical Decision Making Stable/Uncomplicated    Clinical Decision Making Low    Rehab Potential Good    PT Frequency 3x / week    PT Duration 6 weeks    PT Treatment/Interventions ADLs/Self Care Home Management;Biofeedback;Cryotherapy;Stair training;Gait training;Orthotic Fit/Training;Patient/family education;DME Instruction;Functional mobility training;Therapeutic activities;Iontophoresis /ml Dexamethasone;Therapeutic exercise;Moist  Heat;Electrical Stimulation;Traction;Manual lymph drainage;Balance training;Manual techniques;Vasopneumatic Device;Taping;Energy conservation;Splinting;Dry needling;Neuromuscular re-education;Parrafin;Fluidtherapy;Contrast Bath;Ultrasound;Compression bandaging;Visual/perceptual remediation/compensation;Scar mobilization;Passive range of motion;Spinal Manipulations;Joint Manipulations    PT Next Visit Plan Review HEP. Progress glute and knee strength and mobility as tolerated. Manual edema and STM as needed for pain and restriction.    PT Home Exercise Plan HEP Review: heel prop, glute set, heel slide with strap, calf stretch with strap    Consulted and Agree with Plan of Care Patient             Patient will benefit from skilled therapeutic intervention in order to improve the following deficits and impairments:  Abnormal gait, Decreased endurance, Hypomobility, Increased edema, Decreased activity tolerance, Decreased strength, Increased fascial restricitons, Pain, Decreased balance, Decreased mobility, Difficulty walking, Impaired flexibility, Decreased range of motion, Improper body mechanics  Visit Diagnosis: Right knee pain, unspecified chronicity  Stiffness of right knee, not elsewhere classified  Other abnormalities of gait and mobility  Difficulty in walking, not elsewhere classified     Problem List 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   Unilateral primary osteoarthritis, right knee    Medial meniscus, posterior horn derangement, right    De Quervain's syndrome (tenosynovitis)    DIABETES MELLITUS, UNCONTROLLED 07/22/2009   ANKLE, ARTHRITIS, DEGEN./OSTEO 08/21/2008   ADJUSTMENT DISORDER WITH DEPRESSED MOOD 08/13/2008  ALLERGIC RHINITIS 05/15/2008   LEG PAIN, CHRONIC 10/05/2007   LEUKOCYTOSIS 04/05/2007   SORE THROAT 04/05/2007   SARCOIDOSIS 01/29/2007   OBESITY, MORBID 01/29/2007   OBSTRUCTIVE SLEEP  APNEA 01/29/2007   MIGRAINE HEADACHE 01/29/2007   HYPERTENSION 01/29/2007   OSTEOARTHRITIS 01/29/2007   3:10 PM, 11/24/21 Georges Lynch PT DPT  Physical Therapist with [place]  [facility]  [phone]   [facility] [address] [place], [place], [address] Phone: [phone]   Fax:  [phone]  Name: Terri Logan MRN: [id number] Date of Birth: Nov 16, 1966

## 2021-11-26 ENCOUNTER — Ambulatory Visit (HOSPITAL_COMMUNITY): Payer: Medicare Other | Attending: Family

## 2021-11-26 ENCOUNTER — Other Ambulatory Visit: Payer: Self-pay

## 2021-11-26 ENCOUNTER — Encounter (HOSPITAL_COMMUNITY): Payer: Self-pay

## 2021-11-26 DIAGNOSIS — M25561 Pain in right knee: Secondary | ICD-10-CM | POA: Diagnosis present

## 2021-11-26 DIAGNOSIS — R2689 Other abnormalities of gait and mobility: Secondary | ICD-10-CM | POA: Insufficient documentation

## 2021-11-26 DIAGNOSIS — R262 Difficulty in walking, not elsewhere classified: Secondary | ICD-10-CM | POA: Insufficient documentation

## 2021-11-26 DIAGNOSIS — M25661 Stiffness of right knee, not elsewhere classified: Secondary | ICD-10-CM | POA: Insufficient documentation

## 2021-11-26 NOTE — Therapy (Signed)
[facility] [address] [place], [place], [address] Phone: [phone]   Fax:  [phone]  Physical Therapy Treatment  Patient Details  Name: Terri Logan MRN: [id number] Date of Birth: Sep 11, 1966 Referring Provider (PT): Barnie Del NP   Encounter Date: 11/26/2021   PT End of Session - 11/26/21 1149     Visit Number 2    Number of Visits 18    Date for PT Re-Evaluation 01/05/22    Authorization Type Medicare A/ Medicaid 2nd    Progress Note Due on Visit 10    PT Start Time 1143   late arrival   PT Stop Time 1222    PT Time Calculation (min) 39 min    Activity Tolerance Patient tolerated treatment well;Patient limited by pain    Behavior During Therapy [facility] for tasks assessed/performed             Past Medical History:  Diagnosis Date   Arthritis    Diabetes mellitus without complication (HCC)    GERD (gastroesophageal reflux disease)    Hypertension    Sarcoidosis    Sleep apnea     Past Surgical History:  Procedure Laterality Date   CHOLECYSTECTOMY     15 years ago [facility]   DORSAL COMPARTMENT RELEASE Left 07/10/2015   Procedure: RELEASE DORSAL COMPARTMENT (DEQUERVAIN);  Surgeon: Vickki Hearing, MD;  Location: [facility];  Service: Orthopedics;  Laterality: Left;   KNEE ARTHROSCOPY WITH MEDIAL MENISECTOMY Right 10/20/2016   Procedure: KNEE ARTHROSCOPY WITH MEDIAL MENISECTOMY;  Surgeon: Vickki Hearing, MD;  Location: [facility];  Service: Orthopedics;  Laterality: Right;   ORIF ANKLE FRACTURE Right    TOTAL KNEE ARTHROPLASTY Right 11/05/2021   Procedure: RIGHT TOTAL KNEE ARTHROPLASTY;  Surgeon: Nadara Mustard, MD;  Location: [facility] OR;  Service: Orthopedics;  Laterality: Right;    There were no vitals filed for this visit.   Subjective Assessment - 11/26/21 1147     Subjective Pt stated she applied ice prior session for pain scale, leg is stiff and sore.  Has began HEP at home.    Pertinent History RT TKA 11/05/21     Patient Stated Goals Get back full ROM, weight loss, get active    Currently in Pain? Yes    Pain Score 2     Pain Location Knee    Pain Orientation Right    Pain Descriptors / Indicators Aching;Sore;Tightness    Pain Type Surgical pain    Pain Onset 1 to 4 weeks ago    Pain Frequency Constant    Aggravating Factors  WB, standing, walking    Pain Relieving Factors meds, rest, ice    Effect of Pain on Daily Activities limits                [facility] PT Assessment - 11/26/21 0001       Assessment   Medical Diagnosis RT TKA    Referring Provider (PT) Barnie Del NP    Onset Date/Surgical Date 11/05/21    Next MD Visit 12/21/21    Prior Therapy no      Precautions   Precautions None                           [facility] Adult PT Treatment/Exercise - 11/26/21 0001       Exercises   Exercises Knee/Hip      Knee/Hip Exercises: Seated   Heel Slides 10  reps      Knee/Hip Exercises: Supine   Quad Sets 10 reps    Short Arc Quad Sets Right;10 reps    Heel Slides 10 reps    Straight Leg Raises AAROM;5 reps    Knee Extension AROM    Knee Extension Limitations 8    Knee Flexion AROM    Knee Flexion Limitations 90.      Manual Therapy   Manual Therapy Edema management    Manual therapy comments Manual complete separate than rest of tx    Edema Management Decongestive technique for edema control wiht LE elevated                     PT Education - 11/26/21 1205     Education Details Reviewed goals, educated importance of HEP compliance for maximal beneftis, encouraged to add 2 wheels to RW to improve gait mechanics.    Person(s) Educated Patient    Methods Explanation;Demonstration    Comprehension Verbalized understanding;Returned demonstration              PT Short Term Goals - 11/24/21 1505       PT SHORT TERM GOAL #1   Title Patient will be independent with initial HEP and self-management strategies to improve functional outcomes     Time 3    Period Weeks    Status New    Target Date 12/15/21      PT SHORT TERM GOAL #2   Title Patient will have RT knee AROM 5-100 degrees to improve functional mobility and facilitate squatting to pick up items from floor.    Time 3    Period Weeks    Status New    Target Date 12/15/21               PT Long Term Goals - 11/24/21 1506       PT LONG TERM GOAL #1   Title Patient will have RT knee AROM 0-120 degrees to improve functional mobility and facilitate squatting to pick up items from floor.    Time 6    Period Weeks    Status New    Target Date 01/05/22      PT LONG TERM GOAL #2   Title Patient will improve FOTO score to predicted value to indicate improvement in functional outcomes    Time 6    Period Weeks    Status New    Target Date 01/05/22      PT LONG TERM GOAL #3   Title Patient will report at least 75% overall improvement in subjective complaint to indicate improvement in ability to perform ADLs.    Time 6    Period Weeks    Status New    Target Date 01/05/22      PT LONG TERM GOAL #4   Title Patient will have equal to or > 4+/5 MMT throughout RLE to improve ability to perform functional mobility, stair ambulation and ADLs.    Time 6    Period Weeks    Status New    Target Date 01/05/22      PT LONG TERM GOAL #5   Title Patient will be able to ambulate at least 375 feet during with LRAD to demonstrate improved ability to perform functional mobility and associated tasks.    Time 6    Period Weeks    Status New    Target Date 01/05/22  Plan - 11/26/21 1227     Clinical Impression Statement Pt arrived with standard walker, encouraged to purchase 2 front wheels to assist with gait mechanics.  Reviewed goals and educated importance of HEP compliance, pt reports compliance and ability to demosntrate/verbalize current exercise program.  Began session with manual retrograde massage for edema control.  Therex focus on  ROM.  AROM improved 8-90 degrees.  Reviewed RICE technqiues for edema and pain control.    Personal Factors and Comorbidities Comorbidity 3+    Examination-Activity Limitations Transfers;Hygiene/Grooming;Dressing;Stand;Carry;Stairs;Squat;Bend;Sleep;Bed Mobility;Locomotion Level;Lift    Examination-Participation Restrictions Cleaning;Community Activity;Driving;Yard Work;Laundry;Shop    Stability/Clinical Decision Making Stable/Uncomplicated    Rehab Potential Good    PT Frequency 3x / week    PT Duration 6 weeks    PT Treatment/Interventions ADLs/Self Care Home Management;Biofeedback;Cryotherapy;Stair training;Gait training;Orthotic Fit/Training;Patient/family education;DME Instruction;Functional mobility training;Therapeutic activities;Iontophoresis 4mg /ml Dexamethasone;Therapeutic exercise;Moist Heat;Electrical Stimulation;Traction;Manual lymph drainage;Balance training;Manual techniques;Vasopneumatic Device;Taping;Energy conservation;Splinting;Dry needling;Neuromuscular re-education;Parrafin;Fluidtherapy;Contrast Bath;Ultrasound;Compression bandaging;Visual/perceptual remediation/compensation;Scar mobilization;Passive range of motion;Spinal Manipulations;Joint Manipulations    PT Next Visit Plan ROM based exercises and progress glute and knee strength and mobility as tolerated. Manual edema and STM as needed for pain and restriction.    PT Home Exercise Plan HEP Review: heel prop, glute set, heel slide with strap, calf stretch with strap    Consulted and Agree with Plan of Care Patient             Patient will benefit from skilled therapeutic intervention in order to improve the following deficits and impairments:  Abnormal gait, Decreased endurance, Hypomobility, Increased edema, Decreased activity tolerance, Decreased strength, Increased fascial restricitons, Pain, Decreased balance, Decreased mobility, Difficulty walking, Impaired flexibility, Decreased range of motion, Improper body  mechanics  Visit Diagnosis: Right knee pain, unspecified chronicity  Stiffness of right knee, not elsewhere classified  Other abnormalities of gait and mobility  Difficulty in walking, not elsewhere classified     Problem List 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   Unilateral primary osteoarthritis, right knee    Medial meniscus, posterior horn derangement, right    De Quervain's syndrome (tenosynovitis)    DIABETES MELLITUS, UNCONTROLLED 07/22/2009   ANKLE, ARTHRITIS, DEGEN./OSTEO 08/21/2008   ADJUSTMENT DISORDER WITH DEPRESSED MOOD 08/13/2008   ALLERGIC RHINITIS 05/15/2008   LEG PAIN, CHRONIC 10/05/2007   LEUKOCYTOSIS 04/05/2007   SORE THROAT 04/05/2007   SARCOIDOSIS 01/29/2007   OBESITY, MORBID 01/29/2007   OBSTRUCTIVE SLEEP APNEA 01/29/2007   MIGRAINE HEADACHE 01/29/2007   HYPERTENSION 01/29/2007   OSTEOARTHRITIS 01/29/2007   03/30/2007, LPTA/CLT; CBIS [phone]  [phone], PTA 11/26/2021, 12:32 PM  [facility] [address] [place], [place], [place] Phone: [phone]   Fax:  [phone]  Name: Terri Logan MRN: [name] Date of Birth: 1966/12/12

## 2021-11-28 ENCOUNTER — Other Ambulatory Visit: Payer: Self-pay | Admitting: Orthopedic Surgery

## 2021-11-30 ENCOUNTER — Ambulatory Visit (HOSPITAL_COMMUNITY): Payer: Medicare Other | Admitting: Physical Therapy

## 2021-11-30 ENCOUNTER — Other Ambulatory Visit (INDEPENDENT_AMBULATORY_CARE_PROVIDER_SITE_OTHER): Payer: Self-pay | Admitting: Physician Assistant

## 2021-12-01 ENCOUNTER — Other Ambulatory Visit: Payer: Self-pay

## 2021-12-01 ENCOUNTER — Ambulatory Visit (HOSPITAL_COMMUNITY): Payer: Medicare Other | Admitting: Physical Therapy

## 2021-12-01 DIAGNOSIS — M25661 Stiffness of right knee, not elsewhere classified: Secondary | ICD-10-CM

## 2021-12-01 DIAGNOSIS — R2689 Other abnormalities of gait and mobility: Secondary | ICD-10-CM

## 2021-12-01 DIAGNOSIS — M25561 Pain in right knee: Secondary | ICD-10-CM | POA: Diagnosis not present

## 2021-12-01 DIAGNOSIS — R262 Difficulty in walking, not elsewhere classified: Secondary | ICD-10-CM

## 2021-12-01 NOTE — Therapy (Signed)
[facility] [address] [place], [place], [address] Phone: [phone]   Fax:  [phone]  Physical Therapy Treatment  Patient Details  Name: Terri Logan MRN: [id number] Date of Birth: 23-Jan-1966 Referring Provider (PT): Barnie Del NP   Encounter Date: 12/01/2021   PT End of Session - 12/01/21 1626     Visit Number 3    Number of Visits 18    Date for PT Re-Evaluation 01/05/22    Authorization Type Medicare A/ Medicaid 2nd    Progress Note Due on Visit 10    PT Start Time 1535    PT Stop Time 1620    PT Time Calculation (min) 45 min    Activity Tolerance Patient tolerated treatment well;Patient limited by pain    Behavior During Therapy [facility] for tasks assessed/performed             Past Medical History:  Diagnosis Date   Arthritis    Diabetes mellitus without complication (HCC)    GERD (gastroesophageal reflux disease)    Hypertension    Sarcoidosis    Sleep apnea     Past Surgical History:  Procedure Laterality Date   CHOLECYSTECTOMY     15 years ago [facility]   DORSAL COMPARTMENT RELEASE Left 07/10/2015   Procedure: RELEASE DORSAL COMPARTMENT (DEQUERVAIN);  Surgeon: Vickki Hearing, MD;  Location: [facility];  Service: Orthopedics;  Laterality: Left;   KNEE ARTHROSCOPY WITH MEDIAL MENISECTOMY Right 10/20/2016   Procedure: KNEE ARTHROSCOPY WITH MEDIAL MENISECTOMY;  Surgeon: Vickki Hearing, MD;  Location: [facility];  Service: Orthopedics;  Laterality: Right;   ORIF ANKLE FRACTURE Right    TOTAL KNEE ARTHROPLASTY Right 11/05/2021   Procedure: RIGHT TOTAL KNEE ARTHROPLASTY;  Surgeon: Nadara Mustard, MD;  Location: [facility] OR;  Service: Orthopedics;  Laterality: Right;    There were no vitals filed for this visit.   Subjective Assessment - 12/01/21 1543     Subjective pt comes today with wheeled attachements on RW.  STates she's only having minimal discomfort at 2/10.  States she is overall improving    Currently in  Pain? Yes    Pain Score 2     Pain Location Knee    Pain Orientation Right    Pain Descriptors / Indicators Aching;Sore    Multiple Pain Sites No                               [facility] Adult PT Treatment/Exercise - 12/01/21 0001       Knee/Hip Exercises: Stretches   Active Hamstring Stretch Right;3 reps;30 seconds    Active Hamstring Stretch Limitations standing 12" box    Knee: Self-Stretch to increase Flexion Right;10 seconds    Knee: Self-Stretch Limitations 10 reps onto 12" step    Gastroc Stretch Both;3 reps;30 seconds    Gastroc Stretch Limitations slant board      Knee/Hip Exercises: Aerobic   Stationary Bike rocking 4 minutes seat 19      Knee/Hip Exercises: Standing   Heel Raises Both;15 reps    Knee Flexion Right;15 reps      Knee/Hip Exercises: Supine   Quad Sets 10 reps    Short Arc Quad Sets Right;10 reps    Heel Slides 10 reps    Straight Leg Raises AAROM;10 reps    Knee Extension AROM    Knee Extension Limitations 4    Knee Flexion AROM  Knee Flexion Limitations 92      Manual Therapy   Manual Therapy Soft tissue mobilization    Manual therapy comments Manual complete separate than rest of tx    Soft tissue mobilization to reduce scar tissue and adhesions                     PT Education - 12/01/21 1627     Education Details educated on compression stockings and scar massage    Person(s) Educated Patient;Spouse    Methods Explanation;Demonstration;Handout    Comprehension Verbalized understanding;Returned demonstration              PT Short Term Goals - 11/24/21 1505       PT SHORT TERM GOAL #1   Title Patient will be independent with initial HEP and self-management strategies to improve functional outcomes    Time 3    Period Weeks    Status New    Target Date 12/15/21      PT SHORT TERM GOAL #2   Title Patient will have RT knee AROM 5-100 degrees to improve functional mobility and facilitate squatting  to pick up items from floor.    Time 3    Period Weeks    Status New    Target Date 12/15/21               PT Long Term Goals - 11/24/21 1506       PT LONG TERM GOAL #1   Title Patient will have RT knee AROM 0-120 degrees to improve functional mobility and facilitate squatting to pick up items from floor.    Time 6    Period Weeks    Status New    Target Date 01/05/22      PT LONG TERM GOAL #2   Title Patient will improve FOTO score to predicted value to indicate improvement in functional outcomes    Time 6    Period Weeks    Status New    Target Date 01/05/22      PT LONG TERM GOAL #3   Title Patient will report at least 75% overall improvement in subjective complaint to indicate improvement in ability to perform ADLs.    Time 6    Period Weeks    Status New    Target Date 01/05/22      PT LONG TERM GOAL #4   Title Patient will have equal to or > 4+/5 MMT throughout RLE to improve ability to perform functional mobility, stair ambulation and ADLs.    Time 6    Period Weeks    Status New    Target Date 01/05/22      PT LONG TERM GOAL #5   Title Patient will be able to ambulate at least 375 feet during with LRAD to demonstrate improved ability to perform functional mobility and associated tasks.    Time 6    Period Weeks    Status New    Target Date 01/05/22                   Plan - 12/01/21 1627     Clinical Impression Statement Began session with bike to help improve ROM.  Educated on compression garments as pt with compression strap around her Rt knee.  Encouraged to get f8ull compression for Rt LE and given measurements and information on where to get these.  Scar massage completed to Rt knee with most scar tissue situation at  proximal end of scar.  Encouraged to complete self-massage or have spouse do this to help break up scar tissue.  Informed of scar silicone sheets as well.  ROM slowly improving at 4-92 today (was 8-90)    Personal Factors  and Comorbidities Comorbidity 3+    Examination-Activity Limitations Transfers;Hygiene/Grooming;Dressing;Stand;Carry;Stairs;Squat;Bend;Sleep;Bed Mobility;Locomotion Level;Lift    Examination-Participation Restrictions Cleaning;Community Activity;Driving;Yard Work;Laundry;Shop    Stability/Clinical Decision Making Stable/Uncomplicated    Rehab Potential Good    PT Frequency 3x / week    PT Duration 6 weeks    PT Treatment/Interventions ADLs/Self Care Home Management;Biofeedback;Cryotherapy;Stair training;Gait training;Orthotic Fit/Training;Patient/family education;DME Instruction;Functional mobility training;Therapeutic activities;Iontophoresis 4mg /ml Dexamethasone;Therapeutic exercise;Moist Heat;Electrical Stimulation;Traction;Manual lymph drainage;Balance training;Manual techniques;Vasopneumatic Device;Taping;Energy conservation;Splinting;Dry needling;Neuromuscular re-education;Parrafin;Fluidtherapy;Contrast Bath;Ultrasound;Compression bandaging;Visual/perceptual remediation/compensation;Scar mobilization;Passive range of motion;Spinal Manipulations;Joint Manipulations    PT Next Visit Plan ROM based exercises and progress glute and knee strength and mobility as tolerated. Manual edema and STM as needed for pain and restriction.    PT Home Exercise Plan HEP Review: heel prop, glute set, heel slide with strap, calf stretch with strap    Consulted and Agree with Plan of Care Patient             Patient will benefit from skilled therapeutic intervention in order to improve the following deficits and impairments:  Abnormal gait, Decreased endurance, Hypomobility, Increased edema, Decreased activity tolerance, Decreased strength, Increased fascial restricitons, Pain, Decreased balance, Decreased mobility, Difficulty walking, Impaired flexibility, Decreased range of motion, Improper body mechanics  Visit Diagnosis: Right knee pain, unspecified chronicity  Stiffness of right knee, not elsewhere  classified  Other abnormalities of gait and mobility  Difficulty in walking, not elsewhere classified     Problem List 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   Unilateral primary osteoarthritis, right knee    Medial meniscus, posterior horn derangement, right    De Quervain's syndrome (tenosynovitis)    DIABETES MELLITUS, UNCONTROLLED 07/22/2009   ANKLE, ARTHRITIS, DEGEN./OSTEO 08/21/2008   ADJUSTMENT DISORDER WITH DEPRESSED MOOD 08/13/2008   ALLERGIC RHINITIS 05/15/2008   LEG PAIN, CHRONIC 10/05/2007   LEUKOCYTOSIS 04/05/2007   SORE THROAT 04/05/2007   SARCOIDOSIS 01/29/2007   OBESITY, MORBID 01/29/2007   OBSTRUCTIVE SLEEP APNEA 01/29/2007   MIGRAINE HEADACHE 01/29/2007   HYPERTENSION 01/29/2007   OSTEOARTHRITIS 01/29/2007   Paula Busenbark 03/30/2007, PTA/CLT, WTA [phone]  [phone], PTA 12/01/2021, 4:28 PM  [facility] [address] [place], [place], [place] Phone: [phone]   Fax:  [phone]  Name: SEKAI NAYAK MRN: [name] Date of Birth: 07-21-1966

## 2021-12-02 ENCOUNTER — Other Ambulatory Visit: Payer: Self-pay | Admitting: Orthopedic Surgery

## 2021-12-03 ENCOUNTER — Encounter (HOSPITAL_COMMUNITY): Payer: Medicare Other

## 2021-12-06 ENCOUNTER — Ambulatory Visit (HOSPITAL_COMMUNITY): Payer: Medicare Other | Admitting: Physical Therapy

## 2021-12-06 ENCOUNTER — Other Ambulatory Visit: Payer: Self-pay

## 2021-12-06 DIAGNOSIS — R262 Difficulty in walking, not elsewhere classified: Secondary | ICD-10-CM

## 2021-12-06 DIAGNOSIS — R2689 Other abnormalities of gait and mobility: Secondary | ICD-10-CM

## 2021-12-06 DIAGNOSIS — M25561 Pain in right knee: Secondary | ICD-10-CM

## 2021-12-06 DIAGNOSIS — M25661 Stiffness of right knee, not elsewhere classified: Secondary | ICD-10-CM

## 2021-12-06 MED ORDER — OXYCODONE-ACETAMINOPHEN 5-325 MG PO TABS: 1.0000 | ORAL_TABLET | ORAL | 0 refills | Status: DC | PRN

## 2021-12-06 NOTE — Therapy (Signed)
[facility] [address] [place], [place], [address] Phone: [phone]   Fax:  [phone]  Physical Therapy Treatment  Patient Details  Name: Terri Logan MRN: [id number] Date of Birth: 1966/03/27 Referring Provider (PT): Barnie Del NP   Encounter Date: 12/06/2021   PT End of Session - 12/06/21 1419     Visit Number 4    Number of Visits 18    Date for PT Re-Evaluation 01/05/22    Authorization Type Medicare A/ Medicaid 2nd    Progress Note Due on Visit 10    PT Start Time 1404    PT Stop Time 1443    PT Time Calculation (min) 39 min    Activity Tolerance Patient tolerated treatment well;Patient limited by pain    Behavior During Therapy [facility] for tasks assessed/performed             Past Medical History:  Diagnosis Date   Arthritis    Diabetes mellitus without complication (HCC)    GERD (gastroesophageal reflux disease)    Hypertension    Sarcoidosis    Sleep apnea     Past Surgical History:  Procedure Laterality Date   CHOLECYSTECTOMY     15 years ago [facility]   DORSAL COMPARTMENT RELEASE Left 07/10/2015   Procedure: RELEASE DORSAL COMPARTMENT (DEQUERVAIN);  Surgeon: Vickki Hearing, MD;  Location: [facility];  Service: Orthopedics;  Laterality: Left;   KNEE ARTHROSCOPY WITH MEDIAL MENISECTOMY Right 10/20/2016   Procedure: KNEE ARTHROSCOPY WITH MEDIAL MENISECTOMY;  Surgeon: Vickki Hearing, MD;  Location: [facility];  Service: Orthopedics;  Laterality: Right;   ORIF ANKLE FRACTURE Right    TOTAL KNEE ARTHROPLASTY Right 11/05/2021   Procedure: RIGHT TOTAL KNEE ARTHROPLASTY;  Surgeon: Nadara Mustard, MD;  Location: [facility] OR;  Service: Orthopedics;  Laterality: Right;    There were no vitals filed for this visit.                      [facility] Adult PT Treatment/Exercise - 12/06/21 0001       Knee/Hip Exercises: Stretches   Active Hamstring Stretch Right;3 reps;30 seconds    Active Hamstring Stretch  Limitations standing 12" box    Knee: Self-Stretch to increase Flexion Right;10 seconds    Knee: Self-Stretch Limitations 10 reps onto 12" step    Gastroc Stretch Both;3 reps;30 seconds    Gastroc Stretch Limitations slant board      Knee/Hip Exercises: Aerobic   Stationary Bike full rev 4' seat 19      Knee/Hip Exercises: Standing   Heel Raises Both;15 reps    Knee Flexion Right;15 reps    Gait Training 200 feet with SPC      Knee/Hip Exercises: Seated   Long Arc Quad Right;10 reps    Long Arc Quad Limitations 5" holds      Knee/Hip Exercises: Supine   Quad Sets 15 reps    Short Arc Quad Sets Right;15 reps    Heel Slides Right;15 reps    Straight Leg Raises AAROM;10 reps    Knee Extension AROM    Knee Extension Limitations 2    Knee Flexion AROM    Knee Flexion Limitations 105                       PT Short Term Goals - 11/24/21 1505       PT SHORT TERM GOAL #1   Title Patient will be  independent with initial HEP and self-management strategies to improve functional outcomes    Time 3    Period Weeks    Status New    Target Date 12/15/21      PT SHORT TERM GOAL #2   Title Patient will have RT knee AROM 5-100 degrees to improve functional mobility and facilitate squatting to pick up items from floor.    Time 3    Period Weeks    Status New    Target Date 12/15/21               PT Long Term Goals - 11/24/21 1506       PT LONG TERM GOAL #1   Title Patient will have RT knee AROM 0-120 degrees to improve functional mobility and facilitate squatting to pick up items from floor.    Time 6    Period Weeks    Status New    Target Date 01/05/22      PT LONG TERM GOAL #2   Title Patient will improve FOTO score to predicted value to indicate improvement in functional outcomes    Time 6    Period Weeks    Status New    Target Date 01/05/22      PT LONG TERM GOAL #3   Title Patient will report at least 75% overall improvement in subjective  complaint to indicate improvement in ability to perform ADLs.    Time 6    Period Weeks    Status New    Target Date 01/05/22      PT LONG TERM GOAL #4   Title Patient will have equal to or > 4+/5 MMT throughout RLE to improve ability to perform functional mobility, stair ambulation and ADLs.    Time 6    Period Weeks    Status New    Target Date 01/05/22      PT LONG TERM GOAL #5   Title Patient will be able to ambulate at least 375 feet during with LRAD to demonstrate improved ability to perform functional mobility and associated tasks.    Time 6    Period Weeks    Status New    Target Date 01/05/22                   Plan - 12/06/21 1442     Clinical Impression Statement Began with bike this session.  Pt able to make full revolutions from beginning indicating improved ROM today.  Pt has received her thigh high garments today in the mail and has ordered some silicone scar sheets she will start using tomorrow.  ROM improved today 2-105 AROM.   Progressed with SLR and bridges today in supine.  Encouraged to complete exercises on Lt as well to prepare for future surgery on that LE.  Began ambulation using SPC today with good cadence, stability and sequencing.    Personal Factors and Comorbidities Comorbidity 3+    Examination-Activity Limitations Transfers;Hygiene/Grooming;Dressing;Stand;Carry;Stairs;Squat;Bend;Sleep;Bed Mobility;Locomotion Level;Lift    Examination-Participation Restrictions Cleaning;Community Activity;Driving;Yard Work;Laundry;Shop    Stability/Clinical Decision Making Stable/Uncomplicated    Rehab Potential Good    PT Frequency 3x / week    PT Duration 6 weeks    PT Treatment/Interventions ADLs/Self Care Home Management;Biofeedback;Cryotherapy;Stair training;Gait training;Orthotic Fit/Training;Patient/family education;DME Instruction;Functional mobility training;Therapeutic activities;Iontophoresis 4mg /ml Dexamethasone;Therapeutic exercise;Moist  Heat;Electrical Stimulation;Traction;Manual lymph drainage;Balance training;Manual techniques;Vasopneumatic Device;Taping;Energy conservation;Splinting;Dry needling;Neuromuscular re-education;Parrafin;Fluidtherapy;Contrast Bath;Ultrasound;Compression bandaging;Visual/perceptual remediation/compensation;Scar mobilization;Passive range of motion;Spinal Manipulations;Joint Manipulations    PT Next Visit Plan ROM based exercises  and progress glute and knee strength and mobility as tolerated. Manual edema and STM as needed for pain and restriction.    PT Home Exercise Plan HEP Review: heel prop, glute set, heel slide with strap, calf stretch with strap    Consulted and Agree with Plan of Care Patient             Patient will benefit from skilled therapeutic intervention in order to improve the following deficits and impairments:  Abnormal gait, Decreased endurance, Hypomobility, Increased edema, Decreased activity tolerance, Decreased strength, Increased fascial restricitons, Pain, Decreased balance, Decreased mobility, Difficulty walking, Impaired flexibility, Decreased range of motion, Improper body mechanics  Visit Diagnosis: Right knee pain, unspecified chronicity  Stiffness of right knee, not elsewhere classified  Other abnormalities of gait and mobility  Difficulty in walking, not elsewhere classified     Problem List 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   Unilateral primary osteoarthritis, right knee    Medial meniscus, posterior horn derangement, right    De Quervain's syndrome (tenosynovitis)    DIABETES MELLITUS, UNCONTROLLED 07/22/2009   ANKLE, ARTHRITIS, DEGEN./OSTEO 08/21/2008   ADJUSTMENT DISORDER WITH DEPRESSED MOOD 08/13/2008   ALLERGIC RHINITIS 05/15/2008   LEG PAIN, CHRONIC 10/05/2007   LEUKOCYTOSIS 04/05/2007   SORE THROAT 04/05/2007   SARCOIDOSIS 01/29/2007   OBESITY, MORBID 01/29/2007    OBSTRUCTIVE SLEEP APNEA 01/29/2007   MIGRAINE HEADACHE 01/29/2007   HYPERTENSION 01/29/2007   OSTEOARTHRITIS 01/29/2007    Lurena Nida, PTA 12/06/2021, 2:45 PM  [facility] [address] [place], [place], [address] Phone: [phone]   Fax:  [phone]  Name: Terri Logan MRN: [id number] Date of Birth: Apr 11, 1966

## 2021-12-08 ENCOUNTER — Other Ambulatory Visit: Payer: Self-pay

## 2021-12-08 ENCOUNTER — Ambulatory Visit (HOSPITAL_COMMUNITY): Payer: Medicare Other

## 2021-12-08 ENCOUNTER — Encounter (HOSPITAL_COMMUNITY): Payer: Self-pay

## 2021-12-08 DIAGNOSIS — M25661 Stiffness of right knee, not elsewhere classified: Secondary | ICD-10-CM

## 2021-12-08 DIAGNOSIS — M25561 Pain in right knee: Secondary | ICD-10-CM | POA: Diagnosis not present

## 2021-12-08 DIAGNOSIS — R262 Difficulty in walking, not elsewhere classified: Secondary | ICD-10-CM

## 2021-12-08 DIAGNOSIS — R2689 Other abnormalities of gait and mobility: Secondary | ICD-10-CM

## 2021-12-08 NOTE — Therapy (Signed)
[facility] [address] [place], [place], [address] Phone: [phone]   Fax:  [phone]  Physical Therapy Treatment  Patient Details  Name: Terri Logan MRN: [id number] Date of Birth: 1966-09-16 Referring Provider (PT): Barnie Del NP   Encounter Date: 12/08/2021   PT End of Session - 12/08/21 1405     Visit Number 5    Number of Visits 18    Date for PT Re-Evaluation 01/05/22    Authorization Type Medicare A/ Medicaid 2nd    Progress Note Due on Visit 10    PT Start Time 1400    PT Stop Time 1440    PT Time Calculation (min) 40 min    Activity Tolerance Patient tolerated treatment well;Patient limited by pain    Behavior During Therapy [facility] for tasks assessed/performed             Past Medical History:  Diagnosis Date   Arthritis    Diabetes mellitus without complication (HCC)    GERD (gastroesophageal reflux disease)    Hypertension    Sarcoidosis    Sleep apnea     Past Surgical History:  Procedure Laterality Date   CHOLECYSTECTOMY     15 years ago [facility]   DORSAL COMPARTMENT RELEASE Left 07/10/2015   Procedure: RELEASE DORSAL COMPARTMENT (DEQUERVAIN);  Surgeon: Vickki Hearing, MD;  Location: [facility];  Service: Orthopedics;  Laterality: Left;   KNEE ARTHROSCOPY WITH MEDIAL MENISECTOMY Right 10/20/2016   Procedure: KNEE ARTHROSCOPY WITH MEDIAL MENISECTOMY;  Surgeon: Vickki Hearing, MD;  Location: [facility];  Service: Orthopedics;  Laterality: Right;   ORIF ANKLE FRACTURE Right    TOTAL KNEE ARTHROPLASTY Right 11/05/2021   Procedure: RIGHT TOTAL KNEE ARTHROPLASTY;  Surgeon: Nadara Mustard, MD;  Location: [facility] OR;  Service: Orthopedics;  Laterality: Right;    There were no vitals filed for this visit.   Subjective Assessment - 12/08/21 1402     Subjective Pt stated she has been wearing her compression garmets and reports improvements.  Has been walking with SPC at home.  Pain is 2/10 today.    Pertinent  History RT TKA 11/05/21    Patient Stated Goals Get back full ROM, weight loss, get active    Currently in Pain? Yes    Pain Score 2     Pain Location Knee    Pain Orientation Right    Pain Descriptors / Indicators Aching;Sore;Tightness    Pain Type Surgical pain    Pain Onset 1 to 4 weeks ago    Pain Frequency Constant    Aggravating Factors  WB, standing, walking    Pain Relieving Factors meds, rest, ice    Effect of Pain on Daily Activities limits                [facility] PT Assessment - 12/08/21 0001       Assessment   Medical Diagnosis RT TKA    Referring Provider (PT) Barnie Del NP    Onset Date/Surgical Date 11/05/21    Next MD Visit 12/21/21    Prior Therapy no      Precautions   Precautions None                           [facility] Adult PT Treatment/Exercise - 12/08/21 0001       Exercises   Exercises Knee/Hip      Knee/Hip Exercises: Stretches   Active Hamstring  Stretch Right;3 reps;30 seconds    Active Hamstring Stretch Limitations standing 12" box    Knee: Self-Stretch to increase Flexion Right;10 seconds    Knee: Self-Stretch Limitations 10 reps onto 12" step      Knee/Hip Exercises: Aerobic   Stationary Bike full rev 4' seat 19      Knee/Hip Exercises: Standing   Heel Raises Both;15 reps    Knee Flexion Right;15 reps    Terminal Knee Extension Right;10 reps;Theraband    Theraband Level (Terminal Knee Extension) Level 2 (Red)    Terminal Knee Extension Limitations 5"    Gait Training 200 with SPC      Knee/Hip Exercises: Seated   Long Arc Quad Right;10 reps    Long Arc Quad Limitations 5" holds      Knee/Hip Exercises: Supine   Bridges 10 reps;Both    Straight Leg Raises AROM;10 reps    Straight Leg Raises Limitations cueing for quad set prior raise    Knee Extension AROM    Knee Extension Limitations 2    Knee Flexion AROM    Knee Flexion Limitations 108    Other Supine Knee/Hip Exercises PROM 5x for flexion                        PT Short Term Goals - 11/24/21 1505       PT SHORT TERM GOAL #1   Title Patient will be independent with initial HEP and self-management strategies to improve functional outcomes    Time 3    Period Weeks    Status New    Target Date 12/15/21      PT SHORT TERM GOAL #2   Title Patient will have RT knee AROM 5-100 degrees to improve functional mobility and facilitate squatting to pick up items from floor.    Time 3    Period Weeks    Status New    Target Date 12/15/21               PT Long Term Goals - 11/24/21 1506       PT LONG TERM GOAL #1   Title Patient will have RT knee AROM 0-120 degrees to improve functional mobility and facilitate squatting to pick up items from floor.    Time 6    Period Weeks    Status New    Target Date 01/05/22      PT LONG TERM GOAL #2   Title Patient will improve FOTO score to predicted value to indicate improvement in functional outcomes    Time 6    Period Weeks    Status New    Target Date 01/05/22      PT LONG TERM GOAL #3   Title Patient will report at least 75% overall improvement in subjective complaint to indicate improvement in ability to perform ADLs.    Time 6    Period Weeks    Status New    Target Date 01/05/22      PT LONG TERM GOAL #4   Title Patient will have equal to or > 4+/5 MMT throughout RLE to improve ability to perform functional mobility, stair ambulation and ADLs.    Time 6    Period Weeks    Status New    Target Date 01/05/22      PT LONG TERM GOAL #5   Title Patient will be able to ambulate at least 375 feet during with LRAD to demonstrate  improved ability to perform functional mobility and associated tasks.    Time 6    Period Weeks    Status New    Target Date 01/05/22                   Plan - 12/08/21 1436     Clinical Impression Statement Session focus with knee mobility and strengthening.  Added TKE, miniwall squats and bridges for quad and gluteal  strengthening.  Pt motivated and progressing well with current POC.  Added PROM that was tolerated well with improved AROM 2-108.  Good mechanics and increased cadence wiht SPC, no LOB episodes.  Strength improving with no assistance required with SLR, cueing for quad set prior raise to address extension lag.    Personal Factors and Comorbidities Comorbidity 3+    Examination-Activity Limitations Transfers;Hygiene/Grooming;Dressing;Stand;Carry;Stairs;Squat;Bend;Sleep;Bed Mobility;Locomotion Level;Lift    Examination-Participation Restrictions Cleaning;Community Activity;Driving;Yard Work;Laundry;Shop    Stability/Clinical Decision Making Stable/Uncomplicated    Clinical Decision Making Low    Rehab Potential Good    PT Frequency 3x / week    PT Duration 6 weeks    PT Treatment/Interventions ADLs/Self Care Home Management;Biofeedback;Cryotherapy;Stair training;Gait training;Orthotic Fit/Training;Patient/family education;DME Instruction;Functional mobility training;Therapeutic activities;Iontophoresis 4mg /ml Dexamethasone;Therapeutic exercise;Moist Heat;Electrical Stimulation;Traction;Manual lymph drainage;Balance training;Manual techniques;Vasopneumatic Device;Taping;Energy conservation;Splinting;Dry needling;Neuromuscular re-education;Parrafin;Fluidtherapy;Contrast Bath;Ultrasound;Compression bandaging;Visual/perceptual remediation/compensation;Scar mobilization;Passive range of motion;Spinal Manipulations;Joint Manipulations    PT Next Visit Plan Try quad set if able to tolerate prone.  ROM based exercises and progress glute and knee strength and mobility as tolerated. Manual edema and STM as needed for pain and restriction.    PT Home Exercise Plan HEP Review: heel prop, glute set, heel slide with strap, calf stretch with strap    Consulted and Agree with Plan of Care Patient             Patient will benefit from skilled therapeutic intervention in order to improve the following deficits and  impairments:  Abnormal gait, Decreased endurance, Hypomobility, Increased edema, Decreased activity tolerance, Decreased strength, Increased fascial restricitons, Pain, Decreased balance, Decreased mobility, Difficulty walking, Impaired flexibility, Decreased range of motion, Improper body mechanics  Visit Diagnosis: Right knee pain, unspecified chronicity  Stiffness of right knee, not elsewhere classified  Other abnormalities of gait and mobility  Difficulty in walking, not elsewhere classified     Problem List 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   Unilateral primary osteoarthritis, right knee    Medial meniscus, posterior horn derangement, right    De Quervain's syndrome (tenosynovitis)    DIABETES MELLITUS, UNCONTROLLED 07/22/2009   ANKLE, ARTHRITIS, DEGEN./OSTEO 08/21/2008   ADJUSTMENT DISORDER WITH DEPRESSED MOOD 08/13/2008   ALLERGIC RHINITIS 05/15/2008   LEG PAIN, CHRONIC 10/05/2007   LEUKOCYTOSIS 04/05/2007   SORE THROAT 04/05/2007   SARCOIDOSIS 01/29/2007   OBESITY, MORBID 01/29/2007   OBSTRUCTIVE SLEEP APNEA 01/29/2007   MIGRAINE HEADACHE 01/29/2007   HYPERTENSION 01/29/2007   OSTEOARTHRITIS 01/29/2007   03/30/2007, LPTA/CLT; CBIS [phone]  [phone], PTA 12/08/2021, 2:52 PM  [facility] [address] [place], [place], [place] Phone: [phone]   Fax:  [phone]  Name: MIYOSHI LIGAS MRN: [name] Date of Birth: 01-29-1966

## 2021-12-10 ENCOUNTER — Ambulatory Visit (HOSPITAL_COMMUNITY): Payer: Medicare Other | Admitting: Physical Therapy

## 2021-12-10 ENCOUNTER — Other Ambulatory Visit: Payer: Self-pay

## 2021-12-10 DIAGNOSIS — R2689 Other abnormalities of gait and mobility: Secondary | ICD-10-CM

## 2021-12-10 DIAGNOSIS — M25661 Stiffness of right knee, not elsewhere classified: Secondary | ICD-10-CM

## 2021-12-10 DIAGNOSIS — R262 Difficulty in walking, not elsewhere classified: Secondary | ICD-10-CM

## 2021-12-10 DIAGNOSIS — M25561 Pain in right knee: Secondary | ICD-10-CM | POA: Diagnosis not present

## 2021-12-10 NOTE — Therapy (Signed)
[facility] [address] [place], [place], [address] Phone: [phone]   Fax:  [phone]  Physical Therapy Treatment  Patient Details  Name: Terri Logan MRN: [id number] Date of Birth: 1966-02-05 Referring Provider (PT): Barnie Del NP   Encounter Date: 12/10/2021   PT End of Session - 12/10/21 1216     Visit Number 6    Number of Visits 18    Date for PT Re-Evaluation 01/05/22    Authorization Type Medicare A/ Medicaid 2nd    Progress Note Due on Visit 10    PT Start Time 1135    PT Stop Time 1214    PT Time Calculation (min) 39 min    Activity Tolerance Patient tolerated treatment well;Patient limited by pain    Behavior During Therapy [facility] for tasks assessed/performed             Past Medical History:  Diagnosis Date   Arthritis    Diabetes mellitus without complication (HCC)    GERD (gastroesophageal reflux disease)    Hypertension    Sarcoidosis    Sleep apnea     Past Surgical History:  Procedure Laterality Date   CHOLECYSTECTOMY     15 years ago [facility]   DORSAL COMPARTMENT RELEASE Left 07/10/2015   Procedure: RELEASE DORSAL COMPARTMENT (DEQUERVAIN);  Surgeon: Vickki Hearing, MD;  Location: [facility];  Service: Orthopedics;  Laterality: Left;   KNEE ARTHROSCOPY WITH MEDIAL MENISECTOMY Right 10/20/2016   Procedure: KNEE ARTHROSCOPY WITH MEDIAL MENISECTOMY;  Surgeon: Vickki Hearing, MD;  Location: [facility];  Service: Orthopedics;  Laterality: Right;   ORIF ANKLE FRACTURE Right    TOTAL KNEE ARTHROPLASTY Right 11/05/2021   Procedure: RIGHT TOTAL KNEE ARTHROPLASTY;  Surgeon: Nadara Mustard, MD;  Location: [facility] OR;  Service: Orthopedics;  Laterality: Right;    There were no vitals filed for this visit.   Subjective Assessment - 12/10/21 1141     Subjective Pt states she did too much walking around the house without AD and stretching yesterday and now in increased pain.  Currently 5/10 after taking 2 pain  pills.  States she has not hurt this bad in a while.    Currently in Pain? Yes    Pain Score 5     Pain Location Knee    Pain Orientation Right    Pain Descriptors / Indicators Aching;Throbbing;Spasm                               [facility] Adult PT Treatment/Exercise - 12/10/21 0001       Knee/Hip Exercises: Stretches   Active Hamstring Stretch Right;3 reps;30 seconds    Active Hamstring Stretch Limitations standing 12" box    Knee: Self-Stretch to increase Flexion Right;10 seconds    Knee: Self-Stretch Limitations 10 reps onto 12" step    Gastroc Stretch Both;3 reps;30 seconds    Gastroc Stretch Limitations slant board      Knee/Hip Exercises: Standing   Heel Raises Both;15 reps    Knee Flexion Right;15 reps    Terminal Knee Extension Right;Theraband;15 reps    Theraband Level (Terminal Knee Extension) Level 3 (Green)    Terminal Knee Extension Limitations 5"      Knee/Hip Exercises: Seated   Long Arc Quad Right;15 reps    Long Arc Quad Limitations 5" holds    Other Seated Knee/Hip Exercises heelslides 10X with towel  Knee/Hip Exercises: Supine   Quad Sets 15 reps    Heel Slides Right;15 reps    Knee Extension AROM    Knee Extension Limitations 2    Knee Flexion AROM    Knee Flexion Limitations 110      Manual Therapy   Manual Therapy Soft tissue mobilization    Manual therapy comments Manual complete separate than rest of tx    Soft tissue mobilization to reduce scar tissue and adhesions                       PT Short Term Goals - 11/24/21 1505       PT SHORT TERM GOAL #1   Title Patient will be independent with initial HEP and self-management strategies to improve functional outcomes    Time 3    Period Weeks    Status New    Target Date 12/15/21      PT SHORT TERM GOAL #2   Title Patient will have RT knee AROM 5-100 degrees to improve functional mobility and facilitate squatting to pick up items from floor.    Time 3     Period Weeks    Status New    Target Date 12/15/21               PT Long Term Goals - 11/24/21 1506       PT LONG TERM GOAL #1   Title Patient will have RT knee AROM 0-120 degrees to improve functional mobility and facilitate squatting to pick up items from floor.    Time 6    Period Weeks    Status New    Target Date 01/05/22      PT LONG TERM GOAL #2   Title Patient will improve FOTO score to predicted value to indicate improvement in functional outcomes    Time 6    Period Weeks    Status New    Target Date 01/05/22      PT LONG TERM GOAL #3   Title Patient will report at least 75% overall improvement in subjective complaint to indicate improvement in ability to perform ADLs.    Time 6    Period Weeks    Status New    Target Date 01/05/22      PT LONG TERM GOAL #4   Title Patient will have equal to or > 4+/5 MMT throughout RLE to improve ability to perform functional mobility, stair ambulation and ADLs.    Time 6    Period Weeks    Status New    Target Date 01/05/22      PT LONG TERM GOAL #5   Title Patient will be able to ambulate at least 375 feet during with LRAD to demonstrate improved ability to perform functional mobility and associated tasks.    Time 6    Period Weeks    Status New    Target Date 01/05/22                   Plan - 12/10/21 1215     Clinical Impression Statement Continued with focus on improving Rt knee ROM and functional mobility.  Pt with increased pain this session from increased walking yesterday.  Focused more on ROM today due to increased discomfort with WBing.  Encouraged to wear thigh highs everyday as reports she has not been wearing them when she is coming for therapy.  Explained this is most important to wear  when exercising. Manual completed to help reduce scar tissue; able to get several pops in upper proximal scar.  Increased flexion to 110 following.    Personal Factors and Comorbidities Comorbidity 3+     Examination-Activity Limitations Transfers;Hygiene/Grooming;Dressing;Stand;Carry;Stairs;Squat;Bend;Sleep;Bed Mobility;Locomotion Level;Lift    Examination-Participation Restrictions Cleaning;Community Activity;Driving;Yard Work;Laundry;Shop    Stability/Clinical Decision Making Stable/Uncomplicated    Rehab Potential Good    PT Frequency 3x / week    PT Duration 6 weeks    PT Treatment/Interventions ADLs/Self Care Home Management;Biofeedback;Cryotherapy;Stair training;Gait training;Orthotic Fit/Training;Patient/family education;DME Instruction;Functional mobility training;Therapeutic activities;Iontophoresis 4mg /ml Dexamethasone;Therapeutic exercise;Moist Heat;Electrical Stimulation;Traction;Manual lymph drainage;Balance training;Manual techniques;Vasopneumatic Device;Taping;Energy conservation;Splinting;Dry needling;Neuromuscular re-education;Parrafin;Fluidtherapy;Contrast Bath;Ultrasound;Compression bandaging;Visual/perceptual remediation/compensation;Scar mobilization;Passive range of motion;Spinal Manipulations;Joint Manipulations    PT Next Visit Plan ROM based exercises and progress glute and knee strength and mobility as tolerated. Manual edema and STM as needed for pain and restriction.    PT Home Exercise Plan HEP Review: heel prop, glute set, heel slide with strap, calf stretch with strap    Consulted and Agree with Plan of Care Patient             Patient will benefit from skilled therapeutic intervention in order to improve the following deficits and impairments:  Abnormal gait, Decreased endurance, Hypomobility, Increased edema, Decreased activity tolerance, Decreased strength, Increased fascial restricitons, Pain, Decreased balance, Decreased mobility, Difficulty walking, Impaired flexibility, Decreased range of motion, Improper body mechanics  Visit Diagnosis: Right knee pain, unspecified chronicity  Stiffness of right knee, not elsewhere classified  Other abnormalities of gait  and mobility  Difficulty in walking, not elsewhere classified     Problem List 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   Unilateral primary osteoarthritis, right knee    Medial meniscus, posterior horn derangement, right    De Quervain's syndrome (tenosynovitis)    DIABETES MELLITUS, UNCONTROLLED 07/22/2009   ANKLE, ARTHRITIS, DEGEN./OSTEO 08/21/2008   ADJUSTMENT DISORDER WITH DEPRESSED MOOD 08/13/2008   ALLERGIC RHINITIS 05/15/2008   LEG PAIN, CHRONIC 10/05/2007   LEUKOCYTOSIS 04/05/2007   SORE THROAT 04/05/2007   SARCOIDOSIS 01/29/2007   OBESITY, MORBID 01/29/2007   OBSTRUCTIVE SLEEP APNEA 01/29/2007   MIGRAINE HEADACHE 01/29/2007   HYPERTENSION 01/29/2007   OSTEOARTHRITIS 01/29/2007   Zaccai Chavarin 03/30/2007, PTA/CLT, WTA [phone]  [phone], PTA 12/10/2021, 12:17 PM  [facility] [address] [place], [place], [place] Phone: [phone]   Fax:  [phone]  Name: Terri Logan MRN: [name] Date of Birth: June 14, 1966

## 2021-12-13 ENCOUNTER — Encounter (HOSPITAL_COMMUNITY): Payer: Self-pay

## 2021-12-13 ENCOUNTER — Other Ambulatory Visit: Payer: Self-pay

## 2021-12-13 ENCOUNTER — Ambulatory Visit (HOSPITAL_COMMUNITY): Payer: Medicare Other | Admitting: Physical Therapy

## 2021-12-15 ENCOUNTER — Ambulatory Visit (HOSPITAL_COMMUNITY): Payer: Medicare Other | Admitting: Physical Therapy

## 2021-12-15 ENCOUNTER — Other Ambulatory Visit: Payer: Self-pay

## 2021-12-15 ENCOUNTER — Encounter (HOSPITAL_COMMUNITY): Payer: Self-pay | Admitting: Physical Therapy

## 2021-12-15 DIAGNOSIS — M25661 Stiffness of right knee, not elsewhere classified: Secondary | ICD-10-CM

## 2021-12-15 DIAGNOSIS — M25561 Pain in right knee: Secondary | ICD-10-CM | POA: Diagnosis not present

## 2021-12-15 DIAGNOSIS — R262 Difficulty in walking, not elsewhere classified: Secondary | ICD-10-CM

## 2021-12-15 DIAGNOSIS — R2689 Other abnormalities of gait and mobility: Secondary | ICD-10-CM

## 2021-12-15 NOTE — Therapy (Signed)
[facility] [address] [place], [place], [address] Phone: [phone]   Fax:  [phone]  Physical Therapy Treatment  Patient Details  Name: Terri Logan MRN: [id number] Date of Birth: [birth year] Referring Provider (PT): Barnie Del NP   Encounter Date: 12/15/2021   PT End of Session - 12/15/21 1531     Visit Number 7    Number of Visits 18    Date for PT Re-Evaluation 01/05/22    Authorization Type Medicare A/ Medicaid 2nd    Progress Note Due on Visit 10    PT Start Time 1525    PT Stop Time 1610    PT Time Calculation (min) 45 min    Activity Tolerance Patient tolerated treatment well;Patient limited by pain    Behavior During Therapy [facility] for tasks assessed/performed             Past Medical History:  Diagnosis Date   Arthritis    Diabetes mellitus without complication (HCC)    GERD (gastroesophageal reflux disease)    Hypertension    Sarcoidosis    Sleep apnea     Past Surgical History:  Procedure Laterality Date   CHOLECYSTECTOMY     15 years ago [facility]   DORSAL COMPARTMENT RELEASE Left 07/10/2015   Procedure: RELEASE DORSAL COMPARTMENT (DEQUERVAIN);  Surgeon: Vickki Hearing, MD;  Location: [facility];  Service: Orthopedics;  Laterality: Left;   KNEE ARTHROSCOPY WITH MEDIAL MENISECTOMY Right 10/20/2016   Procedure: KNEE ARTHROSCOPY WITH MEDIAL MENISECTOMY;  Surgeon: Vickki Hearing, MD;  Location: [facility];  Service: Orthopedics;  Laterality: Right;   ORIF ANKLE FRACTURE Right    TOTAL KNEE ARTHROPLASTY Right 11/05/2021   Procedure: RIGHT TOTAL KNEE ARTHROPLASTY;  Surgeon: Nadara Mustard, MD;  Location: [facility] OR;  Service: Orthopedics;  Laterality: Right;    There were no vitals filed for this visit.   Subjective Assessment - 12/15/21 1530     Subjective Doing well overall. LT knee hurting more than RT. Exercise going well. Walking more with cane at home.    Pertinent History RT TKA 11/05/21     Limitations Lifting;Standing;Walking;House hold activities    Patient Stated Goals Get back full ROM, weight loss, get active    Currently in Pain? Yes    Pain Score 1     Pain Location Knee    Pain Orientation Right    Pain Descriptors / Indicators Aching    Pain Type Surgical pain                               [facility] Adult PT Treatment/Exercise - 12/15/21 0001       Knee/Hip Exercises: Stretches   Gastroc Stretch Both;3 reps;30 seconds    Gastroc Stretch Limitations slant board      Knee/Hip Exercises: Standing   Heel Raises Both;15 reps    Gait Training 200 with large base quad cane    Other Standing Knee Exercises tandem stance 2 x 30"      Knee/Hip Exercises: Seated   Sit to [employer] 10 reps      Knee/Hip Exercises: Supine   Quad Sets Right;15 reps    Quad Sets Limitations 5" hold    Heel Slides Right;15 reps;AAROM    Bridges 15 reps    Straight Leg Raises Right;15 reps    Knee Extension AROM    Knee Extension Limitations 3    Knee  Flexion AROM    Knee Flexion Limitations 100                       PT Short Term Goals - 11/24/21 1505       PT SHORT TERM GOAL #1   Title Patient will be independent with initial HEP and self-management strategies to improve functional outcomes    Time 3    Period Weeks    Status New    Target Date 12/15/21      PT SHORT TERM GOAL #2   Title Patient will have RT knee AROM 5-100 degrees to improve functional mobility and facilitate squatting to pick up items from floor.    Time 3    Period Weeks    Status New    Target Date 12/15/21               PT Long Term Goals - 11/24/21 1506       PT LONG TERM GOAL #1   Title Patient will have RT knee AROM 0-120 degrees to improve functional mobility and facilitate squatting to pick up items from floor.    Time 6    Period Weeks    Status New    Target Date 01/05/22      PT LONG TERM GOAL #2   Title Patient will improve FOTO score to predicted  value to indicate improvement in functional outcomes    Time 6    Period Weeks    Status New    Target Date 01/05/22      PT LONG TERM GOAL #3   Title Patient will report at least 75% overall improvement in subjective complaint to indicate improvement in ability to perform ADLs.    Time 6    Period Weeks    Status New    Target Date 01/05/22      PT LONG TERM GOAL #4   Title Patient will have equal to or > 4+/5 MMT throughout RLE to improve ability to perform functional mobility, stair ambulation and ADLs.    Time 6    Period Weeks    Status New    Target Date 01/05/22      PT LONG TERM GOAL #5   Title Patient will be able to ambulate at least 375 feet during with LRAD to demonstrate improved ability to perform functional mobility and associated tasks.    Time 6    Period Weeks    Status New    Target Date 01/05/22                   Plan - 12/15/21 1618     Clinical Impression Statement Patient showing good functional progress. More limited in knee AROM today. Tolerated additional strengthening exercise well. Patient challenged with tandem stance with max hold about 10 seconds, and requiring intermittent HHA. Patient cued on proper sequencing gait training using large based quad cane. Patient demos good stability otherwise. Issued updated HEP. Patient will continue to benefit from skilled therapy services to reduce deficits and improve function.    Personal Factors and Comorbidities Comorbidity 3+    Examination-Activity Limitations Transfers;Hygiene/Grooming;Dressing;Stand;Carry;Stairs;Squat;Bend;Sleep;Bed Mobility;Locomotion Level;Lift    Examination-Participation Restrictions Cleaning;Community Activity;Driving;Yard Work;Laundry;Shop    Stability/Clinical Decision Making Stable/Uncomplicated    Rehab Potential Good    PT Frequency 3x / week    PT Duration 6 weeks    PT Treatment/Interventions ADLs/Self Care Home Management;Biofeedback;Cryotherapy;Stair  training;Gait training;Orthotic Fit/Training;Patient/family education;DME Instruction;Functional mobility  training;Therapeutic activities;Iontophoresis 4mg /ml Dexamethasone;Therapeutic exercise;Moist Heat;Electrical Stimulation;Traction;Manual lymph drainage;Balance training;Manual techniques;Vasopneumatic Device;Taping;Energy conservation;Splinting;Dry needling;Neuromuscular re-education;Parrafin;Fluidtherapy;Contrast Bath;Ultrasound;Compression bandaging;Visual/perceptual remediation/compensation;Scar mobilization;Passive range of motion;Spinal Manipulations;Joint Manipulations    PT Next Visit Plan ROM based exercises and progress glute and knee strength and mobility as tolerated. Manual edema and STM as needed for pain and restriction.    PT Home Exercise Plan HEP Review: heel prop, glute set, heel slide with strap, calf stretch with strap 12/21 heel raise, STS, tandem stance, seated knee flexion    Consulted and Agree with Plan of Care Patient             Patient will benefit from skilled therapeutic intervention in order to improve the following deficits and impairments:  Abnormal gait, Decreased endurance, Hypomobility, Increased edema, Decreased activity tolerance, Decreased strength, Increased fascial restricitons, Pain, Decreased balance, Decreased mobility, Difficulty walking, Impaired flexibility, Decreased range of motion, Improper body mechanics  Visit Diagnosis: Right knee pain, unspecified chronicity  Stiffness of right knee, not elsewhere classified  Other abnormalities of gait and mobility  Difficulty in walking, not elsewhere classified     Problem List 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   Unilateral primary osteoarthritis, right knee    Medial meniscus, posterior horn derangement, right    De Quervain's syndrome (tenosynovitis)    DIABETES MELLITUS, UNCONTROLLED 07/22/2009   ANKLE, ARTHRITIS,  DEGEN./OSTEO 08/21/2008   ADJUSTMENT DISORDER WITH DEPRESSED MOOD 08/13/2008   ALLERGIC RHINITIS 05/15/2008   LEG PAIN, CHRONIC 10/05/2007   LEUKOCYTOSIS 04/05/2007   SORE THROAT 04/05/2007   SARCOIDOSIS 01/29/2007   OBESITY, MORBID 01/29/2007   OBSTRUCTIVE SLEEP APNEA 01/29/2007   MIGRAINE HEADACHE 01/29/2007   HYPERTENSION 01/29/2007   OSTEOARTHRITIS 01/29/2007   4:20 PM, 12/15/21 12/17/21 PT DPT  Physical Therapist with [facility]  [facility]  [phone]  [facility] [address] [place], [place], [place] Phone: [phone]   Fax:  [phone]  Name: Terri Logan MRN: [name] Date of Birth: 09-16-1966

## 2021-12-15 NOTE — Patient Instructions (Signed)
Access Code: [email or web address] URL: [email or web address] Date: 12/15/2021 Prepared by: Georges Lynch  Exercises Seated Knee Flexion AAROM - 2-3 x daily - 7 x weekly - 1 sets - 10 reps - 10 second hold Standing Heel Raise with Support - 2-3 x daily - 7 x weekly - 2 sets - 10 reps Sit to Stand - 2-3 x daily - 7 x weekly - 2 sets - 10 reps Standing Tandem Balance with Counter Support - 2-3 x daily - 7 x weekly - 1 sets - 10 reps - 30 second hold

## 2021-12-17 ENCOUNTER — Encounter (HOSPITAL_COMMUNITY): Payer: Medicare Other

## 2021-12-21 ENCOUNTER — Other Ambulatory Visit: Payer: Self-pay

## 2021-12-21 ENCOUNTER — Ambulatory Visit (HOSPITAL_COMMUNITY): Payer: Medicare Other

## 2021-12-21 ENCOUNTER — Encounter: Payer: Medicare Other | Admitting: Family

## 2021-12-21 DIAGNOSIS — R262 Difficulty in walking, not elsewhere classified: Secondary | ICD-10-CM

## 2021-12-21 DIAGNOSIS — M25661 Stiffness of right knee, not elsewhere classified: Secondary | ICD-10-CM

## 2021-12-21 DIAGNOSIS — M25561 Pain in right knee: Secondary | ICD-10-CM

## 2021-12-21 DIAGNOSIS — R2689 Other abnormalities of gait and mobility: Secondary | ICD-10-CM

## 2021-12-21 NOTE — Therapy (Signed)
[facility] [address] [place], [place], [address] Phone: [phone]   Fax:  [phone]  Physical Therapy Treatment  Patient Details  Name: Terri Logan MRN: [id number] Date of Birth: [birth year] Referring Provider (PT): Barnie Del NP   Encounter Date: 12/21/2021   PT End of Session - 12/21/21 1641     Visit Number 8    Number of Visits 18    Date for PT Re-Evaluation 01/05/22    Authorization Type Medicare A/ Medicaid 2nd    Progress Note Due on Visit 10    PT Start Time 1645    PT Stop Time 1730    PT Time Calculation (min) 45 min    Activity Tolerance Patient tolerated treatment well;Patient limited by pain    Behavior During Therapy [facility] for tasks assessed/performed             Past Medical History:  Diagnosis Date   Arthritis    Diabetes mellitus without complication (HCC)    GERD (gastroesophageal reflux disease)    Hypertension    Sarcoidosis    Sleep apnea     Past Surgical History:  Procedure Laterality Date   CHOLECYSTECTOMY     15 years ago [facility]   DORSAL COMPARTMENT RELEASE Left 07/10/2015   Procedure: RELEASE DORSAL COMPARTMENT (DEQUERVAIN);  Surgeon: Vickki Hearing, MD;  Location: [facility];  Service: Orthopedics;  Laterality: Left;   KNEE ARTHROSCOPY WITH MEDIAL MENISECTOMY Right 10/20/2016   Procedure: KNEE ARTHROSCOPY WITH MEDIAL MENISECTOMY;  Surgeon: Vickki Hearing, MD;  Location: [facility];  Service: Orthopedics;  Laterality: Right;   ORIF ANKLE FRACTURE Right    TOTAL KNEE ARTHROPLASTY Right 11/05/2021   Procedure: RIGHT TOTAL KNEE ARTHROPLASTY;  Surgeon: Nadara Mustard, MD;  Location: [facility] OR;  Service: Orthopedics;  Laterality: Right;    There were no vitals filed for this visit.   Subjective Assessment - 12/21/21 1648     Subjective Doing well, walking as much as I can without the walker. A lot better    Pertinent History RT TKA 11/05/21    Limitations Lifting;Standing;Walking;House  hold activities    Patient Stated Goals Get back full ROM, weight loss, get active    Currently in Pain? Yes    Pain Score 2     Pain Location Knee    Pain Orientation Right    Pain Descriptors / Indicators Aching    Pain Type Surgical pain                [facility] PT Assessment - 12/21/21 0001       Assessment   Medical Diagnosis RT TKA    Referring Provider (PT) Barnie Del NP    Onset Date/Surgical Date 11/05/21    Next MD Visit 12/21/21    Prior Therapy no                           [facility] Adult PT Treatment/Exercise - 12/21/21 0001       Knee/Hip Exercises: Stretches   Quad Stretch Right;4 reps;60 seconds    Knee: Self-Stretch to increase Flexion Right;10 seconds    Knee: Self-Stretch Limitations 10 reps onto 12" step      Knee/Hip Exercises: Aerobic   Stationary Bike full rev 4' seat 18      Knee/Hip Exercises: Standing   Heel Raises Both;2 sets;10 reps    Knee Flexion Strengthening;Right;2 sets;10 reps    Knee  Flexion Limitations 3#    Other Standing Knee Exercises sidestepping x 2 min. stair taps x2 min with 3 lbs                       PT Short Term Goals - 12/21/21 1731       PT SHORT TERM GOAL #1   Title Patient will be independent with initial HEP and self-management strategies to improve functional outcomes    Time 3    Period Weeks    Status On-going    Target Date 12/15/21      PT SHORT TERM GOAL #2   Title Patient will have RT knee AROM 5-100 degrees to improve functional mobility and facilitate squatting to pick up items from floor.    Baseline 0-103    Time 3    Period Weeks    Status Achieved    Target Date 12/15/21               PT Long Term Goals - 11/24/21 1506       PT LONG TERM GOAL #1   Title Patient will have RT knee AROM 0-120 degrees to improve functional mobility and facilitate squatting to pick up items from floor.    Time 6    Period Weeks    Status New    Target Date 01/05/22       PT LONG TERM GOAL #2   Title Patient will improve FOTO score to predicted value to indicate improvement in functional outcomes    Time 6    Period Weeks    Status New    Target Date 01/05/22      PT LONG TERM GOAL #3   Title Patient will report at least 75% overall improvement in subjective complaint to indicate improvement in ability to perform ADLs.    Time 6    Period Weeks    Status New    Target Date 01/05/22      PT LONG TERM GOAL #4   Title Patient will have equal to or > 4+/5 MMT throughout RLE to improve ability to perform functional mobility, stair ambulation and ADLs.    Time 6    Period Weeks    Status New    Target Date 01/05/22      PT LONG TERM GOAL #5   Title Patient will be able to ambulate at least 375 feet during with LRAD to demonstrate improved ability to perform functional mobility and associated tasks.    Time 6    Period Weeks    Status New    Target Date 01/05/22                   Plan - 12/21/21 1728     Clinical Impression Statement Progressing very well with POC details and demonstrating improved RLE strength and ROM and able to tolerate increased resistance for PRE.  Initiated prone quad stretch today with good effect and progressing with transfers and ambulation with decreased antalgic pattern evident and decreased dependence on upper body support of AD and pt notes increased walking at home without AD. Continued sessions indicated to progress Right knee ROM and strength to facilitate normalized gait pattern    Personal Factors and Comorbidities Comorbidity 3+    Examination-Activity Limitations Transfers;Hygiene/Grooming;Dressing;Stand;Carry;Stairs;Squat;Bend;Sleep;Bed Mobility;Locomotion Level;Lift    Examination-Participation Restrictions Cleaning;Community Activity;Driving;Yard Work;Laundry;Shop    Stability/Clinical Decision Making Stable/Uncomplicated    Rehab Potential Good    PT Frequency 3x /  week    PT Duration 6 weeks     PT Treatment/Interventions ADLs/Self Care Home Management;Biofeedback;Cryotherapy;Stair training;Gait training;Orthotic Fit/Training;Patient/family education;DME Instruction;Functional mobility training;Therapeutic activities;Iontophoresis 4mg /ml Dexamethasone;Therapeutic exercise;Moist Heat;Electrical Stimulation;Traction;Manual lymph drainage;Balance training;Manual techniques;Vasopneumatic Device;Taping;Energy conservation;Splinting;Dry needling;Neuromuscular re-education;Parrafin;Fluidtherapy;Contrast Bath;Ultrasound;Compression bandaging;Visual/perceptual remediation/compensation;Scar mobilization;Passive range of motion;Spinal Manipulations;Joint Manipulations    PT Next Visit Plan ROM based exercises and progress glute and knee strength and mobility as tolerated. Manual edema and STM as needed for pain and restriction.    PT Home Exercise Plan HEP Review: heel prop, glute set, heel slide with strap, calf stretch with strap 12/21 heel raise, STS, tandem stance, seated knee flexion    Consulted and Agree with Plan of Care Patient             Patient will benefit from skilled therapeutic intervention in order to improve the following deficits and impairments:  Abnormal gait, Decreased endurance, Hypomobility, Increased edema, Decreased activity tolerance, Decreased strength, Increased fascial restricitons, Pain, Decreased balance, Decreased mobility, Difficulty walking, Impaired flexibility, Decreased range of motion, Improper body mechanics  Visit Diagnosis: Right knee pain, unspecified chronicity  Stiffness of right knee, not elsewhere classified  Other abnormalities of gait and mobility  Difficulty in walking, not elsewhere classified     Problem List 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   Unilateral primary osteoarthritis, right knee    Medial meniscus, posterior horn derangement, right    De Quervain's syndrome  (tenosynovitis)    DIABETES MELLITUS, UNCONTROLLED 07/22/2009   ANKLE, ARTHRITIS, DEGEN./OSTEO 08/21/2008   ADJUSTMENT DISORDER WITH DEPRESSED MOOD 08/13/2008   ALLERGIC RHINITIS 05/15/2008   LEG PAIN, CHRONIC 10/05/2007   LEUKOCYTOSIS 04/05/2007   SORE THROAT 04/05/2007   SARCOIDOSIS 01/29/2007   OBESITY, MORBID 01/29/2007   OBSTRUCTIVE SLEEP APNEA 01/29/2007   MIGRAINE HEADACHE 01/29/2007   HYPERTENSION 01/29/2007   OSTEOARTHRITIS 01/29/2007    03/30/2007, PT 12/21/2021, 5:32 PM  [facility] [address] [place], [place], [place] Phone: [phone]   Fax:  [phone]  Name: Terri Logan MRN: [name] Date of Birth: 13-Mar-1966

## 2021-12-22 ENCOUNTER — Ambulatory Visit (INDEPENDENT_AMBULATORY_CARE_PROVIDER_SITE_OTHER): Payer: Medicare Other | Admitting: Family

## 2021-12-22 ENCOUNTER — Ambulatory Visit (INDEPENDENT_AMBULATORY_CARE_PROVIDER_SITE_OTHER): Payer: Medicare Other

## 2021-12-22 ENCOUNTER — Encounter: Payer: Self-pay | Admitting: Family

## 2021-12-22 ENCOUNTER — Ambulatory Visit (HOSPITAL_COMMUNITY): Payer: Medicare Other | Admitting: Physical Therapy

## 2021-12-22 DIAGNOSIS — M25661 Stiffness of right knee, not elsewhere classified: Secondary | ICD-10-CM

## 2021-12-22 DIAGNOSIS — Z96651 Presence of right artificial knee joint: Secondary | ICD-10-CM

## 2021-12-22 DIAGNOSIS — M25561 Pain in right knee: Secondary | ICD-10-CM | POA: Diagnosis not present

## 2021-12-22 DIAGNOSIS — R2689 Other abnormalities of gait and mobility: Secondary | ICD-10-CM

## 2021-12-22 DIAGNOSIS — R262 Difficulty in walking, not elsewhere classified: Secondary | ICD-10-CM

## 2021-12-22 NOTE — Therapy (Signed)
[facility] [address] [place], [place], [address] Phone: [phone]   Fax:  [phone]  Physical Therapy Treatment  Patient Details  Name: Terri Logan MRN: [id number] Date of Birth: 12/18/1966 Referring Provider (PT): Barnie Del NP   Encounter Date: 12/22/2021   PT End of Session - 12/22/21 1333     Visit Number 9    Number of Visits 18    Date for PT Re-Evaluation 01/05/22    Authorization Type Medicare A/ Medicaid 2nd    Progress Note Due on Visit 10    PT Start Time 1320    PT Stop Time 1400    PT Time Calculation (min) 40 min    Activity Tolerance Patient tolerated treatment well;Patient limited by pain    Behavior During Therapy [facility] for tasks assessed/performed             Past Medical History:  Diagnosis Date   Arthritis    Diabetes mellitus without complication (HCC)    GERD (gastroesophageal reflux disease)    Hypertension    Sarcoidosis    Sleep apnea     Past Surgical History:  Procedure Laterality Date   CHOLECYSTECTOMY     15 years ago [facility]   DORSAL COMPARTMENT RELEASE Left 07/10/2015   Procedure: RELEASE DORSAL COMPARTMENT (DEQUERVAIN);  Surgeon: Vickki Hearing, MD;  Location: [facility];  Service: Orthopedics;  Laterality: Left;   KNEE ARTHROSCOPY WITH MEDIAL MENISECTOMY Right 10/20/2016   Procedure: KNEE ARTHROSCOPY WITH MEDIAL MENISECTOMY;  Surgeon: Vickki Hearing, MD;  Location: [facility];  Service: Orthopedics;  Laterality: Right;   ORIF ANKLE FRACTURE Right    TOTAL KNEE ARTHROPLASTY Right 11/05/2021   Procedure: RIGHT TOTAL KNEE ARTHROPLASTY;  Surgeon: Nadara Mustard, MD;  Location: [facility] OR;  Service: Orthopedics;  Laterality: Right;    There were no vitals filed for this visit.   Subjective Assessment - 12/22/21 1321     Subjective Just came back from the MD who has released her.  Her main problem is her LT knee at this time.  Steps are still difficult for her.    Pertinent History  RT TKA 11/05/21    Limitations Lifting;Standing;Walking;House hold activities    Patient Stated Goals Get back full ROM, weight loss, get active    Currently in Pain? Yes    Pain Score 2     Pain Location Knee    Pain Orientation Right    Pain Descriptors / Indicators Aching    Pain Type Surgical pain    Pain Onset More than a month ago    Pain Frequency Intermittent    Aggravating Factors  bending    Pain Relieving Factors ice , meds                               [facility] Adult PT Treatment/Exercise - 12/22/21 0001       Exercises   Exercises Knee/Hip      Knee/Hip Exercises: Stretches   Active Hamstring Stretch Right;3 reps;20 seconds    Knee: Self-Stretch to increase Flexion Right;3 reps;20 seconds    Gastroc Stretch Both;3 reps;30 seconds    Gastroc Stretch Limitations slant board      Knee/Hip Exercises: Aerobic   Stationary Bike full rev 4' seat 18      Knee/Hip Exercises: Standing   Heel Raises Both;15 reps    Knee Flexion Strengthening;15 reps  Knee Flexion Limitations 3#    Terminal Knee Extension Right;10 reps    Forward Step Up Right;Step Height: 4";15 reps    Functional Squat 10 reps    Other Standing Knee Exercises --      Knee/Hip Exercises: Seated   Sit to Sand 15 reps      Knee/Hip Exercises: Supine   Knee Extension AROM    Knee Extension Limitations 3    Knee Flexion Limitations 108      Knee/Hip Exercises: Prone   Hamstring Curl 10 reps    Contract/Relax to Increase Flexion 3                       PT Short Term Goals - 12/21/21 1731       PT SHORT TERM GOAL #1   Title Patient will be independent with initial HEP and self-management strategies to improve functional outcomes    Time 3    Period Weeks    Status On-going    Target Date 12/15/21      PT SHORT TERM GOAL #2   Title Patient will have RT knee AROM 5-100 degrees to improve functional mobility and facilitate squatting to pick up items from floor.     Baseline 0-103    Time 3    Period Weeks    Status Achieved    Target Date 12/15/21               PT Long Term Goals - 11/24/21 1506       PT LONG TERM GOAL #1   Title Patient will have RT knee AROM 0-120 degrees to improve functional mobility and facilitate squatting to pick up items from floor.    Time 6    Period Weeks    Status New    Target Date 01/05/22      PT LONG TERM GOAL #2   Title Patient will improve FOTO score to predicted value to indicate improvement in functional outcomes    Time 6    Period Weeks    Status New    Target Date 01/05/22      PT LONG TERM GOAL #3   Title Patient will report at least 75% overall improvement in subjective complaint to indicate improvement in ability to perform ADLs.    Time 6    Period Weeks    Status New    Target Date 01/05/22      PT LONG TERM GOAL #4   Title Patient will have equal to or > 4+/5 MMT throughout RLE to improve ability to perform functional mobility, stair ambulation and ADLs.    Time 6    Period Weeks    Status New    Target Date 01/05/22      PT LONG TERM GOAL #5   Title Patient will be able to ambulate at least 375 feet during with LRAD to demonstrate improved ability to perform functional mobility and associated tasks.    Time 6    Period Weeks    Status New    Target Date 01/05/22                   Plan - 12/22/21 1358     Clinical Impression Statement PT has been released from MD>  Pt states steps are the most difficult at this time therefore therapist began step ups on 4" step as well as beginning balance activity    Personal Factors and Comorbidities  Comorbidity 3+    Examination-Activity Limitations Transfers;Hygiene/Grooming;Dressing;Stand;Carry;Stairs;Squat;Bend;Sleep;Bed Mobility;Locomotion Level;Lift    Examination-Participation Restrictions Cleaning;Community Activity;Driving;Yard Work;Laundry;Shop    Stability/Clinical Decision Making Stable/Uncomplicated     Clinical Decision Making Low    Rehab Potential Good    PT Frequency 3x / week    PT Duration 6 weeks    PT Treatment/Interventions ADLs/Self Care Home Management;Biofeedback;Cryotherapy;Stair training;Gait training;Orthotic Fit/Training;Patient/family education;DME Instruction;Functional mobility training;Therapeutic activities;Iontophoresis 4mg /ml Dexamethasone;Therapeutic exercise;Moist Heat;Electrical Stimulation;Traction;Manual lymph drainage;Balance training;Manual techniques;Vasopneumatic Device;Taping;Energy conservation;Splinting;Dry needling;Neuromuscular re-education;Parrafin;Fluidtherapy;Contrast Bath;Ultrasound;Compression bandaging;Visual/perceptual remediation/compensation;Scar mobilization;Passive range of motion;Spinal Manipulations;Joint Manipulations    PT Next Visit Plan ROM based exercises and progress glute and knee strength and mobility as tolerated. Manual edema and STM as needed for pain and restriction.    PT Home Exercise Plan HEP Review: heel prop, glute set, heel slide with strap, calf stretch with strap 12/21 heel raise, STS, tandem stance, seated knee flexion             Patient will benefit from skilled therapeutic intervention in order to improve the following deficits and impairments:  Abnormal gait, Decreased endurance, Hypomobility, Increased edema, Decreased activity tolerance, Decreased strength, Increased fascial restricitons, Pain, Decreased balance, Decreased mobility, Difficulty walking, Impaired flexibility, Decreased range of motion, Improper body mechanics  Visit Diagnosis: Right knee pain, unspecified chronicity  Acute pain of right knee  Stiffness of right knee, not elsewhere classified  Other abnormalities of gait and mobility  Difficulty in walking, not elsewhere classified     Problem List 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   Unilateral primary osteoarthritis, right  knee    Medial meniscus, posterior horn derangement, right    De Quervain's syndrome (tenosynovitis)    DIABETES MELLITUS, UNCONTROLLED 07/22/2009   ANKLE, ARTHRITIS, DEGEN./OSTEO 08/21/2008   ADJUSTMENT DISORDER WITH DEPRESSED MOOD 08/13/2008   ALLERGIC RHINITIS 05/15/2008   LEG PAIN, CHRONIC 10/05/2007   LEUKOCYTOSIS 04/05/2007   SORE THROAT 04/05/2007   SARCOIDOSIS 01/29/2007   OBESITY, MORBID 01/29/2007   OBSTRUCTIVE SLEEP APNEA 01/29/2007   MIGRAINE HEADACHE 01/29/2007   HYPERTENSION 01/29/2007   OSTEOARTHRITIS 01/29/2007   03/30/2007, PT CLT [phone]  12/22/2021, 2:04 PM  [facility] [address] [place], [place], [place] Phone: [phone]   Fax:  [phone]  Name: Terri Logan MRN: [name] Date of Birth: [birth year]

## 2021-12-22 NOTE — Progress Notes (Signed)
Post-Op Visit Note   Patient: Terri Logan           Date of Birth: 01/08/1966           MRN: [id number] Visit Date: 12/22/2021 PCP: Benita Stabile, MD  Chief Complaint:  Chief Complaint  Patient presents with   Right Knee - Follow-up    S/p Right total knee replacement    HPI:  HPI Is a 56 year old woman seen status post right total knee arthroplasty.  Has been in outpatient physical therapy.  She feels she is doing very well she has been trying to ambulate in the home without a cane she does use 1 from time to time.  She has been aggressive with her physical therapy she has full extension and 110 degrees flexion  Ortho Exam On examination of the right knee the incision is well-healed there is no erythema very minimal edema fluid range of motion  Visit Diagnoses:  1. Total knee replacement status, right     Plan: She will complete her course of physical therapy.  Advance her activities as she tolerates and follow up in the office as needed  Follow-Up Instructions: No follow-ups on file.   Imaging: No results found.  Orders:  Orders Placed This Encounter  Procedures   XR Knee 1-2 Views Right    No orders of the defined types were placed in this encounter.    PMFS History: 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   Unilateral primary osteoarthritis, right knee    Medial meniscus, posterior horn derangement, right    De Quervain's syndrome (tenosynovitis)    DIABETES MELLITUS, UNCONTROLLED 07/22/2009   ANKLE, ARTHRITIS, DEGEN./OSTEO 08/21/2008   ADJUSTMENT DISORDER WITH DEPRESSED MOOD 08/13/2008   ALLERGIC RHINITIS 05/15/2008   LEG PAIN, CHRONIC 10/05/2007   LEUKOCYTOSIS 04/05/2007   SORE THROAT 04/05/2007   SARCOIDOSIS 01/29/2007   OBESITY, MORBID 01/29/2007   OBSTRUCTIVE SLEEP APNEA 01/29/2007   MIGRAINE HEADACHE 01/29/2007   HYPERTENSION 01/29/2007   OSTEOARTHRITIS 01/29/2007   Past  Medical History:  Diagnosis Date   Arthritis    Diabetes mellitus without complication (HCC)    GERD (gastroesophageal reflux disease)    Hypertension    Sarcoidosis    Sleep apnea     History reviewed. No pertinent family history.  Past Surgical History:  Procedure Laterality Date   CHOLECYSTECTOMY     15 years ago [facility]   DORSAL COMPARTMENT RELEASE Left 07/10/2015   Procedure: RELEASE DORSAL COMPARTMENT (DEQUERVAIN);  Surgeon: Vickki Hearing, MD;  Location: [facility];  Service: Orthopedics;  Laterality: Left;   KNEE ARTHROSCOPY WITH MEDIAL MENISECTOMY Right 10/20/2016   Procedure: KNEE ARTHROSCOPY WITH MEDIAL MENISECTOMY;  Surgeon: Vickki Hearing, MD;  Location: [facility];  Service: Orthopedics;  Laterality: Right;   ORIF ANKLE FRACTURE Right    TOTAL KNEE ARTHROPLASTY Right 11/05/2021   Procedure: RIGHT TOTAL KNEE ARTHROPLASTY;  Surgeon: Nadara Mustard, MD;  Location: [facility] OR;  Service: Orthopedics;  Laterality: Right;   Social History   Occupational History   Not on file  Tobacco Use   Smoking status: Former    Packs/day: 0.50    Years: 5.00    Pack years: 2.50    Types: Cigarettes    Quit date: 07/05/1994    Years since quitting: 27.4   Smokeless tobacco: Never  Substance and Sexual Activity   Alcohol use: No   Drug use: No  Sexual activity: Yes    Birth control/protection: Surgical

## 2021-12-24 ENCOUNTER — Encounter (HOSPITAL_COMMUNITY): Payer: Self-pay

## 2021-12-24 ENCOUNTER — Other Ambulatory Visit: Payer: Self-pay

## 2021-12-24 ENCOUNTER — Ambulatory Visit (HOSPITAL_COMMUNITY): Payer: Medicare Other

## 2021-12-24 DIAGNOSIS — M25561 Pain in right knee: Secondary | ICD-10-CM | POA: Diagnosis not present

## 2021-12-24 DIAGNOSIS — M25661 Stiffness of right knee, not elsewhere classified: Secondary | ICD-10-CM

## 2021-12-24 DIAGNOSIS — R262 Difficulty in walking, not elsewhere classified: Secondary | ICD-10-CM

## 2021-12-24 DIAGNOSIS — R2689 Other abnormalities of gait and mobility: Secondary | ICD-10-CM

## 2021-12-24 NOTE — Therapy (Addendum)
[facility] [address] [place], [place], [address] Phone: [phone]   Fax:  [phone]  Physical Therapy Treatment Progress Note Reporting Period 11/30 to 12/24/2021  See note below for Objective Data and Assessment of Progress/Goals.   PHYSICAL THERAPY DISCHARGE SUMMARY  Visits from Start of Care: 10  Current functional level related to goals / functional outcomes: See below    Remaining deficits: See below    Education / Equipment: See assessment    Patient agrees to discharge. Patient goals were partially met. Patient is being discharged due to being pleased with the current functional level.   Patient Details  Name: Terri Logan MRN: [id number] Date of Birth: 09-Jun-1966 Referring Provider (PT): Dondra Prader NP   Encounter Date: 12/24/2021   PT End of Session - 12/24/21 1530     Visit Number 10    Number of Visits 18    Date for PT Re-Evaluation 01/05/22    Authorization Type Medicare A/ Medicaid 2nd    Progress Note Due on Visit 10    PT Start Time 1447    PT Stop Time 1528    PT Time Calculation (min) 41 min    Activity Tolerance Patient tolerated treatment well    Behavior During Therapy WFL for tasks assessed/performed             Past Medical History:  Diagnosis Date   Arthritis    Diabetes mellitus without complication ([place])    GERD (gastroesophageal reflux disease)    Hypertension    Sarcoidosis    Sleep apnea     Past Surgical History:  Procedure Laterality Date   CHOLECYSTECTOMY     15 years ago [facility]   DORSAL COMPARTMENT RELEASE Left 07/10/2015   Procedure: RELEASE DORSAL COMPARTMENT (DEQUERVAIN);  Surgeon: Carole Civil, MD;  Location: [facility];  Service: Orthopedics;  Laterality: Left;   KNEE ARTHROSCOPY WITH MEDIAL MENISECTOMY Right 10/20/2016   Procedure: KNEE ARTHROSCOPY WITH MEDIAL MENISECTOMY;  Surgeon: Carole Civil, MD;  Location: [facility];  Service: Orthopedics;   Laterality: Right;   ORIF ANKLE FRACTURE Right    TOTAL KNEE ARTHROPLASTY Right 11/05/2021   Procedure: RIGHT TOTAL KNEE ARTHROPLASTY;  Surgeon: Newt Minion, MD;  Location: [facility];  Service: Orthopedics;  Laterality: Right;    There were no vitals filed for this visit.   Subjective Assessment - 12/24/21 1453     Subjective Pt stated she went out on date last night with husband and used [facility] for first time outside with good tolerance.    Pertinent History RT TKA 11/05/21    Patient Stated Goals Get back full ROM, weight loss, get active    Currently in Pain? Yes    Pain Score 1     Pain Location Knee    Pain Orientation Right    Pain Descriptors / Indicators Aching    Pain Type Surgical pain    Pain Onset More than a month ago    Pain Frequency Intermittent    Aggravating Factors  bending    Pain Relieving Factors ice, meds    Effect of Pain on Daily Activities limits                [facility] PT Assessment - 12/24/21 0001       Assessment   Medical Diagnosis RT TKA    Referring Provider (PT) Dondra Prader NP    Onset Date/Surgical Date 11/05/21    Next MD  Visit released    Prior Therapy no      Observation/Other Assessments   Focus on Therapeutic Outcomes (FOTO)  54 % function   was 22% function     AROM   AROM Assessment Site Knee    Right/Left Knee Right;Left    Right Knee Extension 0    Right Knee Flexion 108    Left Knee Extension 0    Left Knee Flexion 105      Strength   Right Hip Flexion 4/5   was 4/5   Right Hip Extension 4/5   was 4/5   Right Hip ABduction 4+/5    Left Hip Flexion 3+/5   was 5/5   Left Hip Extension 4/5   was 4/5   Left Hip ABduction 4+/5    Right Knee Flexion 5/5   was 4/5   Right Knee Extension 4+/5    Left Knee Flexion 4+/5    Left Knee Extension 5/5      Transfers   Five time sit to stand comments  13.62 no HHA 5STS standard height      Ambulation/Gait   Ambulation/Gait Yes    Ambulation/Gait Assistance 6: Modified  independent (Device/Increase time)    Ambulation Distance (Feet) 275 Feet    Assistive device Straight cane    Gait Pattern Step-through pattern;Within Functional Limits    Ambulation Surface Level;Indoor    Stairs Yes    Stairs Assistance 7: Independent    Stair Management Technique Two rails;Alternating pattern    Number of Stairs 4   3RT reciprocal pattern   Height of Stairs 7    Gait Comments 2MWT                           [facility] Adult PT Treatment/Exercise - 12/24/21 0001       Exercises   Exercises Knee/Hip      Knee/Hip Exercises: Stretches   Quad Stretch 3 reps;30 seconds    Quad Stretch Limitations prone with rope    Knee: Self-Stretch to increase Flexion 5 reps;10 seconds      Knee/Hip Exercises: Aerobic   Stationary Bike full rev 4' seat 18      Knee/Hip Exercises: Standing   Stairs 3RT 7in step height reciprocal pattern HR A      Knee/Hip Exercises: Seated   Heel Slides 10 reps      Knee/Hip Exercises: Supine   Knee Extension Limitations 0    Knee Flexion Limitations 108      Knee/Hip Exercises: Prone   Hamstring Curl 10 reps    Contract/Relax to Increase Flexion 3                       PT Short Term Goals - 12/24/21 1457       PT SHORT TERM GOAL #1   Title Patient will be independent with initial HEP and self-management strategies to improve functional outcomes    Baseline 12/30:  Reports complaince with advanced HEP      PT SHORT TERM GOAL #2   Title Patient will have RT knee AROM 5-100 degrees to improve functional mobility and facilitate squatting to pick up items from floor.    Baseline 0-103    Status Achieved               PT Long Term Goals - 12/24/21 1458       PT LONG TERM GOAL #1  Title Patient will have RT knee AROM 0-120 degrees to improve functional mobility and facilitate squatting to pick up items from floor.    Baseline 12/30: 0-105 was 10-85 degrees    Status On-going      PT LONG TERM  GOAL #2   Title Patient will improve FOTO score to predicted value to indicate improvement in functional outcomes    Baseline 12/30:  FOTO score 54% was 22% (FOTO predicated 33    Status Achieved      PT LONG TERM GOAL #3   Title Patient will report at least 75% overall improvement in subjective complaint to indicate improvement in ability to perform ADLs.    Baseline 12/30: reports 75% improvements    Status Achieved      PT LONG TERM GOAL #4   Title Patient will have equal to or > 4+/5 MMT throughout RLE to improve ability to perform functional mobility, stair ambulation and ADLs.    Baseline 12/24/21: see MMT    Status Partially Met      PT LONG TERM GOAL #5   Title Patient will be able to ambulate at least 375 feet during 2MWT with LRAD to demonstrate improved ability to perform functional mobility and associated tasks.    Baseline 12/30: 21ft 2MWT with SPC    Status On-going                   Plan - 12/24/21 1800     Clinical Impression Statement 10th visit progress note.  Pt arrived wiht SPC and reports she has began walking outside with.  Pt very motivated with therapy and reports she will return to her personal training multiple times per week at [facility].  Pt feels she has improved 75% and feels ready for discharge.  AROM 0-105, improved strength and increased cadence noted during 2MWT.  Reviewed remaining deficits and benefits of continuing therapy to improve ROM and functional strengthening.  Pt stated she feels comfortable and ready to do this at home.  Educated stretches and strenghtening exercises to improve these deficitis.    Personal Factors and Comorbidities Comorbidity 3+    Examination-Activity Limitations Transfers;Hygiene/Grooming;Dressing;Stand;Carry;Stairs;Squat;Bend;Sleep;Bed Mobility;Locomotion Level;Lift    Examination-Participation Restrictions Cleaning;Community Activity;Driving;Yard Work;Laundry;Shop    Stability/Clinical Decision Making  Stable/Uncomplicated    Clinical Decision Making Low    Rehab Potential Good    PT Frequency 3x / week    PT Duration 6 weeks    PT Treatment/Interventions ADLs/Self Care Home Management;Biofeedback;Cryotherapy;Stair training;Gait training;Orthotic Fit/Training;Patient/family education;DME Instruction;Functional mobility training;Therapeutic activities;Iontophoresis 4mg /ml Dexamethasone;Therapeutic exercise;Moist Heat;Electrical Stimulation;Traction;Manual lymph drainage;Balance training;Manual techniques;Vasopneumatic Device;Taping;Energy conservation;Splinting;Dry needling;Neuromuscular re-education;Parrafin;Fluidtherapy;Contrast Bath;Ultrasound;Compression bandaging;Visual/perceptual remediation/compensation;Scar mobilization;Passive range of motion;Spinal Manipulations;Joint Manipulations    PT Next Visit Plan DC to HEP per pt happy with current progress.    PT Home Exercise Plan HEP Review: heel prop, glute set, heel slide with strap, calf stretch with strap 12/21 heel raise, STS, tandem stance, seated knee flexion; 12/30: prone quad stretch, knee drives, stairs, STS, squats, heel slides with increased hold time.    Consulted and Agree with Plan of Care Patient             Patient will benefit from skilled therapeutic intervention in order to improve the following deficits and impairments:  Abnormal gait, Decreased endurance, Hypomobility, Increased edema, Decreased activity tolerance, Decreased strength, Increased fascial restricitons, Pain, Decreased balance, Decreased mobility, Difficulty walking, Impaired flexibility, Decreased range of motion, Improper body mechanics  Visit Diagnosis: Acute pain of right knee  Right knee pain,  unspecified chronicity  Stiffness of right knee, not elsewhere classified  Other abnormalities of gait and mobility  Difficulty in walking, not elsewhere classified     Problem List Patient Active Problem List   Diagnosis Date Noted   Total knee  replacement status, right 11/05/2021   Unilateral primary osteoarthritis, right knee    Medial meniscus, posterior horn derangement, right    De Quervain's syndrome (tenosynovitis)    DIABETES MELLITUS, UNCONTROLLED 07/22/2009   ANKLE, ARTHRITIS, DEGEN./OSTEO 08/21/2008   ADJUSTMENT DISORDER WITH DEPRESSED MOOD 08/13/2008   ALLERGIC RHINITIS 05/15/2008   LEG PAIN, CHRONIC 10/05/2007   LEUKOCYTOSIS 04/05/2007   SORE THROAT 04/05/2007   SARCOIDOSIS 01/29/2007   OBESITY, MORBID 01/29/2007   OBSTRUCTIVE SLEEP APNEA 01/29/2007   MIGRAINE HEADACHE 01/29/2007   HYPERTENSION 01/29/2007   OSTEOARTHRITIS 01/29/2007   Ihor Austin, LPTA/CLT; CBIS [phone]  Aldona Lento, PTA 12/24/2021, 6:11 PM  [facility] [address] [place], [place], [address] Phone: [phone]   Fax:  [phone]  Name: Terri Logan MRN: [id number] Date of Birth: 1966-02-15

## 2022-04-26 ENCOUNTER — Telehealth: Payer: Self-pay | Admitting: Orthopedic Surgery

## 2022-04-26 NOTE — Telephone Encounter (Signed)
Called pt to sch appt for left knee pain per mychart message ?

## 2022-06-22 ENCOUNTER — Ambulatory Visit (INDEPENDENT_AMBULATORY_CARE_PROVIDER_SITE_OTHER): Payer: Medicare Other | Admitting: Family

## 2022-06-22 DIAGNOSIS — M1712 Unilateral primary osteoarthritis, left knee: Secondary | ICD-10-CM

## 2022-06-22 DIAGNOSIS — M25562 Pain in left knee: Secondary | ICD-10-CM | POA: Diagnosis not present

## 2022-06-23 DIAGNOSIS — M1712 Unilateral primary osteoarthritis, left knee: Secondary | ICD-10-CM

## 2022-06-23 MED ORDER — METHYLPREDNISOLONE ACETATE 40 MG/ML IJ SUSP
40.0000 mg | INTRAMUSCULAR | Status: AC | PRN
  Administered 2022-06-23: 40 mg via INTRA_ARTICULAR

## 2022-06-23 MED ORDER — LIDOCAINE HCL 1 % IJ SOLN
5.0000 mL | INTRAMUSCULAR | Status: AC | PRN
  Administered 2022-06-23: 5 mL

## 2022-06-23 NOTE — Progress Notes (Signed)
Office Visit Note   Patient: Terri Logan           Date of Birth: 08-25-1966           MRN: [id number] Visit Date: 06/22/2022              Requested by: Benita Stabile, MD [address] [place],  [place] [address] PCP: Benita Stabile, MD  Chief Complaint  Patient presents with   Left Knee - Pain      HPI: The patient is a 56 year old woman who presents today complaining of chronic left knee pain.  Has been having issues with mechanical symptoms pain pain that limits her ability to execute her IADLs.  Does have a history of osteoarthritis on the left.  She is status post right total knee arthroplasty November of last year and is quite pleased with the result.  She continues to be quite active working with physical therapy as well as exercising with a trainer at [employer].  She is eager to have total knee replacement on the left.  Assessment & Plan: Visit Diagnoses: No diagnosis found.  Plan: Depo-Medrol injection of the left knee these provide moderate interval relief.  We will plan for total knee arthroplasty later this summer.  She is thinking about doing it at the end of August early September.  Follow-Up Instructions: Return post op.   Left Knee Exam   Muscle Strength  The patient has normal left knee strength.  Tenderness  The patient is experiencing tenderness in the medial joint line and lateral joint line.  Range of Motion  The patient has normal left knee ROM.  Tests  Varus: negative Valgus: negative  Other  Erythema: absent Swelling: none      Patient is alert, oriented, no adenopathy, well-dressed, normal affect, normal respiratory effort.   Imaging: No results found. No images are attached to the encounter.  Labs: Lab Results  Component Value Date   HGBA1C 7.6 (H) 11/02/2021   HGBA1C 8.6 07/22/2009     Lab Results  Component Value Date   ALBUMIN 3.6 10/16/2019   ALBUMIN 4.2 07/08/2009    No results found for: "MG" No results  found for: "VD25OH"  No results found for: "PREALBUMIN"    Latest Ref Rng & Units 11/02/2021   10:18 AM 10/16/2019    4:33 PM 10/13/2016    1:52 PM  CBC EXTENDED  WBC 4.0 - 10.5 K/uL 9.3  7.2  10.3   RBC 3.87 - 5.11 MIL/uL 4.21  4.66  4.62   Hemoglobin 12.0 - 15.0 g/dL 41.2  87.8  67.6   HCT 36.0 - 46.0 % 36.5  39.3  39.5   Platelets 150 - 400 K/uL 312  286  355   NEUT# 1.7 - 7.7 K/uL  4.2  5.7   Lymph# 0.7 - 4.0 K/uL  2.3  3.4      There is no height or weight on file to calculate BMI.  Orders:  No orders of the defined types were placed in this encounter.  No orders of the defined types were placed in this encounter.    Procedures: Large Joint Inj: L knee on 06/23/2022 8:33 PM Indications: pain Details: 18 G 1.5 in needle, anteromedial approach Medications: 5 mL lidocaine 1 %; 40 mg methylPREDNISolone acetate 40 MG/ML Consent was given by the patient.      Clinical Data: No additional findings.  ROS:  All other systems negative, except as  noted in the HPI. Review of Systems  Objective: Vital Signs: There were no vitals taken for this visit.  Specialty Comments:  No specialty comments available.  PMFS History: 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   Unilateral primary osteoarthritis, right knee    Medial meniscus, posterior horn derangement, right    De Quervain's syndrome (tenosynovitis)    DIABETES MELLITUS, UNCONTROLLED 07/22/2009   ANKLE, ARTHRITIS, DEGEN./OSTEO 08/21/2008   ADJUSTMENT DISORDER WITH DEPRESSED MOOD 08/13/2008   ALLERGIC RHINITIS 05/15/2008   LEG PAIN, CHRONIC 10/05/2007   LEUKOCYTOSIS 04/05/2007   SORE THROAT 04/05/2007   SARCOIDOSIS 01/29/2007   OBESITY, MORBID 01/29/2007   OBSTRUCTIVE SLEEP APNEA 01/29/2007   MIGRAINE HEADACHE 01/29/2007   HYPERTENSION 01/29/2007   OSTEOARTHRITIS 01/29/2007   Past Medical History:  Diagnosis Date   Arthritis    Diabetes  mellitus without complication ([facility])    GERD (gastroesophageal reflux disease)    Hypertension    Sarcoidosis    Sleep apnea     No family history on file.  Past Surgical History:  Procedure Laterality Date   CHOLECYSTECTOMY     15 years ago [facility]   DORSAL COMPARTMENT RELEASE Left 07/10/2015   Procedure: RELEASE DORSAL COMPARTMENT (DEQUERVAIN);  Surgeon: Vickki Hearing, MD;  Location: [facility];  Service: Orthopedics;  Laterality: Left;   KNEE ARTHROSCOPY WITH MEDIAL MENISECTOMY Right 10/20/2016   Procedure: KNEE ARTHROSCOPY WITH MEDIAL MENISECTOMY;  Surgeon: Vickki Hearing, MD;  Location: [facility];  Service: Orthopedics;  Laterality: Right;   ORIF ANKLE FRACTURE Right    TOTAL KNEE ARTHROPLASTY Right 11/05/2021   Procedure: RIGHT TOTAL KNEE ARTHROPLASTY;  Surgeon: Nadara Mustard, MD;  Location: [facility];  Service: Orthopedics;  Laterality: Right;   Social History   Occupational History   Not on file  Tobacco Use   Smoking status: Former    Packs/day: 0.50    Years: 5.00    Total pack years: 2.50    Types: Cigarettes    Quit date: 07/05/1994    Years since quitting: 27.9   Smokeless tobacco: Never  Substance and Sexual Activity   Alcohol use: No   Drug use: No   Sexual activity: Yes    Birth control/protection: Surgical

## 2022-08-23 ENCOUNTER — Ambulatory Visit (HOSPITAL_COMMUNITY): Payer: Medicare Other | Attending: Orthopedic Surgery

## 2022-08-23 DIAGNOSIS — M1712 Unilateral primary osteoarthritis, left knee: Secondary | ICD-10-CM | POA: Insufficient documentation

## 2022-08-23 DIAGNOSIS — R2689 Other abnormalities of gait and mobility: Secondary | ICD-10-CM | POA: Diagnosis present

## 2022-08-23 DIAGNOSIS — M25562 Pain in left knee: Secondary | ICD-10-CM | POA: Diagnosis present

## 2022-08-23 DIAGNOSIS — G8929 Other chronic pain: Secondary | ICD-10-CM | POA: Insufficient documentation

## 2022-08-23 DIAGNOSIS — R262 Difficulty in walking, not elsewhere classified: Secondary | ICD-10-CM | POA: Insufficient documentation

## 2022-08-23 NOTE — Progress Notes (Signed)
Surgical Instructions    Your procedure is scheduled on Wednesday, September 6th, 2023.   Report to [facility] Main Entrance "A" at 06:30 A.M., then check in with the Admitting office.  Call this number if you have problems the morning of surgery:  [phone]   If you have any questions prior to your surgery date call [phone]: Open Monday-Friday 8am-4pm    Remember:  Do not eat after midnight the night before your surgery  You may drink clear liquids until 05:30 the morning of your surgery.   Clear liquids allowed are: Water, Non-Citrus Juices (without pulp), Carbonated Beverages, Clear Tea, Black Coffee ONLY (NO MILK, CREAM OR POWDERED CREAMER of any kind), and Gatorade  Patient Instructions  The night before surgery:  No food after midnight. ONLY clear liquids after midnight  The day of surgery (if you have diabetes): Drink ONE (1) 12 oz G2 given to you in your pre admission testing appointment by 05:30 the morning of surgery. Drink in one sitting. Do not sip.  This drink was given to you during your hospital  pre-op appointment visit.  Nothing else to drink after completing the  12 oz bottle of G2.         If you have questions, please contact your surgeon's office.     Take these medicines the morning of surgery with A SIP OF WATER:   amLODipine (NORVASC) atorvastatin (LIPITOR)  cyclobenzaprine (FLEXERIL)  escitalopram (LEXAPRO)  gabapentin (NEURONTIN)  If needed:  albuterol (VENTOLIN HFA) HYDROcodone-acetaminophen (NORCO)  Please bring all inhalers with you the day of surgery.    Follow your surgeon's instructions on when to stop Aspirin.  If no instructions were given by your surgeon then you will need to call the office to get those instructions.     As of today, STOP taking any Aspirin (unless otherwise instructed by your surgeon) Aleve, Naproxen, Ibuprofen, Motrin, Advil, Goody's, BC's, all herbal medications, fish oil, and all vitamins.   WHAT DO I  DO ABOUT MY DIABETES MEDICATION?   Do not take metFORMIN (GLUCOPHAGE) the morning of surgery.  Do not take Ozempic the day of surgery.   THE NIGHT BEFORE SURGERY, hold insulin degludec (TRESIBA FLEXTOUCH)       THE MORNING OF SURGERY, hold insulin degludec (TRESIBA FLEXTOUCH)   HOW TO MANAGE YOUR DIABETES BEFORE AND AFTER SURGERY  Why is it important to control my blood sugar before and after surgery? Improving blood sugar levels before and after surgery helps healing and can limit problems. A way of improving blood sugar control is eating a healthy diet by:  Eating less sugar and carbohydrates  Increasing activity/exercise  Talking with your doctor about reaching your blood sugar goals High blood sugars (greater than 180 mg/dL) can raise your risk of infections and slow your recovery, so you will need to focus on controlling your diabetes during the weeks before surgery. Make sure that the doctor who takes care of your diabetes knows about your planned surgery including the date and location.  How do I manage my blood sugar before surgery? Check your blood sugar at least 4 times a day, starting 2 days before surgery, to make sure that the level is not too high or low.  Check your blood sugar the morning of your surgery when you wake up and every 2 hours until you get to the Short Stay unit.  If your blood sugar is less than 70 mg/dL, you will need to treat for low blood  sugar: Do not take insulin. Treat a low blood sugar (less than 70 mg/dL) with  cup of clear juice (cranberry or apple), 4 glucose tablets, OR glucose gel. Recheck blood sugar in 15 minutes after treatment (to make sure it is greater than 70 mg/dL). If your blood sugar is not greater than 70 mg/dL on recheck, call [phone] for further instructions. Report your blood sugar to the short stay nurse when you get to Short Stay.  If you are admitted to the hospital after surgery: Your blood sugar will be checked by  the staff and you will probably be given insulin after surgery (instead of oral diabetes medicines) to make sure you have good blood sugar levels. The goal for blood sugar control after surgery is 80-180 mg/dL.     The day of surgery:          Do not wear jewelry or makeup. Do not wear lotions, powders, perfume, or deodorant. Do not shave 48 hours prior to surgery.   Do not bring valuables to the hospital. Do not wear nail polish, gel polish, artificial nails, or any other type of covering on natural nails (fingers and toes) If you have artificial nails or gel coating that need to be removed by a nail salon, please have this removed prior to surgery. Artificial nails or gel coating may interfere with anesthesia's ability to adequately monitor your vital signs.  [place] [facility] is not responsible for any belongings or valuables.    Do NOT Smoke (Tobacco/Vaping)  24 hours prior to your procedure  If you use a CPAP at night, you may bring your mask for your overnight stay.   Contacts, glasses, hearing aids, dentures or partials may not be worn into surgery, please bring cases for these belongings   For patients admitted to the hospital, discharge time will be determined by your treatment team.   Patients discharged the day of surgery will not be allowed to drive home, and someone needs to stay with them for 24 hours.   SURGICAL WAITING ROOM VISITATION Patients having surgery or a procedure may have no more than 2 support people in the waiting area - these visitors may rotate.   Children under the age of 2 must have an adult with them who is not the patient. If the patient needs to stay at the hospital during part of their recovery, the visitor guidelines for inpatient rooms apply. Pre-op nurse will coordinate an appropriate time for 1 support person to accompany patient in pre-op.  This support person may not rotate.   Please refer to the [facility] website for the visitor guidelines for  Inpatients (after your surgery is over and you are in a regular room).    Special instructions:    Oral Hygiene is also important to reduce your risk of infection.  Remember - BRUSH YOUR TEETH THE MORNING OF SURGERY WITH YOUR REGULAR TOOTHPASTE   [place]- Preparing For Surgery  Before surgery, you can play an important role. Because skin is not sterile, your skin needs to be as free of germs as possible. You can reduce the number of germs on your skin by washing with CHG (chlorahexidine gluconate) Soap before surgery.  CHG is an antiseptic cleaner which kills germs and bonds with the skin to continue killing germs even after washing.     Please do not use if you have an allergy to CHG or antibacterial soaps. If your skin becomes reddened/irritated stop using the CHG.  Do not  shave (including legs and underarms) for at least 48 hours prior to first CHG shower. It is OK to shave your face.  Please follow these instructions carefully.     Shower the NIGHT BEFORE SURGERY and the MORNING OF SURGERY with CHG Soap.   If you chose to wash your hair, wash your hair first as usual with your normal shampoo. After you shampoo, rinse your hair and body thoroughly to remove the shampoo.  Then [employer] and genitals (private parts) with your normal soap and rinse thoroughly to remove soap.  After that Use CHG Soap as you would any other liquid soap. You can apply CHG directly to the skin and wash gently with a scrungie or a clean washcloth.   Apply the CHG Soap to your body ONLY FROM THE NECK DOWN.  Do not use on open wounds or open sores. Avoid contact with your eyes, ears, mouth and genitals (private parts). Wash Face and genitals (private parts)  with your normal soap.   Wash thoroughly, paying special attention to the area where your surgery will be performed.  Thoroughly rinse your body with warm water from the neck down.  DO NOT shower/wash with your normal soap after using and rinsing off  the CHG Soap.  Pat yourself dry with a CLEAN TOWEL.  Wear CLEAN PAJAMAS to bed the night before surgery  Place CLEAN SHEETS on your bed the night before your surgery  DO NOT SLEEP WITH PETS.   Day of Surgery:  Take a shower with CHG soap. Wear Clean/Comfortable clothing the morning of surgery Do not apply any deodorants/lotions.   Remember to brush your teeth WITH YOUR REGULAR TOOTHPASTE.    If you received a COVID test during your pre-op visit, it is requested that you wear a mask when out in public, stay away from anyone that may not be feeling well, and notify your surgeon if you develop symptoms. If you have been in contact with anyone that has tested positive in the last 10 days, please notify your surgeon.    Please read over the following fact sheets that you were given.

## 2022-08-23 NOTE — Therapy (Signed)
OUTPATIENT PHYSICAL THERAPY LOWER EXTREMITY EVALUATION   Patient Name: Terri Logan MRN: [id number] DOB:[birth year], 56 y.o., female Today's Date: 08/23/2022   PT End of Session - 08/23/22 1100     Visit Number 1    Number of Visits 1    Authorization Type [employer] Medicare    Authorization Time Period no deductable, no VL, no auth    Progress Note Due on Visit 10    PT Start Time 1030    PT Stop Time 1101    PT Time Calculation (min) 31 min             Past Medical History:  Diagnosis Date   Arthritis    Diabetes mellitus without complication (HCC)    GERD (gastroesophageal reflux disease)    Hypertension    Sarcoidosis    Sleep apnea    Past Surgical History:  Procedure Laterality Date   CHOLECYSTECTOMY     15 years ago [facility]   DORSAL COMPARTMENT RELEASE Left 07/10/2015   Procedure: RELEASE DORSAL COMPARTMENT (DEQUERVAIN);  Surgeon: Carole Civil, MD;  Location: [facility];  Service: Orthopedics;  Laterality: Left;   KNEE ARTHROSCOPY WITH MEDIAL MENISECTOMY Right 10/20/2016   Procedure: KNEE ARTHROSCOPY WITH MEDIAL MENISECTOMY;  Surgeon: Carole Civil, MD;  Location: [facility];  Service: Orthopedics;  Laterality: Right;   ORIF ANKLE FRACTURE Right    TOTAL KNEE ARTHROPLASTY Right 11/05/2021   Procedure: RIGHT TOTAL KNEE ARTHROPLASTY;  Surgeon: Newt Minion, MD;  Location: [facility];  Service: Orthopedics;  Laterality: Right;   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   Unilateral primary osteoarthritis, right knee    Medial meniscus, posterior horn derangement, right    De Quervain's syndrome (tenosynovitis)    DIABETES MELLITUS, UNCONTROLLED 07/22/2009   ANKLE, ARTHRITIS, DEGEN./OSTEO 08/21/2008   ADJUSTMENT DISORDER WITH DEPRESSED MOOD 08/13/2008   ALLERGIC RHINITIS 05/15/2008   LEG PAIN, CHRONIC 10/05/2007   LEUKOCYTOSIS 04/05/2007   SORE THROAT 04/05/2007   SARCOIDOSIS 01/29/2007   OBESITY,  MORBID 01/29/2007   OBSTRUCTIVE SLEEP APNEA 01/29/2007   MIGRAINE HEADACHE 01/29/2007   HYPERTENSION 01/29/2007   OSTEOARTHRITIS 01/29/2007    PCP: Celene Squibb, MD  REFERRING PROVIDER:   Newt Minion, MD     REFERRING DIAG: TOTAL KNEE ARTHROPLASTY   THERAPY DIAG:  Primary osteoarthritis of left knee  Difficulty in walking, not elsewhere classified  Other abnormalities of gait and mobility  Chronic pain of left knee  Rationale for Evaluation and Treatment Rehabilitation  ONSET DATE: will have surgery 08/31/22  SUBJECTIVE:   SUBJECTIVE STATEMENT: Had right knee done October of last year.  Will have left one done 08/31/22  PERTINENT HISTORY: Right TKA 10/ 2022  PAIN:  Are you having pain? Yes: NPRS scale: 5-7/10 Pain location: left knee Pain description: aching, sore, stabbing Aggravating factors: night time, standing, walking bending Relieving factors: brace, icy hot  PRECAUTIONS: None  WEIGHT BEARING RESTRICTIONS No  FALLS:  Has patient fallen in last 6 months? Yes. Number of falls 1  LIVING ENVIRONMENT: Lives with: lives with their spouse Lives in: Mobile home Stairs: Yes: External: 4 steps; on right going up, on left going up, and can reach both Has following equipment at home: Single point cane, Walker - 2 wheeled, Shower bench, and bed side commode  OCCUPATION: dissabled  PLOF: Independent  PATIENT GOALS prepare for surgery   OBJECTIVE:   DIAGNOSTIC FINDINGS: none in  epic  PATIENT SURVEYS:  FOTO 43  COGNITION:  Overall cognitive status: Within functional limits for tasks assessed     PALPATION: General tenderness left knee  LOWER EXTREMITY ROM:  Active ROM Right eval Left eval  Hip flexion    Hip extension    Hip abduction    Hip adduction    Hip internal rotation    Hip external rotation    Knee flexion  102  Knee extension  -10  Ankle dorsiflexion    Ankle plantarflexion    Ankle inversion    Ankle eversion     (Blank  rows = not tested)  LOWER EXTREMITY MMT:  MMT Right eval Left eval  Hip flexion 5 4  Hip extension    Hip abduction    Hip adduction    Hip internal rotation    Hip external rotation    Knee flexion 5 4-  Knee extension 5 3-  Ankle dorsiflexion 5 4+  Ankle plantarflexion    Ankle inversion    Ankle eversion     (Blank rows = not tested)   FUNCTIONAL TESTS:  5 times sit to stand: 38 sec  GAIT: Distance walked: 50 Assistive device utilized: Single point cane Level of assistance: Modified independence Comments: antalgic gait; takes extra time    TODAY'S TREATMENT: Physical therapy evaluation and HEP  review; instruction  PATIENT EDUCATION:  Education details: Patient educated on exam findings, POC, scope of PT, HEP. Person educated: Patient Education method: Explanation, Demonstration, and Handouts Education comprehension: verbalized understanding, returned demonstration, verbal cues required, and tactile cues required    HOME EXERCISE PROGRAM: Access Code: [email or web address] URL: [email or web address] Date: 08/23/2022 Prepared by: [facility]  Exercises - Supine Quad Set  - 1 x daily - 7 x weekly - 3 sets - 10 reps - Supine Heel Slide  - 1 x daily - 7 x weekly - 3 sets - 10 reps - Supine Ankle Pumps  - 1 x daily - 7 x weekly - 3 sets - 10 reps - Supine Hip Abduction  - 1 x daily - 7 x weekly - 3 sets - 10 reps - Seated Heel Slide  - 1 x daily - 7 x weekly - 3 sets - 10 reps - Seated Long Arc Quad  - 1 x daily - 7 x weekly - 3 sets - 10 reps - Seated Heel Toe Raises  - 1 x daily - 7 x weekly - 3 sets - 10 reps  ASSESSMENT:  CLINICAL IMPRESSION: Patient is a 56 y.o. female who was seen today for physical therapy evaluation and treatment for Osteoarthritis Left Knee in preparation for TOTAL KNEE ARTHROPLASTY 08/31/22. Patient presents with pain, limited mobility of the left knee, antalgic gait and left leg weakness all which negatively impact patient's  ability to walk, navigate steps, sleep and perform household tasks.  Patient will benefit from further skilled therapy services after her knee replacement surgery to address deficits and promote optimal functional mobility.    OBJECTIVE IMPAIRMENTS Abnormal gait, decreased activity tolerance, decreased balance, decreased endurance, decreased mobility, difficulty walking, decreased ROM, decreased strength, hypomobility, increased edema, increased fascial restrictions, impaired perceived functional ability, impaired flexibility, and pain.   ACTIVITY LIMITATIONS carrying, lifting, bending, sitting, standing, squatting, sleeping, stairs, transfers, bathing, toileting, locomotion level, and caring for others  PARTICIPATION LIMITATIONS: meal prep, cleaning, laundry, shopping, community activity, and yard work   [employer] POTENTIAL: Good  CLINICAL DECISION MAKING: Stable/uncomplicated  EVALUATION COMPLEXITY: Low  GOALS: Goals reviewed with patient? No  SHORT TERM GOALS: Target date: 08/23/2022   patient will be independent with initial HEP Baseline: Goal status: met   LONG TERM GOALS: Target date:  post surgery     Patient will be independent with advanced HEP and self management strategies to improve quality of life and functional outcomes. Baseline:  Goal status: INITIAL  2.  Patient will improve FOTO score to predicted value to demonstrate improved functional mobility  Baseline:  Goal status: INITIAL  3.  Patient will increase left knee MMTs to 5/5 to promote return to ambulation community distances with minimal deviation.   Baseline:  Goal status: initial 4. Patient will increase left knee mobility AROM -2 to 120 to promote return to navigating steps reciprocal pattern safely.   Baseline:  Goals status: initial   PLAN: PT FREQUENCY: 1x/week  PT DURATION: 1 week PLANNED INTERVENTIONS: Therapeutic exercises, Therapeutic activity, Neuromuscular re-education, Balance training,  Gait training, Patient/Family education, Joint manipulation, Joint mobilization, Stair training, Orthotic/Fit training, DME instructions, Aquatic Therapy, Dry Needling, Electrical stimulation, Spinal manipulation, Spinal mobilization, Cryotherapy, Moist heat, Compression bandaging, scar mobilization, Splintting, Taping, Traction, Ultrasound, Ionotophoresis 70m/ml Dexamethasone, and Manual therapy  PLAN FOR NEXT SESSION: Review goals and HEP; will re-eval patient next visit as she will be s/p Left TKA 08/31/2022 update and add goals accordingly specifically for strength and mobility.   11:10 AM, 08/23/22 Hailly Fess Small Raudel Bazen MPT [facility] [place] #[phone]

## 2022-08-24 ENCOUNTER — Other Ambulatory Visit: Payer: Self-pay

## 2022-08-24 ENCOUNTER — Encounter (HOSPITAL_COMMUNITY): Payer: Self-pay

## 2022-08-24 ENCOUNTER — Encounter (HOSPITAL_COMMUNITY)
Admission: RE | Admit: 2022-08-24 | Discharge: 2022-08-24 | Disposition: A | Discharge status: Home / Self Care | Payer: Medicare Other | Source: Ambulatory Visit | Attending: Orthopedic Surgery | Admitting: Orthopedic Surgery

## 2022-08-24 VITALS — BP 147/75 | HR 79 | Temp 98.5°F | Resp 18 | Ht 67.0 in | Wt 345.2 lb

## 2022-08-24 DIAGNOSIS — E119 Type 2 diabetes mellitus without complications: Secondary | ICD-10-CM | POA: Diagnosis not present

## 2022-08-24 DIAGNOSIS — Z01812 Encounter for preprocedural laboratory examination: Secondary | ICD-10-CM | POA: Insufficient documentation

## 2022-08-24 DIAGNOSIS — Z01818 Encounter for other preprocedural examination: Secondary | ICD-10-CM

## 2022-08-24 LAB — BASIC METABOLIC PANEL
Anion gap: 10 (ref 5–15)
BUN: 18 mg/dL (ref 6–20)
CO2: 23 mmol/L (ref 22–32)
Calcium: 9.9 mg/dL (ref 8.9–10.3)
Chloride: 105 mmol/L (ref 98–111)
Creatinine, Ser: 1.33 mg/dL — ABNORMAL HIGH (ref 0.44–1.00)
GFR, Estimated: 47 mL/min — ABNORMAL LOW (ref >60)
Glucose, Bld: 135 mg/dL — ABNORMAL HIGH (ref 70–99)
Potassium: 4.5 mmol/L (ref 3.5–5.1)
Sodium: 138 mmol/L (ref 135–145)

## 2022-08-24 LAB — CBC
HCT: 38.4 % (ref 36.0–46.0)
Hemoglobin: 12.1 g/dL (ref 12.0–15.0)
MCH: 26.9 pg (ref 26.0–34.0)
MCHC: 31.5 g/dL (ref 30.0–36.0)
MCV: 85.3 fL (ref 80.0–100.0)
Platelets: 334 10*3/uL (ref 150–400)
RBC: 4.5 MIL/uL (ref 3.87–5.11)
RDW: 15.3 % (ref 11.5–15.5)
WBC: 8.4 10*3/uL (ref 4.0–10.5)
nRBC: 0 % (ref 0.0–0.2)

## 2022-08-24 LAB — GLUCOSE, CAPILLARY: Glucose-Capillary: 167 mg/dL — ABNORMAL HIGH (ref 70–99)

## 2022-08-24 LAB — SURGICAL PCR SCREEN
MRSA, PCR: NEGATIVE
Staphylococcus aureus: NEGATIVE

## 2022-08-24 LAB — HEMOGLOBIN A1C
Hgb A1c MFr Bld: 7.7 % — ABNORMAL HIGH (ref 4.8–5.6)
Mean Plasma Glucose: 174.29 mg/dL

## 2022-08-24 NOTE — Progress Notes (Signed)
PCP - Roe Rutherford, NP Cardiologist - denies  PPM/ICD - denies   Chest x-ray - 11/28/09 EKG - 11/02/21 Stress Test - denies ECHO - denies Cardiac Cath - denies  Sleep Study - 10+ years ago per pt, OSA+ CPAP - nightly  DM- Type 2 Fasting Blood Sugar - 120-130 Checks Blood Sugar 3-4 times a day  Blood Thinner Instructions: n/a Aspirin Instructions: pt instructed to f/u with surgeon for instructions on ASA  ERAS Protcol - yes PRE-SURGERY G2- given at PAT  COVID TEST- n/a   Anesthesia review: no  Patient denies shortness of breath, fever, cough and chest pain at PAT appointment   All instructions explained to the patient, with a verbal understanding of the material. Patient agrees to go over the instructions while at home for a better understanding. The opportunity to ask questions was provided.

## 2022-08-31 ENCOUNTER — Other Ambulatory Visit: Payer: Self-pay

## 2022-08-31 ENCOUNTER — Encounter (HOSPITAL_COMMUNITY)
Admission: RE | Disposition: A | Discharge status: Home / Self Care | Payer: Self-pay | Source: Home / Self Care | Attending: Orthopedic Surgery

## 2022-08-31 ENCOUNTER — Ambulatory Visit (HOSPITAL_COMMUNITY)
Admission: RE | Admit: 2022-08-31 | Discharge: 2022-09-01 | Disposition: A | Discharge status: Home / Self Care | Payer: Medicare Other | Attending: Orthopedic Surgery | Admitting: Orthopedic Surgery

## 2022-08-31 ENCOUNTER — Ambulatory Visit (HOSPITAL_BASED_OUTPATIENT_CLINIC_OR_DEPARTMENT_OTHER): Payer: Medicare Other | Admitting: Certified Registered"

## 2022-08-31 ENCOUNTER — Encounter (HOSPITAL_COMMUNITY): Payer: Self-pay | Admitting: Orthopedic Surgery

## 2022-08-31 ENCOUNTER — Ambulatory Visit (HOSPITAL_COMMUNITY): Payer: Medicare Other | Admitting: Certified Registered"

## 2022-08-31 DIAGNOSIS — Z7984 Long term (current) use of oral hypoglycemic drugs: Secondary | ICD-10-CM

## 2022-08-31 DIAGNOSIS — G4733 Obstructive sleep apnea (adult) (pediatric): Secondary | ICD-10-CM | POA: Diagnosis not present

## 2022-08-31 DIAGNOSIS — M1712 Unilateral primary osteoarthritis, left knee: Secondary | ICD-10-CM | POA: Insufficient documentation

## 2022-08-31 DIAGNOSIS — M179 Osteoarthritis of knee, unspecified: Secondary | ICD-10-CM | POA: Diagnosis present

## 2022-08-31 DIAGNOSIS — E119 Type 2 diabetes mellitus without complications: Secondary | ICD-10-CM

## 2022-08-31 DIAGNOSIS — Z87891 Personal history of nicotine dependence: Secondary | ICD-10-CM | POA: Diagnosis not present

## 2022-08-31 DIAGNOSIS — Z96652 Presence of left artificial knee joint: Secondary | ICD-10-CM

## 2022-08-31 DIAGNOSIS — I10 Essential (primary) hypertension: Secondary | ICD-10-CM | POA: Diagnosis not present

## 2022-08-31 DIAGNOSIS — Z79899 Other long term (current) drug therapy: Secondary | ICD-10-CM | POA: Diagnosis not present

## 2022-08-31 HISTORY — PX: TOTAL KNEE ARTHROPLASTY: SHX125

## 2022-08-31 LAB — GLUCOSE, CAPILLARY
Glucose-Capillary: 129 mg/dL — ABNORMAL HIGH (ref 70–99)
Glucose-Capillary: 150 mg/dL — ABNORMAL HIGH (ref 70–99)
Glucose-Capillary: 231 mg/dL — ABNORMAL HIGH (ref 70–99)
Glucose-Capillary: 199 mg/dL — ABNORMAL HIGH (ref 70–99)

## 2022-08-31 SURGERY — ARTHROPLASTY, KNEE, TOTAL
Anesthesia: Monitor Anesthesia Care | Site: Knee | Laterality: Left

## 2022-08-31 MED ORDER — OXYCODONE HCL 5 MG PO TABS
10.0000 mg | ORAL_TABLET | ORAL | Status: DC | PRN
  Administered 2022-09-01 (×2): 10 mg via ORAL
  Filled 2022-08-31 (×2): qty 2
  Filled 2022-08-31: qty 3

## 2022-08-31 MED ORDER — OXYCODONE HCL 5 MG PO TABS
5.0000 mg | ORAL_TABLET | ORAL | Status: DC | PRN
  Administered 2022-08-31: 10 mg via ORAL
  Administered 2022-08-31 (×2): 5 mg via ORAL
  Filled 2022-08-31 (×2): qty 2

## 2022-08-31 MED ORDER — ONDANSETRON HCL 4 MG PO TABS: 4.0000 mg | ORAL_TABLET | Freq: Four times a day (QID) | ORAL | Status: DC | PRN

## 2022-08-31 MED ORDER — INSULIN ASPART 100 UNIT/ML IJ SOLN
6.0000 [IU] | Freq: Three times a day (TID) | INTRAMUSCULAR | Status: DC
  Administered 2022-08-31 – 2022-09-01 (×3): 6 [IU] via SUBCUTANEOUS

## 2022-08-31 MED ORDER — HYDROMORPHONE HCL 1 MG/ML IJ SOLN
INTRAMUSCULAR | Status: AC
  Filled 2022-08-31: qty 0.5

## 2022-08-31 MED ORDER — METFORMIN HCL 500 MG PO TABS
1000.0000 mg | ORAL_TABLET | Freq: Two times a day (BID) | ORAL | Status: DC
  Administered 2022-08-31 – 2022-09-01 (×2): 1000 mg via ORAL
  Filled 2022-08-31 (×2): qty 2

## 2022-08-31 MED ORDER — IRBESARTAN 150 MG PO TABS
150.0000 mg | ORAL_TABLET | Freq: Every day | ORAL | Status: DC
  Administered 2022-08-31 – 2022-09-01 (×2): 150 mg via ORAL
  Filled 2022-08-31 (×2): qty 1

## 2022-08-31 MED ORDER — ATORVASTATIN CALCIUM 10 MG PO TABS
10.0000 mg | ORAL_TABLET | Freq: Every day | ORAL | Status: DC
  Administered 2022-09-01: 10 mg via ORAL
  Filled 2022-08-31: qty 1

## 2022-08-31 MED ORDER — POLYETHYLENE GLYCOL 3350 17 G PO PACK: 17.0000 g | PACK | Freq: Every day | ORAL | Status: DC | PRN

## 2022-08-31 MED ORDER — POVIDONE-IODINE 10 % EX SWAB
2.0000 | Freq: Once | CUTANEOUS | Status: AC
  Administered 2022-08-31: 2 via TOPICAL

## 2022-08-31 MED ORDER — SPIRONOLACTONE 25 MG PO TABS
50.0000 mg | ORAL_TABLET | Freq: Every day | ORAL | Status: DC
  Administered 2022-08-31 – 2022-09-01 (×2): 50 mg via ORAL
  Filled 2022-08-31 (×2): qty 2

## 2022-08-31 MED ORDER — METHOCARBAMOL 1000 MG/10ML IJ SOLN: 500.0000 mg | Freq: Four times a day (QID) | INTRAVENOUS | Status: DC | PRN

## 2022-08-31 MED ORDER — BISACODYL 10 MG RE SUPP: 10.0000 mg | Freq: Every day | RECTAL | Status: DC | PRN

## 2022-08-31 MED ORDER — HYDROCHLOROTHIAZIDE 25 MG PO TABS
25.0000 mg | ORAL_TABLET | Freq: Every day | ORAL | Status: DC
  Administered 2022-08-31 – 2022-09-01 (×2): 25 mg via ORAL
  Filled 2022-08-31 (×2): qty 1

## 2022-08-31 MED ORDER — HYDROMORPHONE HCL 1 MG/ML IJ SOLN
0.5000 mg | INTRAMUSCULAR | Status: DC | PRN
  Administered 2022-08-31 – 2022-09-01 (×2): 1 mg via INTRAVENOUS
  Filled 2022-08-31 (×2): qty 1

## 2022-08-31 MED ORDER — LACTATED RINGERS IV SOLN: INTRAVENOUS | Status: DC

## 2022-08-31 MED ORDER — PROPOFOL 10 MG/ML IV BOLUS
INTRAVENOUS | Status: DC | PRN
  Administered 2022-08-31: 200 mg via INTRAVENOUS

## 2022-08-31 MED ORDER — AMLODIPINE BESYLATE 10 MG PO TABS
10.0000 mg | ORAL_TABLET | Freq: Every morning | ORAL | Status: DC
  Administered 2022-09-01: 10 mg via ORAL
  Filled 2022-08-31: qty 1

## 2022-08-31 MED ORDER — HYDROMORPHONE HCL 1 MG/ML IJ SOLN
INTRAMUSCULAR | Status: AC
  Filled 2022-08-31: qty 1

## 2022-08-31 MED ORDER — ESCITALOPRAM OXALATE 10 MG PO TABS
10.0000 mg | ORAL_TABLET | Freq: Every day | ORAL | Status: DC
  Administered 2022-08-31 – 2022-09-01 (×2): 10 mg via ORAL
  Filled 2022-08-31 (×2): qty 1

## 2022-08-31 MED ORDER — PHENYLEPHRINE HCL-NACL 20-0.9 MG/250ML-% IV SOLN
INTRAVENOUS | Status: DC | PRN
  Administered 2022-08-31: 25 ug/min via INTRAVENOUS

## 2022-08-31 MED ORDER — ASPIRIN 81 MG PO TBEC: 81.0000 mg | DELAYED_RELEASE_TABLET | Freq: Every day | ORAL | Status: DC

## 2022-08-31 MED ORDER — INSULIN ASPART 100 UNIT/ML IJ SOLN
4.0000 [IU] | Freq: Once | INTRAMUSCULAR | Status: AC
  Administered 2022-08-31: 4 [IU] via SUBCUTANEOUS

## 2022-08-31 MED ORDER — ORAL CARE MOUTH RINSE: 15.0000 mL | Freq: Once | OROMUCOSAL | Status: AC

## 2022-08-31 MED ORDER — LIDOCAINE 2% (20 MG/ML) 5 ML SYRINGE
INTRAMUSCULAR | Status: DC | PRN
  Administered 2022-08-31: 80 mg via INTRAVENOUS

## 2022-08-31 MED ORDER — METHOCARBAMOL 500 MG PO TABS
500.0000 mg | ORAL_TABLET | Freq: Four times a day (QID) | ORAL | Status: DC | PRN
  Administered 2022-08-31: 500 mg via ORAL
  Filled 2022-08-31: qty 1

## 2022-08-31 MED ORDER — FENTANYL CITRATE (PF) 250 MCG/5ML IJ SOLN
INTRAMUSCULAR | Status: AC
  Filled 2022-08-31: qty 5

## 2022-08-31 MED ORDER — CHLORHEXIDINE GLUCONATE 0.12 % MT SOLN: 15.0000 mL | Freq: Once | OROMUCOSAL | Status: AC

## 2022-08-31 MED ORDER — ACETAMINOPHEN 10 MG/ML IV SOLN
INTRAVENOUS | Status: AC
  Filled 2022-08-31: qty 100

## 2022-08-31 MED ORDER — MIDAZOLAM HCL 5 MG/5ML IJ SOLN
INTRAMUSCULAR | Status: DC | PRN
  Administered 2022-08-31: 2 mg via INTRAVENOUS

## 2022-08-31 MED ORDER — TRANEXAMIC ACID-NACL 1000-0.7 MG/100ML-% IV SOLN
INTRAVENOUS | Status: AC
  Filled 2022-08-31: qty 100

## 2022-08-31 MED ORDER — DIPHENHYDRAMINE HCL 12.5 MG/5ML PO ELIX: 12.5000 mg | ORAL_SOLUTION | ORAL | Status: DC | PRN

## 2022-08-31 MED ORDER — PHENOL 1.4 % MT LIQD: 1.0000 | OROMUCOSAL | Status: DC | PRN

## 2022-08-31 MED ORDER — ROCURONIUM BROMIDE 100 MG/10ML IV SOLN
INTRAVENOUS | Status: DC | PRN
  Administered 2022-08-31: 50 mg via INTRAVENOUS
  Administered 2022-08-31: 20 mg via INTRAVENOUS

## 2022-08-31 MED ORDER — ONDANSETRON HCL 4 MG/2ML IJ SOLN
INTRAMUSCULAR | Status: DC | PRN
  Administered 2022-08-31: 4 mg via INTRAVENOUS

## 2022-08-31 MED ORDER — FENTANYL CITRATE (PF) 100 MCG/2ML IJ SOLN
INTRAMUSCULAR | Status: DC | PRN
  Administered 2022-08-31: 100 ug via INTRAVENOUS

## 2022-08-31 MED ORDER — SODIUM CHLORIDE 0.9 % IR SOLN
Status: DC | PRN
  Administered 2022-08-31: 3000 mL

## 2022-08-31 MED ORDER — DOCUSATE SODIUM 100 MG PO CAPS
100.0000 mg | ORAL_CAPSULE | Freq: Two times a day (BID) | ORAL | Status: DC
  Administered 2022-08-31 – 2022-09-01 (×3): 100 mg via ORAL
  Filled 2022-08-31 (×3): qty 1

## 2022-08-31 MED ORDER — ACETAMINOPHEN 10 MG/ML IV SOLN
1000.0000 mg | Freq: Once | INTRAVENOUS | Status: DC | PRN
  Administered 2022-08-31: 1000 mg via INTRAVENOUS

## 2022-08-31 MED ORDER — HYDROMORPHONE HCL 1 MG/ML IJ SOLN
0.2500 mg | INTRAMUSCULAR | Status: DC | PRN
  Administered 2022-08-31 (×3): 0.5 mg via INTRAVENOUS

## 2022-08-31 MED ORDER — TRANEXAMIC ACID-NACL 1000-0.7 MG/100ML-% IV SOLN
1000.0000 mg | INTRAVENOUS | Status: AC
  Administered 2022-08-31: 1000 mg via INTRAVENOUS

## 2022-08-31 MED ORDER — METOCLOPRAMIDE HCL 5 MG PO TABS: 5.0000 mg | ORAL_TABLET | Freq: Three times a day (TID) | ORAL | Status: DC | PRN

## 2022-08-31 MED ORDER — GABAPENTIN 300 MG PO CAPS
300.0000 mg | ORAL_CAPSULE | Freq: Three times a day (TID) | ORAL | Status: DC
  Administered 2022-08-31 – 2022-09-01 (×3): 300 mg via ORAL
  Filled 2022-08-31 (×3): qty 1

## 2022-08-31 MED ORDER — SUCCINYLCHOLINE CHLORIDE 200 MG/10ML IV SOSY
PREFILLED_SYRINGE | INTRAVENOUS | Status: DC | PRN
  Administered 2022-08-31: 160 mg via INTRAVENOUS

## 2022-08-31 MED ORDER — CEFAZOLIN IN SODIUM CHLORIDE 3-0.9 GM/100ML-% IV SOLN
3.0000 g | INTRAVENOUS | Status: AC
  Administered 2022-08-31: 3 g via INTRAVENOUS

## 2022-08-31 MED ORDER — EPHEDRINE SULFATE-NACL 50-0.9 MG/10ML-% IV SOSY
PREFILLED_SYRINGE | INTRAVENOUS | Status: DC | PRN
  Administered 2022-08-31: 5 mg via INTRAVENOUS

## 2022-08-31 MED ORDER — MAGNESIUM CITRATE PO SOLN: 1.0000 | Freq: Once | ORAL | Status: DC | PRN

## 2022-08-31 MED ORDER — METOCLOPRAMIDE HCL 5 MG/ML IJ SOLN: 5.0000 mg | Freq: Three times a day (TID) | INTRAMUSCULAR | Status: DC | PRN

## 2022-08-31 MED ORDER — SODIUM CHLORIDE 0.9 % IV SOLN
2000.0000 mg | Freq: Once | INTRAVENOUS | Status: AC
Start: 2022-08-31 — End: 2022-08-31
  Administered 2022-08-31: 2000 mg via TOPICAL
  Filled 2022-08-31: qty 20

## 2022-08-31 MED ORDER — ASPIRIN 325 MG PO TBEC
325.0000 mg | DELAYED_RELEASE_TABLET | Freq: Every day | ORAL | Status: DC
  Administered 2022-09-01: 325 mg via ORAL
  Filled 2022-08-31: qty 1

## 2022-08-31 MED ORDER — 0.9 % SODIUM CHLORIDE (POUR BTL) OPTIME
TOPICAL | Status: DC | PRN
  Administered 2022-08-31: 1000 mL

## 2022-08-31 MED ORDER — ZOLPIDEM TARTRATE 5 MG PO TABS: 5.0000 mg | ORAL_TABLET | Freq: Every evening | ORAL | Status: DC | PRN

## 2022-08-31 MED ORDER — ONDANSETRON HCL 4 MG/2ML IJ SOLN: 4.0000 mg | Freq: Four times a day (QID) | INTRAMUSCULAR | Status: DC | PRN

## 2022-08-31 MED ORDER — CEFAZOLIN SODIUM-DEXTROSE 2-4 GM/100ML-% IV SOLN
INTRAVENOUS | Status: AC
  Filled 2022-08-31: qty 100

## 2022-08-31 MED ORDER — INSULIN ASPART 100 UNIT/ML IJ SOLN
0.0000 [IU] | Freq: Three times a day (TID) | INTRAMUSCULAR | Status: DC
  Administered 2022-08-31: 7 [IU] via SUBCUTANEOUS
  Administered 2022-09-01 (×2): 4 [IU] via SUBCUTANEOUS

## 2022-08-31 MED ORDER — HYDROMORPHONE HCL 1 MG/ML IJ SOLN
INTRAMUSCULAR | Status: DC | PRN
  Administered 2022-08-31: .5 mg via INTRAVENOUS

## 2022-08-31 MED ORDER — OXYCODONE HCL 5 MG PO TABS
ORAL_TABLET | ORAL | Status: AC
  Filled 2022-08-31: qty 1

## 2022-08-31 MED ORDER — INSULIN ASPART 100 UNIT/ML IJ SOLN: 0.0000 [IU] | INTRAMUSCULAR | Status: DC | PRN

## 2022-08-31 MED ORDER — ACETAMINOPHEN 325 MG PO TABS
325.0000 mg | ORAL_TABLET | Freq: Four times a day (QID) | ORAL | Status: DC | PRN
  Administered 2022-09-01: 650 mg via ORAL
  Filled 2022-08-31: qty 2

## 2022-08-31 MED ORDER — CEFAZOLIN IN SODIUM CHLORIDE 3-0.9 GM/100ML-% IV SOLN
INTRAVENOUS | Status: AC
  Filled 2022-08-31: qty 100

## 2022-08-31 MED ORDER — MENTHOL 3 MG MT LOZG: 1.0000 | LOZENGE | OROMUCOSAL | Status: DC | PRN

## 2022-08-31 MED ORDER — CEFAZOLIN SODIUM-DEXTROSE 2-4 GM/100ML-% IV SOLN
2.0000 g | Freq: Four times a day (QID) | INTRAVENOUS | Status: AC
  Administered 2022-08-31 (×2): 2 g via INTRAVENOUS
  Filled 2022-08-31 (×2): qty 100

## 2022-08-31 MED ORDER — SODIUM CHLORIDE 0.9 % IV SOLN: INTRAVENOUS | Status: DC

## 2022-08-31 MED ORDER — CHLORHEXIDINE GLUCONATE 0.12 % MT SOLN
OROMUCOSAL | Status: AC
  Administered 2022-08-31: 15 mL via OROMUCOSAL
  Filled 2022-08-31: qty 15

## 2022-08-31 MED ORDER — MIDAZOLAM HCL 2 MG/2ML IJ SOLN
INTRAMUSCULAR | Status: AC
  Filled 2022-08-31: qty 2

## 2022-08-31 MED ORDER — OXYCODONE HCL 5 MG PO TABS
ORAL_TABLET | ORAL | Status: AC
  Administered 2022-09-01: 5 mg via ORAL
  Filled 2022-08-31: qty 1

## 2022-08-31 MED ORDER — SUGAMMADEX SODIUM 200 MG/2ML IV SOLN
INTRAVENOUS | Status: DC | PRN
  Administered 2022-08-31: 500 mg via INTRAVENOUS

## 2022-08-31 MED ORDER — ALBUTEROL SULFATE (2.5 MG/3ML) 0.083% IN NEBU: 3.0000 mL | INHALATION_SOLUTION | RESPIRATORY_TRACT | Status: DC | PRN

## 2022-08-31 SURGICAL SUPPLY — 58 items
BAG COUNTER SPONGE SURGICOUNT (BAG) ×1 IMPLANT
BAG SPNG CNTER NS LX DISP (BAG) ×1
BLADE SAGITTAL 25.0X1.19X90 (BLADE) ×1 IMPLANT
BLADE SAW SGTL 13X75X1.27 (BLADE) ×1 IMPLANT
BLADE SURG 21 STRL SS (BLADE) ×2 IMPLANT
BNDG CMPR 5X6 CHSV STRCH STRL (GAUZE/BANDAGES/DRESSINGS) ×1
BNDG COHESIVE 6X5 TAN ST LF (GAUZE/BANDAGES/DRESSINGS) IMPLANT
BNDG COHESIVE 6X5 TAN STRL LF (GAUZE/BANDAGES/DRESSINGS) ×2 IMPLANT
BNDG GAUZE DERMACEA FLUFF 4 (GAUZE/BANDAGES/DRESSINGS) IMPLANT
BNDG GZE DERMACEA 4 6PLY (GAUZE/BANDAGES/DRESSINGS) ×1
BOWL SMART MIX CTS (DISPOSABLE) ×1 IMPLANT
BSPLAT TIB E 2 PG STRL KN LT (Knees) ×1 IMPLANT
COMP FEM PERS SZ 8 LT (Joint) ×1 IMPLANT
COMPONENT FEM PERS SZ 8 LT (Joint) IMPLANT
COMPONENT TIB 2 PEG SZ E LT KN (Knees) IMPLANT
COOLER ICEMAN CLASSIC (MISCELLANEOUS) ×1 IMPLANT
COVER SURGICAL LIGHT HANDLE (MISCELLANEOUS) ×1 IMPLANT
CUFF TOURN SGL QUICK 42 (TOURNIQUET CUFF) IMPLANT
DRAPE EXTREMITY T 121X128X90 (DISPOSABLE) ×1 IMPLANT
DRAPE HALF SHEET 40X57 (DRAPES) ×2 IMPLANT
DRAPE U-SHAPE 47X51 STRL (DRAPES) ×1 IMPLANT
DRSG ADAPTIC 3X8 NADH LF (GAUZE/BANDAGES/DRESSINGS) IMPLANT
DURAPREP 26ML APPLICATOR (WOUND CARE) ×1 IMPLANT
ELECT REM PT RETURN 9FT ADLT (ELECTROSURGICAL) ×1
ELECTRODE REM PT RTRN 9FT ADLT (ELECTROSURGICAL) ×1 IMPLANT
FACESHIELD WRAPAROUND (MASK) ×1 IMPLANT
FACESHIELD WRAPAROUND OR TEAM (MASK) ×1 IMPLANT
GAUZE PAD ABD 8X10 STRL (GAUZE/BANDAGES/DRESSINGS) IMPLANT
GAUZE SPONGE 4X4 12PLY STRL (GAUZE/BANDAGES/DRESSINGS) ×1 IMPLANT
GLOVE BIOGEL PI IND STRL 9 (GLOVE) ×1 IMPLANT
GLOVE SURG ORTHO 9.0 STRL STRW (GLOVE) ×1 IMPLANT
GOWN STRL REUS W/ TWL XL LVL3 (GOWN DISPOSABLE) ×2 IMPLANT
GOWN STRL REUS W/TWL XL LVL3 (GOWN DISPOSABLE) ×2
HANDPIECE INTERPULSE COAX TIP (DISPOSABLE) ×1
HDLS TROCR DRIL PIN KNEE 75 (PIN) ×1
IMPL PATELLA METAL SZ32X10 (Joint) IMPLANT
KIT BASIN OR (CUSTOM PROCEDURE TRAY) ×1 IMPLANT
KIT TURNOVER KIT B (KITS) ×1 IMPLANT
MANIFOLD NEPTUNE II (INSTRUMENTS) ×1 IMPLANT
NS IRRIG 1000ML POUR BTL (IV SOLUTION) ×1 IMPLANT
PACK TOTAL JOINT (CUSTOM PROCEDURE TRAY) ×1 IMPLANT
PAD ARMBOARD 7.5X6 YLW CONV (MISCELLANEOUS) ×1 IMPLANT
PAD COLD SHLDR WRAP-ON (PAD) ×1 IMPLANT
PIN DRILL HDLS TROCAR 75 4PK (PIN) IMPLANT
SCREW FEMALE HEX FIX 25X2.5 (ORTHOPEDIC DISPOSABLE SUPPLIES) IMPLANT
SET HNDPC FAN SPRY TIP SCT (DISPOSABLE) ×1 IMPLANT
STAPLER VISISTAT 35W (STAPLE) ×1 IMPLANT
STEM TIBIAL 10 8-11 EF POLY LT (Joint) IMPLANT
SUCTION FRAZIER HANDLE 10FR (MISCELLANEOUS)
SUCTION TUBE FRAZIER 10FR DISP (MISCELLANEOUS) IMPLANT
SUT VIC AB 0 CT1 27 (SUTURE) ×2
SUT VIC AB 0 CT1 27XBRD ANBCTR (SUTURE) ×1 IMPLANT
SUT VIC AB 1 CT1 36 (SUTURE) IMPLANT
SUT VIC AB 1 CTX 36 (SUTURE)
SUT VIC AB 1 CTX36XBRD ANBCTR (SUTURE) IMPLANT
TIBIA POR 2 PEG SZ E L KNEE (Knees) ×1 IMPLANT
TOWEL GREEN STERILE (TOWEL DISPOSABLE) ×1 IMPLANT
TOWEL GREEN STERILE FF (TOWEL DISPOSABLE) ×1 IMPLANT

## 2022-08-31 NOTE — Anesthesia Postprocedure Evaluation (Signed)
Anesthesia Post Note  Patient: Terri Logan  Procedure(s) Performed: LEFT TOTAL KNEE ARTHROPLASTY (Left: Knee)     Patient location during evaluation: PACU Anesthesia Type: Regional and General Level of consciousness: awake and alert Pain management: pain level controlled Vital Signs Assessment: post-procedure vital signs reviewed and stable Respiratory status: spontaneous breathing, nonlabored ventilation, respiratory function stable and patient connected to nasal cannula oxygen Cardiovascular status: blood pressure returned to baseline and stable Postop Assessment: no apparent nausea or vomiting Anesthetic complications: no   No notable events documented.  Last Vitals:  Vitals:   08/31/22 1345 08/31/22 1400  BP: (!) 153/89 126/86  Pulse: 88 89  Resp: 17 18  Temp: 36.6 C 36.5 C  SpO2: 99% 99%    Last Pain:  Vitals:   08/31/22 1400  TempSrc:   PainSc: 4                  Kikuye Korenek P Tonnette Zwiebel

## 2022-08-31 NOTE — Anesthesia Preprocedure Evaluation (Signed)
Anesthesia Evaluation  Patient identified by MRN, date of birth, ID band Patient awake    Reviewed: Allergy & Precautions, NPO status , Patient's Chart, lab work & pertinent test results  Airway Mallampati: III  TM Distance: >3 FB Neck ROM: Full    Dental no notable dental hx.    Pulmonary sleep apnea , former smoker,    Pulmonary exam normal        Cardiovascular hypertension, Pt. on medications  Rhythm:Regular Rate:Normal     Neuro/Psych  Headaches, negative psych ROS   GI/Hepatic Neg liver ROS, GERD  ,  Endo/Other  diabetes, Type 2, Oral Hypoglycemic Agents  Renal/GU negative Renal ROS  negative genitourinary   Musculoskeletal  (+) Arthritis , Osteoarthritis,    Abdominal Normal abdominal exam  (+)   Peds  Hematology negative hematology ROS (+)   Anesthesia Other Findings   Reproductive/Obstetrics                             Anesthesia Physical Anesthesia Plan  ASA: 3  Anesthesia Plan: MAC, Regional and Spinal   Post-op Pain Management: Regional block*   Induction: Intravenous  PONV Risk Score and Plan: 2 and Ondansetron, Dexamethasone, Midazolam and Treatment may vary due to age or medical condition  Airway Management Planned: Simple Face Mask, Natural Airway and Nasal Cannula  Additional Equipment: None  Intra-op Plan:   Post-operative Plan:   Informed Consent: I have reviewed the patients History and Physical, chart, labs and discussed the procedure including the risks, benefits and alternatives for the proposed anesthesia with the patient or authorized representative who has indicated his/her understanding and acceptance.     Dental advisory given  Plan Discussed with: CRNA  Anesthesia Plan Comments: (Lab Results      Component                Value               Date                      WBC                      8.4                 08/24/2022                HGB                       12.1                08/24/2022                HCT                      38.4                08/24/2022                MCV                      85.3                08/24/2022                PLT  334                 08/24/2022           Lab Results      Component                Value               Date                      NA                       138                 08/24/2022                K                        4.5                 08/24/2022                CO2                      23                  08/24/2022                GLUCOSE                  135 (H)             08/24/2022                BUN                      18                  08/24/2022                CREATININE               1.33 (H)            08/24/2022                CALCIUM                  9.9                 08/24/2022                GFRNONAA                 47 (L)              08/24/2022          )        Anesthesia Quick Evaluation

## 2022-08-31 NOTE — Discharge Instructions (Signed)

## 2022-08-31 NOTE — Evaluation (Signed)
Physical Therapy Evaluation Patient Details Name: Terri Logan MRN: [id number] DOB: 08-09-1966 Today's Date: 08/31/2022  History of Present Illness  Pt is a 56 y/o female s/p L TKA. PMH includes R TKA, HTN, and DM.  Clinical Impression  Pt admitted secondary to problem above with deficits below. Pt requiring min to min guard A to stand and transfer to chair. Pt with increased pain, but motivated to participate. Reviewed knee precautions with pt. Will continue to follow acutely.        Recommendations for follow up therapy are one component of a multi-disciplinary discharge planning process, led by the attending physician.  Recommendations may be updated based on patient status, additional functional criteria and insurance authorization.  Follow Up Recommendations Follow physician's recommendations for discharge plan and follow up therapies      Assistance Recommended at Discharge Intermittent Supervision/Assistance  Patient can return home with the following  A little help with walking and/or transfers;A little help with bathing/dressing/bathroom;Assistance with cooking/housework;Help with stairs or ramp for entrance;Assist for transportation    Equipment Recommendations None recommended by PT  Recommendations for Other Services       Functional Status Assessment Patient has had a recent decline in their functional status and demonstrates the ability to make significant improvements in function in a reasonable and predictable amount of time.     Precautions / Restrictions Precautions Precautions: Knee Precaution Booklet Issued: No Precaution Comments: Verbally reviewed knee precautions. Restrictions Weight Bearing Restrictions: Yes LLE Weight Bearing: Weight bearing as tolerated      Mobility  Bed Mobility Overal bed mobility: Needs Assistance Bed Mobility: Supine to Sit     Supine to sit: Min assist     General bed mobility comments: Assist for LLE management     Transfers Overall transfer level: Needs assistance Equipment used: Rolling walker (2 wheels) Transfers: Sit to/from Stand, Bed to chair/wheelchair/BSC Sit to Stand: Min assist Stand pivot transfers: Min guard         General transfer comment: Min A for lift assist and steadying to stand. Min guard for safety to take steps to recliner. Further mobility deferred secondary to pain    Ambulation/Gait                  Stairs            Wheelchair Mobility    Modified Rankin (Stroke Patients Only)       Balance Overall balance assessment: Needs assistance Sitting-balance support: No upper extremity supported, Feet supported Sitting balance-Leahy Scale: Fair     Standing balance support: Reliant on assistive device for balance Standing balance-Leahy Scale: Poor                               Pertinent Vitals/Pain Pain Assessment Pain Assessment: Faces Faces Pain Scale: Hurts even more Pain Location: L knee Pain Descriptors / Indicators: Grimacing, Guarding Pain Intervention(s): Limited activity within patient's tolerance, Monitored during session, Repositioned    Home Living Family/patient expects to be discharged to:: Private residence Living Arrangements: Spouse/significant other Available Help at Discharge: Family;Available 24 hours/day Type of Home: House Home Access: Stairs to enter Entrance Stairs-Rails: Doctor, general practice of Steps: 4   Home Layout: One level Home Equipment: Agricultural consultant (2 wheels);Rollator (4 wheels);BSC/3in1;Shower seat      Prior Function Prior Level of Function : Independent/Modified Independent  Hand Dominance        Extremity/Trunk Assessment   Upper Extremity Assessment Upper Extremity Assessment: Defer to OT evaluation    Lower Extremity Assessment Lower Extremity Assessment: LLE deficits/detail LLE Deficits / Details: Deficits consistent with post op  pain and weakness    Cervical / Trunk Assessment Cervical / Trunk Assessment: Normal  Communication   Communication: No difficulties  Cognition Arousal/Alertness: Awake/alert Behavior During Therapy: WFL for tasks assessed/performed Overall Cognitive Status: Within Functional Limits for tasks assessed                                          General Comments General comments (skin integrity, edema, etc.): Pt's husband present    Exercises     Assessment/Plan    PT Assessment Patient needs continued PT services  PT Problem List Decreased strength;Decreased activity tolerance;Decreased range of motion;Decreased balance;Decreased mobility;Decreased knowledge of use of DME;Decreased knowledge of precautions;Pain       PT Treatment Interventions DME instruction;Gait training;Stair training;Functional mobility training;Therapeutic activities;Therapeutic exercise;Balance training;Patient/family education    PT Goals (Current goals can be found in the Care Plan section)  Acute Rehab PT Goals Patient Stated Goal: to go home PT Goal Formulation: With patient Time For Goal Achievement: 09/14/22 Potential to Achieve Goals: Good    Frequency 7X/week     Co-evaluation               AM-PAC PT "6 Clicks" Mobility  Outcome Measure Help needed turning from your back to your side while in a flat bed without using bedrails?: None Help needed moving from lying on your back to sitting on the side of a flat bed without using bedrails?: A Little Help needed moving to and from a bed to a chair (including a wheelchair)?: A Little Help needed standing up from a chair using your arms (e.g., wheelchair or bedside chair)?: A Little Help needed to walk in hospital room?: A Little Help needed climbing 3-5 steps with a railing? : A Lot 6 Click Score: 18    End of Session Equipment Utilized During Treatment: Gait belt Activity Tolerance: Patient limited by pain Patient  left: in chair;with call bell/phone within reach;with family/visitor present Nurse Communication: Mobility status PT Visit Diagnosis: Other abnormalities of gait and mobility (R26.89);Difficulty in walking, not elsewhere classified (R26.2);Pain Pain - Right/Left: Left Pain - part of body: Knee    Time: 9371-6967 PT Time Calculation (min) (ACUTE ONLY): 17 min   Charges:   PT Evaluation $PT Eval Low Complexity: 1 Low          Cindee Salt, DPT  Acute Rehabilitation Services  Office: [phone]   Lehman Prom 08/31/2022, 4:23 PM

## 2022-08-31 NOTE — Plan of Care (Signed)
  Problem: Activity: Goal: Ability to avoid complications of mobility impairment will improve Outcome: Progressing   Problem: Clinical Measurements: Goal: Postoperative complications will be avoided or minimized Outcome: Progressing   Problem: Pain Management: Goal: Pain level will decrease with appropriate interventions Outcome: Progressing   

## 2022-08-31 NOTE — Op Note (Addendum)
DATE OF SURGERY:  08/31/2022  TIME: 11:00 AM  PATIENT NAME:  Terri Logan    AGE: 56 y.o.    PRE-OPERATIVE DIAGNOSIS:  Osteoarthritis Left Knee  POST-OPERATIVE DIAGNOSIS:  Osteoarthritis Left Knee  PROCEDURE:  Procedure(s): LEFT TOTAL KNEE ARTHROPLASTY  SURGEON: Aldean Baker  ASSISTANT: dixon  OPERATIVE IMPLANTS: Zimmer Persona  Femur size 8, Tibia size E  2- Peg fixed bearing, Patella size 32 1-peg oval button, with a 10 mm polyethylene insert medial congruent.  @ENCIMAGES @     PREOPERATIVE INDICATIONS:   Terri Logan is a 56 y.o. year old female with end stage degenerative arthritis of the knee who failed conservative treatment and elected for Total Knee Arthroplasty.   The risks, benefits, and alternatives were discussed at length including but not limited to the risks of infection, bleeding, nerve injury, stiffness, blood clots, the need for revision surgery, cardiopulmonary complications, among others, and they were willing to proceed.  OPERATIVE DESCRIPTION:  The patient was brought to the operative room and placed in a supine position.  Anesthesia was administered.  IV antibiotics were given.  IV TXA was initiated.  The lower extremity was prepped and draped in the usual sterile fashion.  58 was used to cover all exposed skin. Time out was performed.    Anterior quadriceps tendon splitting approach was performed.  The patella was everted and osteophytes were removed.  The anterior horn of the medial and lateral meniscus was removed.   The distal femur was opened with the drill and the intramedullary distal femoral cutting jig was utilized, set at 5 degrees valgus resecting 9 mm off the distal femur.  Care was taken to protect the collateral ligaments.  Then the extramedullary tibial cutting jig was utilized set for 3 degree posterior slope.  Care was taken during the cut to protect the medial and collateral ligaments.  The proximal tibia was removed along  with the posterior horns of the menisci.  The PCL was sacrificed.    The extensor gap was measured and was approximately 10 mm.    The distal femoral sizing jig was applied, taking care to avoid notching.  Then the 4-in-1 cutting jig was applied and the anterior and posterior femur was cut, along with the chamfer cuts.  All posterior osteophytes were removed.  The flexion gap was then measured and was symmetric with the extension gap.  The distal femoral preparation using the appropriate jig to prepare the box.  The patella was then measured, and cut with the saw.    The proximal tibia sized and prepared accordingly with the reamer and the punch, and then all components were trialed with the poly insert.  The knee was found to have stable balance and full motion.  The knee was irrigated with normal saline, topical 2 g TXA was used to soak the wound.  The above named components were then press fit into place.  The final polyethylene component was placed.  The knee was then taken through a range of motion and the patella tracked well and the knee irrigated copiously and the parapatellar and subcutaneous tissue closed with vicryl, and skin closed with staples..  A sterile dressing was applied and patient  was taken to the PACU in stable  condition.  There were no complications.  Total tourniquet time was 30 minutes.

## 2022-08-31 NOTE — Inpatient Diabetes Management (Signed)
Inpatient Diabetes Program Recommendations  AACE/ADA: New Consensus Statement on Inpatient Glycemic Control (2015)  Target Ranges:  Prepandial:   less than 140 mg/dL      Peak postprandial:   less than 180 mg/dL (1-2 hours)      Critically ill patients:  140 - 180 mg/dL   Lab Results  Component Value Date   GLUCAP 150 (H) 08/31/2022   HGBA1C 7.7 (H) 08/24/2022    Review of Glycemic Control  Diabetes history: DM2 Outpatient Diabetes medications: Tresiba 50 units BID, metformin 1000 mg BID, Ozempic 2 mg weekly Current orders for Inpatient glycemic control: Novolog 0-20 TID + 6 units TID, metformin 1000 mg BID  HgbA1C - 7.7%  Inpatient Diabetes Program Recommendations:    Consider adding Semglee 25 units BID (half home dose)  F/U with PCP regarding her glycemic control  Continue to follow.  Thank you. Ailene Ards, RD, LDN, CDCES Inpatient Diabetes Coordinator [phone]

## 2022-08-31 NOTE — Anesthesia Procedure Notes (Signed)
Spinal  Patient location during procedure: OR Start time: 08/31/2022 8:30 AM End time: 08/31/2022 8:40 AM Staffing Performed: anesthesiologist  Anesthesiologist: Atilano Median, DO Performed by: Atilano Median, DO Authorized by: Atilano Median, DO   Preanesthetic Checklist Completed: patient identified, IV checked, site marked, risks and benefits discussed, surgical consent, monitors and equipment checked, pre-op evaluation and timeout performed Spinal Block Patient position: sitting Prep: DuraPrep Patient monitoring: heart rate, cardiac monitor, continuous pulse ox and blood pressure Approach: midline Location: L3-4 Injection technique: single-shot Needle Needle type: Pencan and Quincke  Needle gauge: 22 G Needle length: 12.7 cm Additional Notes - Unsuccessful attempt x 2 at multiple levels and needle adjuvants. Proceeded with GA without incident  Patient identified. Risks/Benefits/Options discussed with patient including but not limited to bleeding, infection, nerve damage, paralysis, failed block, incomplete pain control, headache, blood pressure changes, nausea, vomiting, reactions to medications, itching and postpartum back pain. Confirmed with bedside nurse the patient's most recent platelet count. Confirmed with patient that they are not currently taking any anticoagulation, have any bleeding history or any family history of bleeding disorders. Patient expressed understanding and wished to proceed. All questions were answered. Sterile technique was used throughout the entire procedure. Please see nursing notes for vital signs. Warning signs of high block given to the patient including shortness of breath, tingling/numbness in hands, complete motor block, or any concerning symptoms with instructions to call for help. Patient was given instructions on fall risk and not to get out of bed. All questions and concerns addressed with instructions to call with any issues or  inadequate analgesia.

## 2022-08-31 NOTE — H&P (Signed)
TOTAL KNEE ADMISSION H&P  Patient is being admitted for left total knee arthroplasty.  Subjective:  Chief Complaint:left knee pain.  HPI: Terri Logan, 56 y.o. female, has a history of pain and functional disability in the left knee due to arthritis and has failed non-surgical conservative treatments for greater than 12 weeks to includeNSAID's and/or analgesics, corticosteriod injections, use of assistive devices, weight reduction as appropriate, and activity modification.  Onset of symptoms was gradual, starting 8 years ago with gradually worsening course since that time. The patient noted no past surgery on the left knee(s).  Patient currently rates pain in the left knee(s) at 8 out of 10 with activity. Patient has night pain, worsening of pain with activity and weight bearing, pain that interferes with activities of daily living, pain with passive range of motion, crepitus, and joint swelling.  Patient has evidence of subchondral cysts, subchondral sclerosis, periarticular osteophytes, joint subluxation, and joint space narrowing by imaging studies. This patient has had avascular necrosis of the knee. There is no active infection.  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   Unilateral primary osteoarthritis, right knee    Medial meniscus, posterior horn derangement, right    De Quervain's syndrome (tenosynovitis)    DIABETES MELLITUS, UNCONTROLLED 07/22/2009   ANKLE, ARTHRITIS, DEGEN./OSTEO 08/21/2008   ADJUSTMENT DISORDER WITH DEPRESSED MOOD 08/13/2008   ALLERGIC RHINITIS 05/15/2008   LEG PAIN, CHRONIC 10/05/2007   LEUKOCYTOSIS 04/05/2007   SORE THROAT 04/05/2007   SARCOIDOSIS 01/29/2007   OBESITY, MORBID 01/29/2007   OBSTRUCTIVE SLEEP APNEA 01/29/2007   MIGRAINE HEADACHE 01/29/2007   HYPERTENSION 01/29/2007   OSTEOARTHRITIS 01/29/2007   Past Medical History:  Diagnosis Date   Arthritis    Diabetes mellitus without  complication (HCC)    GERD (gastroesophageal reflux disease)    Hypertension    Sarcoidosis    Sleep apnea     Past Surgical History:  Procedure Laterality Date   ABLATION  1993   CHOLECYSTECTOMY     15 years ago [facility]   DORSAL COMPARTMENT RELEASE Left 07/10/2015   Procedure: RELEASE DORSAL COMPARTMENT (DEQUERVAIN);  Surgeon: Vickki Hearing, MD;  Location: [facility];  Service: Orthopedics;  Laterality: Left;   KNEE ARTHROSCOPY WITH MEDIAL MENISECTOMY Right 10/20/2016   Procedure: KNEE ARTHROSCOPY WITH MEDIAL MENISECTOMY;  Surgeon: Vickki Hearing, MD;  Location: [facility];  Service: Orthopedics;  Laterality: Right;   ORIF ANKLE FRACTURE Right    TOTAL KNEE ARTHROPLASTY Right 11/05/2021   Procedure: RIGHT TOTAL KNEE ARTHROPLASTY;  Surgeon: Nadara Mustard, MD;  Location: [facility] OR;  Service: Orthopedics;  Laterality: Right;    Current Facility-Administered Medications  Medication Dose Route Frequency Provider Last Rate Last Admin   ceFAZolin (ANCEF) 2-4 GM/100ML-% IVPB            ceFAZolin (ANCEF) IVPB 3g/100 mL premix  3 g Intravenous On Call to OR Reome, Earle J, RPH       lactated ringers infusion   Intravenous Continuous Lewie Loron, MD       tranexamic acid (CYKLOKAPRON) 1000MG /161mL IVPB            tranexamic acid (CYKLOKAPRON) IVPB 1,000 mg  1,000 mg Intravenous To OR 80m, NP       No Known Allergies  Social History   Tobacco Use   Smoking status: Former    Packs/day: 0.50    Years: 5.00    Total pack years: 2.50  Types: Cigarettes    Quit date: 07/05/1994    Years since quitting: 28.1   Smokeless tobacco: Never  Substance Use Topics   Alcohol use: No    History reviewed. No pertinent family history.   Review of Systems  Objective:  Physical Exam  Vital signs in last 24 hours: Temp:  [98.2 F (36.8 C)] 98.2 F (36.8 C) (09/06 0636) Pulse Rate:  [68] 68 (09/06 0636) Resp:  [17] 17 (09/06 0636) BP: (115)/(58) 115/58 (09/06 0636) SpO2:  [96 %] 96  % (09/06 0636) Weight:  [155.1 kg] 155.1 kg (09/06 0636)  Labs:   Estimated body mass index is 53.56 kg/m as calculated from the following:   Height as of this encounter: 5\' 7"  (1.702 m).   Weight as of this encounter: 155.1 kg.   Imaging Review Plain radiographs demonstrate moderate degenerative joint disease of the left knee(s). The overall alignment ismild varus. The bone quality appears to be adequate for age and reported activity level.      Assessment/Plan:  End stage arthritis, left knee   The patient history, physical examination, clinical judgment of the provider and imaging studies are consistent with end stage degenerative joint disease of the left knee(s) and total knee arthroplasty is deemed medically necessary. The treatment options including medical management, injection therapy arthroscopy and arthroplasty were discussed at length. The risks and benefits of total knee arthroplasty were presented and reviewed. The risks due to aseptic loosening, infection, stiffness, patella tracking problems, thromboembolic complications and other imponderables were discussed. The patient acknowledged the explanation, agreed to proceed with the plan and consent was signed. Patient is being admitted for inpatient treatment for surgery, pain control, PT, OT, prophylactic antibiotics, VTE prophylaxis, progressive ambulation and ADL's and discharge planning. The patient is planning to be discharged home with home health services     Patient's anticipated LOS is less than 2 midnights, meeting these requirements: - Younger than 87 - Lives within 1 hour of care - Has a competent adult at home to recover with post-op recover - NO history of  - Chronic pain requiring opiods  - Diabetes  - Coronary Artery Disease  - Heart failure  - Heart attack  - Stroke  - DVT/VTE  - Cardiac arrhythmia  - Respiratory Failure/COPD  - Renal failure  - Anemia  - Advanced Liver disease

## 2022-08-31 NOTE — Transfer of Care (Signed)
Immediate Anesthesia Transfer of Care Note  Patient: Terri Logan  Procedure(s) Performed: LEFT TOTAL KNEE ARTHROPLASTY (Left: Knee)  Patient Location: PACU  Anesthesia Type:General  Level of Consciousness: drowsy and patient cooperative  Airway & Oxygen Therapy: Patient Spontanous Breathing  Post-op Assessment: Report given to RN and Post -op Vital signs reviewed and stable  Post vital signs: Reviewed and stable  Last Vitals:  Vitals Value Taken Time  BP 133/64 08/31/22 1033  Temp 36.9 C 08/31/22 1033  Pulse 82 08/31/22 1044  Resp 14 08/31/22 1044  SpO2 97 % 08/31/22 1044  Vitals shown include unvalidated device data.  Last Pain:  Vitals:   08/31/22 0701  TempSrc:   PainSc: 4          Complications: No notable events documented.

## 2022-08-31 NOTE — Anesthesia Procedure Notes (Signed)
Procedure Name: Intubation Date/Time: 08/31/2022 8:54 AM  Performed by: Lynnell Chad, CRNAPre-anesthesia Checklist: Patient identified, Emergency Drugs available, Suction available and Patient being monitored Patient Re-evaluated:Patient Re-evaluated prior to induction Oxygen Delivery Method: Circle System Utilized Preoxygenation: Pre-oxygenation with 100% oxygen Induction Type: IV induction Ventilation: Mask ventilation without difficulty Laryngoscope Size: Miller and 2 Grade View: Grade I Tube type: Oral Tube size: 7.0 mm Number of attempts: 1 Airway Equipment and Method: Stylet and Oral airway Placement Confirmation: ETT inserted through vocal cords under direct vision, positive ETCO2 and breath sounds checked- equal and bilateral Secured at: 21 cm Tube secured with: Tape Dental Injury: Teeth and Oropharynx as per pre-operative assessment

## 2022-08-31 NOTE — Progress Notes (Signed)
Called after hours to request order for HS insulin dose- pt's blood sugar is 199. Pt stated that she usually takes a HS dose of sliding scale insulin at home and does not want her blood sugars to get out of control. On call provider called back at 9:48pm and gave VO for additional 4 units of coverage Novolog, will also leave note for Dr. Lajoyce Corners to consider adding HS insulin dose if pt will cont admission past tomorrow per on-call provider's advice.

## 2022-09-01 ENCOUNTER — Encounter (HOSPITAL_COMMUNITY): Payer: Self-pay | Admitting: Orthopedic Surgery

## 2022-09-01 DIAGNOSIS — M1712 Unilateral primary osteoarthritis, left knee: Secondary | ICD-10-CM | POA: Diagnosis not present

## 2022-09-01 LAB — GLUCOSE, CAPILLARY
Glucose-Capillary: 200 mg/dL — ABNORMAL HIGH (ref 70–99)
Glucose-Capillary: 153 mg/dL — ABNORMAL HIGH (ref 70–99)
Glucose-Capillary: 167 mg/dL — ABNORMAL HIGH (ref 70–99)

## 2022-09-01 MED ORDER — OXYCODONE-ACETAMINOPHEN 5-325 MG PO TABS: 1.0000 | ORAL_TABLET | ORAL | 0 refills | Status: DC | PRN

## 2022-09-01 NOTE — Discharge Summary (Signed)
Discharge Diagnoses:  Principal Problem:   DJD (degenerative joint disease) of knee Active Problems:   Total knee replacement status, left   Surgeries: Procedure(s): LEFT TOTAL KNEE ARTHROPLASTY on 08/31/2022    Consultants:   Discharged Condition: Improved  Hospital Course: Terri Logan is an 56 y.o. female who was admitted 08/31/2022 with a chief complaint of osteoarthritis left knee, with a final diagnosis of Osteoarthritis Left Knee.  Patient was brought to the operating room on 08/31/2022 and underwent Procedure(s): LEFT TOTAL KNEE ARTHROPLASTY.    Patient was given perioperative antibiotics:  Anti-infectives (From admission, onward)    Start     Dose/Rate Route Frequency Ordered Stop   08/31/22 1245  ceFAZolin (ANCEF) IVPB 2g/100 mL premix        2 g 200 mL/hr over 30 Minutes Intravenous Every 6 hours 08/31/22 1238 08/31/22 2256   08/31/22 0745  ceFAZolin (ANCEF) IVPB 3g/100 mL premix        3 g 200 mL/hr over 30 Minutes Intravenous On call to O.R. 08/31/22 4665 08/31/22 0905   08/31/22 0709  ceFAZolin (ANCEF) 3-0.9 GM/100ML-% IVPB       Note to Pharmacy: Samuella Cota O: cabinet override      08/31/22 0709 08/31/22 1914   08/31/22 0655  ceFAZolin (ANCEF) 2-4 GM/100ML-% IVPB  Status:  Discontinued       Note to Pharmacy: Samuella Cota O: cabinet override      08/31/22 0655 08/31/22 0711     .  Patient was given sequential compression devices, early ambulation, and aspirin for DVT prophylaxis.  Recent vital signs: Patient Vitals for the past 24 hrs:  BP Temp Temp src Pulse Resp SpO2  09/01/22 1403 133/66 97.9 F (36.6 C) Oral 80 18 97 %  09/01/22 0725 132/70 98.1 F (36.7 C) Oral 80 17 96 %  08/31/22 2028 137/71 98.1 F (36.7 C) Oral 95 16 99 %  .  Recent laboratory studies: No results found.  Discharge Medications:   Allergies as of 09/01/2022   No Known Allergies      Medication List     STOP taking these medications    HYDROcodone-acetaminophen  10-325 MG tablet Commonly known as: NORCO       TAKE these medications    albuterol 108 (90 Base) MCG/ACT inhaler Commonly known as: VENTOLIN HFA Inhale 1-2 puffs into the lungs every 4 (four) hours as needed for wheezing or shortness of breath.   amLODipine 10 MG tablet Commonly known as: NORVASC Take 10 mg by mouth every morning.   ASPERCREME MAX ROLL-ON EX Apply 1 application topically daily as needed (pain).   aspirin EC 81 MG tablet Take 81 mg by mouth daily. Swallow whole.   aspirin EC 325 MG tablet Take 1 tablet (325 mg total) by mouth daily.   atorvastatin 10 MG tablet Commonly known as: LIPITOR Take 10 mg by mouth daily.   cholecalciferol 1000 units tablet Commonly known as: VITAMIN D Take 1,000 Units by mouth daily.   cyclobenzaprine 10 MG tablet Commonly known as: FLEXERIL Take 10 mg by mouth 3 (three) times daily.   escitalopram 10 MG tablet Commonly known as: LEXAPRO Take 10 mg by mouth daily.   gabapentin 300 MG capsule Commonly known as: NEURONTIN Take 300 mg by mouth 3 (three) times daily.   hydrochlorothiazide 25 MG tablet Commonly known as: HYDRODIURIL Take 25 mg by mouth daily.   irbesartan 150 MG tablet Commonly known as: AVAPRO Take 150 mg by mouth  daily.   metFORMIN 1000 MG tablet Commonly known as: GLUCOPHAGE Take 1,000 mg by mouth 2 (two) times daily with a meal.   methocarbamol 500 MG tablet Commonly known as: Robaxin Take 1 tablet (500 mg total) by mouth 3 (three) times daily.   multivitamin with minerals Tabs tablet Take 1 tablet by mouth daily.   NON FORMULARY Pt uses a cpap nightly   oxyCODONE-acetaminophen 5-325 MG tablet Commonly known as: PERCOCET/ROXICET Take 1 tablet by mouth every 4 (four) hours as needed.   Ozempic (2 MG/DOSE) 8 MG/3ML Sopn Generic drug: Semaglutide (2 MG/DOSE) Inject 2 mg into the skin every Saturday.   spironolactone 50 MG tablet Commonly known as: ALDACTONE Take 50 mg by mouth daily.    Evaristo Bury FlexTouch 200 UNIT/ML FlexTouch Pen Generic drug: insulin degludec Inject 50 Units into the skin 2 (two) times daily.   zolpidem 5 MG tablet Commonly known as: Ambien Take 1 tablet (5 mg total) by mouth at bedtime as needed for sleep.               Discharge Care Instructions  (From admission, onward)           Start     Ordered   09/01/22 0000  Weight bearing as tolerated       Question Answer Comment  Laterality left   Extremity Lower      09/01/22 0823            Diagnostic Studies: No results found.  Patient benefited maximally from their hospital stay and there were no complications.     Disposition: Discharge disposition: 01-Home or Self Care      Discharge Instructions     Call MD / Call 911   Complete by: As directed    If you experience chest pain or shortness of breath, CALL 911 and be transported to the hospital emergency room.  If you develope a fever above 101 F, pus (white drainage) or increased drainage or redness at the wound, or calf pain, call your surgeon's office.   Constipation Prevention   Complete by: As directed    Drink plenty of fluids.  Prune juice may be helpful.  You may use a stool softener, such as Colace (over the counter) 100 mg twice a day.  Use MiraLax (over the counter) for constipation as needed.   Diet - low sodium heart healthy   Complete by: As directed    Increase activity slowly as tolerated   Complete by: As directed    Post-operative opioid taper instructions:   Complete by: As directed    POST-OPERATIVE OPIOID TAPER INSTRUCTIONS: It is important to wean off of your opioid medication as soon as possible. If you do not need pain medication after your surgery it is ok to stop day one. Opioids include: Codeine, Hydrocodone(Norco, Vicodin), Oxycodone(Percocet, oxycontin) and hydromorphone amongst others.  Long term and even short term use of opiods can cause: Increased pain  response Dependence Constipation Depression Respiratory depression And more.  Withdrawal symptoms can include Flu like symptoms Nausea, vomiting And more Techniques to manage these symptoms Hydrate well Eat regular healthy meals Stay active Use relaxation techniques(deep breathing, meditating, yoga) Do Not substitute Alcohol to help with tapering If you have been on opioids for less than two weeks and do not have pain than it is ok to stop all together.  Plan to wean off of opioids This plan should start within one week post op of your joint replacement. Maintain  the same interval or time between taking each dose and first decrease the dose.  Cut the total daily intake of opioids by one tablet each day Next start to increase the time between doses. The last dose that should be eliminated is the evening dose.      Weight bearing as tolerated   Complete by: As directed    Laterality: left   Extremity: Lower       Follow-up Information     Nadara Mustard, MD Follow up in 1 week(s).   Specialty: Orthopedic Surgery Contact information: [address] [place] [address] [phone]         Roe Rutherford, NP Follow up.   Specialty: Adult Health Nurse Practitioner Contact information: [address] [place] [address] [phone]                  Signed: Nadara Mustard 09/01/2022, 4:41 PM

## 2022-09-01 NOTE — Plan of Care (Signed)
  Problem: Education: Goal: Ability to describe self-care measures that may prevent or decrease complications (Diabetes Survival Skills Education) will improve Outcome: Progressing   Problem: Coping: Goal: Ability to adjust to condition or change in health will improve Outcome: Progressing   Problem: Metabolic: Goal: Ability to maintain appropriate glucose levels will improve Outcome: Progressing   Problem: Nutritional: Goal: Maintenance of adequate nutrition will improve Outcome: Progressing   Problem: Skin Integrity: Goal: Risk for impaired skin integrity will decrease Outcome: Progressing   Problem: Education: Goal: Knowledge of the prescribed therapeutic regimen will improve Outcome: Progressing   Problem: Activity: Goal: Ability to avoid complications of mobility impairment will improve Outcome: Progressing   Problem: Pain Management: Goal: Pain level will decrease with appropriate interventions Outcome: Progressing   Problem: Skin Integrity: Goal: Will show signs of wound healing Outcome: Progressing

## 2022-09-01 NOTE — Inpatient Diabetes Management (Signed)
Inpatient Diabetes Program Recommendations  AACE/ADA: New Consensus Statement on Inpatient Glycemic Control (2015)  Target Ranges:  Prepandial:   less than 140 mg/dL      Peak postprandial:   less than 180 mg/dL (1-2 hours)      Critically ill patients:  140 - 180 mg/dL   Lab Results  Component Value Date   GLUCAP 200 (H) 09/01/2022   HGBA1C 7.7 (H) 08/24/2022    Review of Glycemic Control  Latest Reference Range & Units 08/31/22 16:18 08/31/22 20:42 09/01/22 07:22  Glucose-Capillary 70 - 99 mg/dL 244 (H) 975 (H) 300 (H)  (H): Data is abnormally high Diabetes history: DM2 Outpatient Diabetes medications: Tresiba 50 units BID, metformin 1000 mg BID, Ozempic 2 mg weekly Current orders for Inpatient glycemic control: Novolog 0-20 TID + 6 units TID, metformin 1000 mg BID   HgbA1C - 7.7%   Inpatient Diabetes Program Recommendations:     Consider adding Semglee 25 units BID (half home dose)   F/U with PCP regarding her glycemic control  Thanks, Lujean Rave, MSN, RNC-OB Diabetes Coordinator [phone] (8a-5p)

## 2022-09-01 NOTE — Progress Notes (Signed)
Physical Therapy Treatment Patient Details Name: Terri Logan MRN: [id number] DOB: 02/20/1966 Today's Date: 09/01/2022   History of Present Illness Pt is a 56 y/o female s/p L TKA. PMH includes R TKA, HTN, and DM.    PT Comments    Pt admitted with above diagnosis. Pt was able to practice steps with husband present and min guard assist overall.  They feel confident with up and down steps.  Second sesion will focus on exercise and ambulation.  Pt currently with functional limitations due to balance and endurance deficits. Pt will benefit from skilled PT to increase their independence and safety with mobility to allow discharge to the venue listed below.      Recommendations for follow up therapy are one component of a multi-disciplinary discharge planning process, led by the attending physician.  Recommendations may be updated based on patient status, additional functional criteria and insurance authorization.  Follow Up Recommendations  Follow physician's recommendations for discharge plan and follow up therapies     Assistance Recommended at Discharge Intermittent Supervision/Assistance  Patient can return home with the following A little help with walking and/or transfers;A little help with bathing/dressing/bathroom;Assistance with cooking/housework;Help with stairs or ramp for entrance;Assist for transportation   Equipment Recommendations  None recommended by PT    Recommendations for Other Services       Precautions / Restrictions Precautions Precautions: Knee Precaution Booklet Issued: No Precaution Comments: Verbally reviewed knee precautions. Restrictions Weight Bearing Restrictions: Yes LLE Weight Bearing: Weight bearing as tolerated     Mobility  Bed Mobility               General bed mobility comments: In chair on arrival    Transfers Overall transfer level: Needs assistance Equipment used: Rolling walker (2 wheels) Transfers: Sit to/from Stand Sit  to Stand: Min guard Stand pivot transfers: Min guard         General transfer comment: Min A guard steadying to stand. Min guard for safety. Cues for hand placement.    Ambulation/Gait Ambulation/Gait assistance: Min guard Gait Distance (Feet): 15 Feet Assistive device: Rolling walker (2 wheels) Gait Pattern/deviations: Step-to pattern, Decreased step length - left, Decreased dorsiflexion - left, Antalgic, Decreased stance time - left   Gait velocity interpretation: <1.31 ft/sec, indicative of household ambulator   General Gait Details: Pt does well overall with sequencing steps and RW. No LOB as well.   Stairs Stairs: Yes Stairs assistance: Min assist, Min guard Stair Management: One rail Left, Step to pattern, Forwards, With walker Number of Stairs: 3 General stair comments: Pt uses rail on left and walker on right with husbands assist and cues.  No LOB. Cues for technqiue.   Wheelchair Mobility    Modified Rankin (Stroke Patients Only)       Balance Overall balance assessment: Needs assistance Sitting-balance support: No upper extremity supported, Feet supported Sitting balance-Leahy Scale: Good     Standing balance support: Bilateral upper extremity supported, During functional activity, No upper extremity supported Standing balance-Leahy Scale: Poor Standing balance comment: relies on UE support for balance with RW                            Cognition Arousal/Alertness: Awake/alert Behavior During Therapy: WFL for tasks assessed/performed Overall Cognitive Status: Within Functional Limits for tasks assessed  Exercises General Exercises - Lower Extremity Ankle Circles/Pumps: AROM, Both, 5 reps, Supine Quad Sets: AROM, Both, 5 reps, Supine    General Comments General comments (skin integrity, edema, etc.): Pt's husband present      Pertinent Vitals/Pain Pain Assessment Pain  Assessment: Faces Pain Score: 6  Faces Pain Scale: Hurts even more Breathing: normal Pain Location: L knee Pain Descriptors / Indicators: Grimacing, Guarding Pain Intervention(s): Limited activity within patient's tolerance, Monitored during session, Repositioned, Patient requesting pain meds-RN notified, Ice applied    Home Living                          Prior Function            PT Goals (current goals can now be found in the care plan section) Acute Rehab PT Goals Patient Stated Goal: to go home Progress towards PT goals: Progressing toward goals    Frequency    7X/week      PT Plan Current plan remains appropriate    Co-evaluation              AM-PAC PT "6 Clicks" Mobility   Outcome Measure  Help needed turning from your back to your side while in a flat bed without using bedrails?: None Help needed moving from lying on your back to sitting on the side of a flat bed without using bedrails?: A Little Help needed moving to and from a bed to a chair (including a wheelchair)?: A Little Help needed standing up from a chair using your arms (e.g., wheelchair or bedside chair)?: A Little Help needed to walk in hospital room?: A Little Help needed climbing 3-5 steps with a railing? : A Little 6 Click Score: 19    End of Session Equipment Utilized During Treatment: Gait belt Activity Tolerance: Patient limited by pain Patient left: in chair;with call bell/phone within reach;with family/visitor present Nurse Communication: Mobility status;Patient requests pain meds PT Visit Diagnosis: Other abnormalities of gait and mobility (R26.89);Difficulty in walking, not elsewhere classified (R26.2);Pain Pain - Right/Left: Left Pain - part of body: Knee     Time: 1128-1204 PT Time Calculation (min) (ACUTE ONLY): 36 min  Charges:  $Gait Training: 8-22 mins $Therapeutic Exercise: 8-22 mins                     Terri Logan,PT Acute Rehab Services [phone]     Terri Logan 09/01/2022, 2:14 PM

## 2022-09-01 NOTE — Progress Notes (Signed)
09/01/22 1416  PT Visit Information  Last PT Received On 09/01/22  Assistance Needed +1  History of Present Illness Pt is a 56 y/o female s/p L TKA. PMH includes R TKA, HTN, and DM.  Subjective Data  Patient Stated Goal to go home  Precautions  Precautions Knee  Precaution Booklet Issued No  Precaution Comments Verbally reviewed knee precautions.  Restrictions  Weight Bearing Restrictions Yes  LLE Weight Bearing WBAT  Pain Assessment  Pain Assessment Faces  Faces Pain Scale 6  Breathing 0  Pain Location L knee  Pain Descriptors / Indicators Grimacing;Guarding  Pain Intervention(s) Limited activity within patient's tolerance;Monitored during session;Repositioned;Ice applied  Cognition  Arousal/Alertness Awake/alert  Behavior During Therapy WFL for tasks assessed/performed  Overall Cognitive Status Within Functional Limits for tasks assessed  Bed Mobility  Overal bed mobility Needs Assistance  Bed Mobility Sit to Supine  Sit to supine Supervision  General bed mobility comments Pt used gait belt to assist left LE into bed and did not need assist by therapist.  Transfers  Overall transfer level Needs assistance  Equipment used Rolling walker (2 wheels)  Transfers Sit to/from Stand  Sit to Stand Min guard  Stand pivot transfers Min guard  General transfer comment Min A guard steadying to stand. Min guard for safety. Cues for hand placement.  Ambulation/Gait  Ambulation/Gait assistance Min guard  Gait Distance (Feet) 110 Feet  Assistive device Rolling walker (2 wheels)  Gait Pattern/deviations Step-to pattern;Decreased step length - left;Decreased dorsiflexion - left;Antalgic;Decreased stance time - left  General Gait Details Pt does well overall with sequencing steps and RW. No LOB as well.  Progressed distance.  Gait velocity interpretation <1.31 ft/sec, indicative of household ambulator  Balance  Overall balance assessment Needs assistance  Sitting-balance support No  upper extremity supported;Feet supported  Sitting balance-Leahy Scale Good  Standing balance support Bilateral upper extremity supported;During functional activity;No upper extremity supported  Standing balance-Leahy Scale Poor  Standing balance comment relies on UE support for balance with RW  General Comments  General comments (skin integrity, edema, etc.) Pt's husband present  Exercises  Exercises Total Joint  Total Joint Exercises  [address];Left;10 reps;Seated  Heel Slides AAROM;Both;10 reps;Supine  Towel Squeeze AROM;Both;10 reps;Supine  Straight Leg Raises AAROM;Left;10 reps;Supine  Hip ABduction/ADduction AAROM;Both;10 reps;Supine  Knee Flexion AAROM;Left;5 reps;Seated  Goniometric ROM 8-45 degrees  General Exercises - Lower Extremity  Ankle Circles/Pumps AROM;Both;5 reps;Supine  Quad Sets AROM;Both;5 reps;Supine  PT - End of Session  Equipment Utilized During Treatment Gait belt  Activity Tolerance Patient limited by pain  Patient left with call bell/phone within reach;with family/visitor present;in bed  Nurse Communication Mobility status   PT - Assessment/Plan  PT Plan Current plan remains appropriate  PT Visit Diagnosis Other abnormalities of gait and mobility (R26.89);Difficulty in walking, not elsewhere classified (R26.2);Pain  Pain - Right/Left Left  Pain - part of body Knee  PT Frequency (ACUTE ONLY) 7X/week  Follow Up Recommendations Follow physician's recommendations for discharge plan and follow up therapies  Assistance recommended at discharge Intermittent Supervision/Assistance  Patient can return home with the following A little help with walking and/or transfers;A little help with bathing/dressing/bathroom;Assistance with cooking/housework;Help with stairs or ramp for entrance;Assist for transportation  PT equipment None recommended by PT  AM-PAC PT "6 Clicks" Mobility Outcome Measure (Version 2)  Help needed turning from your back to your side  while in a flat bed without using bedrails? 4  Help needed moving from lying on  your back to sitting on the side of a flat bed without using bedrails? 3  Help needed moving to and from a bed to a chair (including a wheelchair)? 3  Help needed standing up from a chair using your arms (e.g., wheelchair or bedside chair)? 3  Help needed to walk in hospital room? 3  Help needed climbing 3-5 steps with a railing?  3  6 Click Score 19  Consider Recommendation of Discharge To: Home with [facility]  Progressive Mobility  What is the highest level of mobility based on the progressive mobility assessment? Level 5 (Walks with assist in room/hall) - Balance while stepping forward/back and can walk in room with assist - Complete  Activity Ambulated with assistance in hallway  PT Goal Progression  Progress towards PT goals Progressing toward goals  PT Time Calculation  PT Start Time (ACUTE ONLY) 1328  PT Stop Time (ACUTE ONLY) 1357  PT Time Calculation (min) (ACUTE ONLY) 29 min  PT General Charges  $$ ACUTE PT VISIT 1 Visit  PT Treatments  $Gait Training 8-22 mins  $Therapeutic Exercise 8-22 mins  Pt education complete.  Pt and husband feel comfortable with going home and assuming care there.  Husband demonstrates he can assist pt as needed.  Most likely will d/c today.  Uriah Philipson M,PT Acute Rehab Services [phone]

## 2022-09-01 NOTE — Plan of Care (Signed)
  Problem: Metabolic: Goal: Ability to maintain appropriate glucose levels will improve Outcome: Progressing   Problem: Activity: Goal: Ability to avoid complications of mobility impairment will improve Outcome: Progressing   Problem: Pain Management: Goal: Pain level will decrease with appropriate interventions Outcome: Progressing   Problem: Education: Goal: Knowledge of General Education information will improve Description: Including pain rating scale, medication(s)/side effects and non-pharmacologic comfort measures Outcome: Progressing   Problem: Health Behavior/Discharge Planning: Goal: Ability to manage health-related needs will improve Outcome: Progressing   Problem: Pain Managment: Goal: General experience of comfort will improve Outcome: Progressing

## 2022-09-01 NOTE — Evaluation (Signed)
Occupational Therapy Evaluation Patient Details Name: Terri Logan MRN: [id number] DOB: [birth year] Today's Date: 09/01/2022   History of Present Illness Pt is a 56 y/o female s/p L TKA. PMH includes R TKA, HTN, and DM.   Clinical Impression   Pt at PLOF reported they were active prior to sx going to the gym twice a week and lives with husband. Pt at this time requires set up for UE ADLS in a sitting position and max assist with LE ADLS at this time. Pt and husband was educated on AE on how to increase in independence in LE ADSLs and voiced an understanding. Pt currently with functional limitations due to the deficits listed below (see OT Problem List).  Pt will benefit from skilled OT to increase their safety and independence with ADL and functional mobility for ADL to facilitate discharge to venue listed below.        Recommendations for follow up therapy are one component of a multi-disciplinary discharge planning process, led by the attending physician.  Recommendations may be updated based on patient status, additional functional criteria and insurance authorization.   Follow Up Recommendations  Follow physician's recommendations for discharge plan and follow up therapies    Assistance Recommended at Discharge Intermittent Supervision/Assistance  Patient can return home with the following A little help with walking and/or transfers;A lot of help with bathing/dressing/bathroom;Assistance with cooking/housework;Assist for transportation    Functional Status Assessment  Patient has had a recent decline in their functional status and demonstrates the ability to make significant improvements in function in a reasonable and predictable amount of time.  Equipment Recommendations  None recommended by OT    Recommendations for Other Services       Precautions / Restrictions Precautions Precautions: Knee Precaution Booklet Issued: No Restrictions Weight Bearing Restrictions:  Yes LLE Weight Bearing: Weight bearing as tolerated      Mobility Bed Mobility Overal bed mobility: Needs Assistance Bed Mobility: Supine to Sit     Supine to sit: Min assist     General bed mobility comments: Assist for LLE management    Transfers Overall transfer level: Needs assistance Equipment used: Rolling walker (2 wheels) Transfers: Sit to/from Stand Sit to Stand: Min guard                  Balance Overall balance assessment: Needs assistance Sitting-balance support: No upper extremity supported, Feet supported Sitting balance-Leahy Scale: Good     Standing balance support: Bilateral upper extremity supported, During functional activity, No upper extremity supported (Pt was able to complete hygiene in standing) Standing balance-Leahy Scale: Fair                             ADL either performed or assessed with clinical judgement   ADL Overall ADL's : Needs assistance/impaired Eating/Feeding: Independent;Sitting   Grooming: Wash/dry hands;Wash/dry face;Oral care;Set up;Sitting   Upper Body Bathing: Set up;Sitting   Lower Body Bathing: Maximal assistance;Total assistance;Cueing for safety;Cueing for sequencing;Sit to/from stand;Sitting/lateral leans   Upper Body Dressing : Set up;Sitting   Lower Body Dressing: Maximal assistance;Total assistance;Sit to/from stand;Sitting/lateral leans   Toilet Transfer: Min guard;Cueing for safety;Cueing for sequencing;Rolling walker (2 wheels);BSC/3in1   Toileting- Clothing Manipulation and Hygiene: Minimal assistance;Cueing for safety;Cueing for sequencing;Sit to/from stand       Functional mobility during ADLs: Min guard;Rolling walker (2 wheels)       Vision  Perception     Praxis      Pertinent Vitals/Pain Pain Assessment Pain Assessment: 0-10 Pain Score: 4  Breathing: normal Pain Location: L knee Pain Descriptors / Indicators: Grimacing, Guarding Pain Intervention(s):  Premedicated before session, Monitored during session, Limited activity within patient's tolerance, Repositioned     Hand Dominance Right   Extremity/Trunk Assessment Upper Extremity Assessment Upper Extremity Assessment: Overall WFL for tasks assessed   Lower Extremity Assessment Lower Extremity Assessment: Defer to PT evaluation       Communication Communication Communication: No difficulties   Cognition Arousal/Alertness: Awake/alert Behavior During Therapy: WFL for tasks assessed/performed Overall Cognitive Status: Within Functional Limits for tasks assessed                                       General Comments       Exercises     Shoulder Instructions      Home Living Family/patient expects to be discharged to:: Private residence Living Arrangements: Spouse/significant other Available Help at Discharge: Family;Available 24 hours/day Type of Home: House Home Access: Stairs to enter [employer] of Steps: 4 Entrance Stairs-Rails: Right;Left Home Layout: One level     Bathroom Shower/Tub: Producer, television/film/video: Standard     Home Equipment: Agricultural consultant (2 wheels);Rollator (4 wheels);BSC/3in1;Shower seat   Additional Comments: spouse is with patient 24/7      Prior Functioning/Environment Prior Level of Function : Independent/Modified Independent             Mobility Comments: Used cane for ambulation ADLs Comments: hsas 3 grandchildren they watch regualarly and visit        OT Problem List: Decreased strength;Decreased activity tolerance;Impaired balance (sitting and/or standing);Decreased safety awareness;Decreased knowledge of use of DME or AE;Cardiopulmonary status limiting activity;Pain      OT Treatment/Interventions: Self-care/ADL training;Therapeutic exercise;DME and/or AE instruction;Therapeutic activities;Patient/family education;Balance training    OT Goals(Current goals can be found in the care  plan section) Acute Rehab OT Goals Patient Stated Goal: to get home today OT Goal Formulation: With patient Time For Goal Achievement: 09/15/22 Potential to Achieve Goals: Good ADL Goals Pt Will Perform Grooming: with set-up;standing Pt Will Perform Lower Body Bathing: with mod assist;sit to/from stand Pt Will Perform Lower Body Dressing: with mod assist;sit to/from stand;with adaptive equipment Pt Will Transfer to Toilet: with modified independence;ambulating;grab bars  OT Frequency: Min 2X/week    Co-evaluation              AM-PAC OT "6 Clicks" Daily Activity     Outcome Measure Help from another person eating meals?: None Help from another person taking care of personal grooming?: None Help from another person toileting, which includes using toliet, bedpan, or urinal?: A Little Help from another person bathing (including washing, rinsing, drying)?: A Lot Help from another person to put on and taking off regular upper body clothing?: A Little Help from another person to put on and taking off regular lower body clothing?: A Lot 6 Click Score: 18   End of Session Equipment Utilized During Treatment: Gait belt;Rolling walker (2 wheels) Nurse Communication: Mobility status  Activity Tolerance: Patient tolerated treatment well Patient left: in chair;with call bell/phone within reach;with family/visitor present  OT Visit Diagnosis: Unsteadiness on feet (R26.81);Other abnormalities of gait and mobility (R26.89);Muscle weakness (generalized) (M62.81);Pain Pain - Right/Left: Left Pain - part of body: Knee  [id number] OT Time Calculation (min): 43 min Charges:  OT General Charges $OT Visit: 1 Visit OT Evaluation $OT Eval Low Complexity: 1 Low OT Treatments $Self Care/Home Management : 23-37 mins  Joeseph Amor OTR/L  Acute Rehab Services  [phone] office number [phone] pager number   Joeseph Amor 09/01/2022, 9:29 AM

## 2022-09-01 NOTE — Progress Notes (Signed)
Patient ID: Terri Logan, female   DOB: [birth year], 57 y.o.   MRN: [id number] Patient is postoperative day 1 left total knee arthroplasty.  Patient states she feels much better than she did after her last knee replacement.  She states she has been able to ambulate.  She has 2 physical therapy sessions today and plan to discharge this afternoon.

## 2022-09-03 ENCOUNTER — Other Ambulatory Visit: Payer: Self-pay | Admitting: Orthopedic Surgery

## 2022-09-05 MED ORDER — METHOCARBAMOL 500 MG PO TABS
500.0000 mg | ORAL_TABLET | Freq: Three times a day (TID) | ORAL | 0 refills | Status: DC
Start: 2022-09-05 — End: 2022-09-20

## 2022-09-06 ENCOUNTER — Other Ambulatory Visit (INDEPENDENT_AMBULATORY_CARE_PROVIDER_SITE_OTHER): Payer: Self-pay | Admitting: Orthopedic Surgery

## 2022-09-07 ENCOUNTER — Encounter: Payer: Self-pay | Admitting: Family

## 2022-09-07 ENCOUNTER — Ambulatory Visit (INDEPENDENT_AMBULATORY_CARE_PROVIDER_SITE_OTHER): Payer: Medicare Other | Admitting: Family

## 2022-09-07 DIAGNOSIS — M1712 Unilateral primary osteoarthritis, left knee: Secondary | ICD-10-CM

## 2022-09-07 MED ORDER — OXYCODONE-ACETAMINOPHEN 5-325 MG PO TABS: 1.0000 | ORAL_TABLET | ORAL | 0 refills | Status: DC | PRN

## 2022-09-07 NOTE — Progress Notes (Signed)
Post-Op Visit Note   Patient: Terri Logan           Date of Birth: 05/26/1966           MRN: [id number] Visit Date: 09/07/2022 PCP: Roe Rutherford, NP  Chief Complaint:  Chief Complaint  Patient presents with   Left Knee - Routine Post Op    08/31/2022 left total knee replacement     HPI:  HPI The patient is a 56 year old woman who is seen status post left total knee arthroplasty on September 6. She is at home she has not yet begun her home health physical therapy has not been contacted. Ortho Exam On examination of the left knee her incision is well approximated with staples there is no gaping drainage she has moderate edema of her knee she lacks 10 degrees of full extension  Visit Diagnoses: No diagnosis found.  Plan: Begin daily Dial soap cleansing.  Foam dressing applied today.  We will work on aggressive physical therapy.  Radiographs at follow up  Follow-Up Instructions: No follow-ups on file.   Imaging: No results found.  Orders:  No orders of the defined types were placed in this encounter.  No orders of the defined types were placed in this encounter.    PMFS History: Patient Active Problem List   Diagnosis Date Noted   DJD (degenerative joint disease) of knee 08/31/2022   Total knee replacement status, left 08/31/2022   Total knee replacement status, right 11/05/2021   Unilateral primary osteoarthritis, right knee    Medial meniscus, posterior horn derangement, right    De Quervain's syndrome (tenosynovitis)    DIABETES MELLITUS, UNCONTROLLED 07/22/2009   ANKLE, ARTHRITIS, DEGEN./OSTEO 08/21/2008   ADJUSTMENT DISORDER WITH DEPRESSED MOOD 08/13/2008   ALLERGIC RHINITIS 05/15/2008   LEG PAIN, CHRONIC 10/05/2007   LEUKOCYTOSIS 04/05/2007   SORE THROAT 04/05/2007   SARCOIDOSIS 01/29/2007   OBESITY, MORBID 01/29/2007   OBSTRUCTIVE SLEEP APNEA 01/29/2007   MIGRAINE HEADACHE 01/29/2007   HYPERTENSION 01/29/2007   OSTEOARTHRITIS 01/29/2007    Past Medical History:  Diagnosis Date   Arthritis    Diabetes mellitus without complication (HCC)    GERD (gastroesophageal reflux disease)    Hypertension    Sarcoidosis    Sleep apnea     No family history on file.  Past Surgical History:  Procedure Laterality Date   ABLATION  1993   CHOLECYSTECTOMY     15 years ago [facility]   DORSAL COMPARTMENT RELEASE Left 07/10/2015   Procedure: RELEASE DORSAL COMPARTMENT (DEQUERVAIN);  Surgeon: Vickki Hearing, MD;  Location: [facility];  Service: Orthopedics;  Laterality: Left;   KNEE ARTHROSCOPY WITH MEDIAL MENISECTOMY Right 10/20/2016   Procedure: KNEE ARTHROSCOPY WITH MEDIAL MENISECTOMY;  Surgeon: Vickki Hearing, MD;  Location: [facility];  Service: Orthopedics;  Laterality: Right;   ORIF ANKLE FRACTURE Right    TOTAL KNEE ARTHROPLASTY Right 11/05/2021   Procedure: RIGHT TOTAL KNEE ARTHROPLASTY;  Surgeon: Nadara Mustard, MD;  Location: [facility] OR;  Service: Orthopedics;  Laterality: Right;   TOTAL KNEE ARTHROPLASTY Left 08/31/2022   Procedure: LEFT TOTAL KNEE ARTHROPLASTY;  Surgeon: Nadara Mustard, MD;  Location: [facility];  Service: Orthopedics;  Laterality: Left;   Social History   Occupational History   Not on file  Tobacco Use   Smoking status: Former    Packs/day: 0.50    Years: 5.00    Total pack years: 2.50    Types: Cigarettes    Quit date: 07/05/1994  Years since quitting: 28.1   Smokeless tobacco: Never  Vaping Use   Vaping Use: Never used  Substance and Sexual Activity   Alcohol use: No   Drug use: No    Types: Other-see comments    Comment: delta 9 gummies every night for sleep   Sexual activity: Yes    Birth control/protection: Surgical

## 2022-09-13 ENCOUNTER — Other Ambulatory Visit: Payer: Self-pay | Admitting: Family

## 2022-09-13 MED ORDER — OXYCODONE-ACETAMINOPHEN 5-325 MG PO TABS: 1.0000 | ORAL_TABLET | Freq: Four times a day (QID) | ORAL | 0 refills | Status: DC | PRN

## 2022-09-14 ENCOUNTER — Ambulatory Visit (INDEPENDENT_AMBULATORY_CARE_PROVIDER_SITE_OTHER): Payer: Medicare Other | Admitting: Family

## 2022-09-14 ENCOUNTER — Encounter: Payer: Self-pay | Admitting: Family

## 2022-09-14 ENCOUNTER — Ambulatory Visit: Payer: Self-pay

## 2022-09-14 DIAGNOSIS — M1712 Unilateral primary osteoarthritis, left knee: Secondary | ICD-10-CM

## 2022-09-14 NOTE — Progress Notes (Signed)
Post-Op Visit Note   Patient: Terri Logan           Date of Birth: [birth year]           MRN: [id number] Visit Date: 09/14/2022 PCP: Pablo Lawrence, NP  Chief Complaint: No chief complaint on file.   HPI:  HPI The patient is a 56 year old woman seen 2 week status post left total knee arthroplasty. No new concerns.   Has been doing HHPT.   Left Knee Exam   Tests  Varus: negative Valgus: negative     On examination of the left knee the incision is approximated with staples is healing well staples harvested today without incident there is no erythema no gaping no drainage she does have flexion to 80 degrees lacks about 10 degrees full extension.   Visit Diagnoses: No diagnosis found.  Plan: Order placed for outpatient physical therapy.  She will follow-up in office in 4 weeks.  Staples harvested today.  Follow-Up Instructions: Return in about 4 weeks (around 10/12/2022).   Imaging: No results found.  Orders:  No orders of the defined types were placed in this encounter.  No orders of the defined types were placed in this encounter.    PMFS History: Patient Active Problem List   Diagnosis Date Noted   DJD (degenerative joint disease) of knee 08/31/2022   Total knee replacement status, left 08/31/2022   Total knee replacement status, right 11/05/2021   Unilateral primary osteoarthritis, right knee    Medial meniscus, posterior horn derangement, right    De Quervain's syndrome (tenosynovitis)    DIABETES MELLITUS, UNCONTROLLED 07/22/2009   ANKLE, ARTHRITIS, DEGEN./OSTEO 08/21/2008   ADJUSTMENT DISORDER WITH DEPRESSED MOOD 08/13/2008   ALLERGIC RHINITIS 05/15/2008   LEG PAIN, CHRONIC 10/05/2007   LEUKOCYTOSIS 04/05/2007   SORE THROAT 04/05/2007   SARCOIDOSIS 01/29/2007   OBESITY, MORBID 01/29/2007   OBSTRUCTIVE SLEEP APNEA 01/29/2007   MIGRAINE HEADACHE 01/29/2007   HYPERTENSION 01/29/2007   OSTEOARTHRITIS 01/29/2007   Past Medical History:   Diagnosis Date   Arthritis    Diabetes mellitus without complication (HCC)    GERD (gastroesophageal reflux disease)    Hypertension    Sarcoidosis    Sleep apnea     History reviewed. No pertinent family history.  Past Surgical History:  Procedure Laterality Date   ABLATION  1993   CHOLECYSTECTOMY     15 years ago [facility]   DORSAL COMPARTMENT RELEASE Left 07/10/2015   Procedure: RELEASE DORSAL COMPARTMENT (DEQUERVAIN);  Surgeon: Carole Civil, MD;  Location: [facility];  Service: Orthopedics;  Laterality: Left;   KNEE ARTHROSCOPY WITH MEDIAL MENISECTOMY Right 10/20/2016   Procedure: KNEE ARTHROSCOPY WITH MEDIAL MENISECTOMY;  Surgeon: Carole Civil, MD;  Location: [facility];  Service: Orthopedics;  Laterality: Right;   ORIF ANKLE FRACTURE Right    TOTAL KNEE ARTHROPLASTY Right 11/05/2021   Procedure: RIGHT TOTAL KNEE ARTHROPLASTY;  Surgeon: Newt Minion, MD;  Location: [place];  Service: Orthopedics;  Laterality: Right;   TOTAL KNEE ARTHROPLASTY Left 08/31/2022   Procedure: LEFT TOTAL KNEE ARTHROPLASTY;  Surgeon: Newt Minion, MD;  Location: [place];  Service: Orthopedics;  Laterality: Left;   Social History   Occupational History   Not on file  Tobacco Use   Smoking status: Former    Packs/day: 0.50    Years: 5.00    Total pack years: 2.50    Types: Cigarettes    Quit date: 07/05/1994    Years since quitting:  28.2   Smokeless tobacco: Never  Vaping Use   Vaping Use: Never used  Substance and Sexual Activity   Alcohol use: No   Drug use: No    Types: Other-see comments    Comment: delta 9 gummies every night for sleep   Sexual activity: Yes    Birth control/protection: Surgical

## 2022-09-20 ENCOUNTER — Other Ambulatory Visit: Payer: Self-pay | Admitting: Orthopedic Surgery

## 2022-09-20 MED ORDER — METHOCARBAMOL 500 MG PO TABS: 500.0000 mg | ORAL_TABLET | Freq: Three times a day (TID) | ORAL | 0 refills | Status: AC

## 2022-09-21 ENCOUNTER — Other Ambulatory Visit: Payer: Self-pay | Admitting: Family

## 2022-09-21 MED ORDER — OXYCODONE-ACETAMINOPHEN 5-325 MG PO TABS: 1.0000 | ORAL_TABLET | Freq: Three times a day (TID) | ORAL | 0 refills | Status: DC | PRN

## 2022-09-27 ENCOUNTER — Other Ambulatory Visit: Payer: Self-pay | Admitting: Family

## 2022-09-27 MED ORDER — OXYCODONE-ACETAMINOPHEN 5-325 MG PO TABS: 1.0000 | ORAL_TABLET | Freq: Three times a day (TID) | ORAL | 0 refills | Status: DC | PRN

## 2022-10-06 ENCOUNTER — Ambulatory Visit (HOSPITAL_COMMUNITY): Payer: Medicare Other | Attending: Family | Admitting: Physical Therapy

## 2022-10-06 DIAGNOSIS — G8929 Other chronic pain: Secondary | ICD-10-CM | POA: Diagnosis present

## 2022-10-06 DIAGNOSIS — M25562 Pain in left knee: Secondary | ICD-10-CM | POA: Diagnosis present

## 2022-10-06 DIAGNOSIS — M1712 Unilateral primary osteoarthritis, left knee: Secondary | ICD-10-CM | POA: Insufficient documentation

## 2022-10-06 DIAGNOSIS — M25662 Stiffness of left knee, not elsewhere classified: Secondary | ICD-10-CM | POA: Diagnosis present

## 2022-10-06 DIAGNOSIS — R2689 Other abnormalities of gait and mobility: Secondary | ICD-10-CM | POA: Insufficient documentation

## 2022-10-06 DIAGNOSIS — R262 Difficulty in walking, not elsewhere classified: Secondary | ICD-10-CM | POA: Diagnosis present

## 2022-10-06 NOTE — Therapy (Signed)
OUTPATIENT PHYSICAL THERAPY LOWER EXTREMITY EVALUATION   Patient Name: Terri Logan MRN: [id number] DOB:09/05/1966, 56 y.o., female Today's Date: 10/06/2022   PT End of Session - 10/06/22 1451     Visit Number 1    Number of Visits 12    Date for PT Re-Evaluation 11/17/22    Authorization Type [employer] Medicare    Authorization Time Period no deductable, no VL, no auth    Progress Note Due on Visit 10    PT Start Time 1350    PT Stop Time 1430    PT Time Calculation (min) 40 min    Activity Tolerance Patient tolerated treatment well    Behavior During Therapy WFL for tasks assessed/performed             Past Medical History:  Diagnosis Date   Arthritis    Diabetes mellitus without complication (HCC)    GERD (gastroesophageal reflux disease)    Hypertension    Sarcoidosis    Sleep apnea    Past Surgical History:  Procedure Laterality Date   ABLATION  1993   CHOLECYSTECTOMY     15 years ago [facility]   DORSAL COMPARTMENT RELEASE Left 07/10/2015   Procedure: RELEASE DORSAL COMPARTMENT (DEQUERVAIN);  Surgeon: Vickki Hearing, MD;  Location: [facility];  Service: Orthopedics;  Laterality: Left;   KNEE ARTHROSCOPY WITH MEDIAL MENISECTOMY Right 10/20/2016   Procedure: KNEE ARTHROSCOPY WITH MEDIAL MENISECTOMY;  Surgeon: Vickki Hearing, MD;  Location: [facility];  Service: Orthopedics;  Laterality: Right;   ORIF ANKLE FRACTURE Right    TOTAL KNEE ARTHROPLASTY Right 11/05/2021   Procedure: RIGHT TOTAL KNEE ARTHROPLASTY;  Surgeon: Nadara Mustard, MD;  Location: [facility] OR;  Service: Orthopedics;  Laterality: Right;   TOTAL KNEE ARTHROPLASTY Left 08/31/2022   Procedure: LEFT TOTAL KNEE ARTHROPLASTY;  Surgeon: Nadara Mustard, MD;  Location: [facility] OR;  Service: Orthopedics;  Laterality: Left;   Patient Active Problem List   Diagnosis Date Noted   DJD (degenerative joint disease) of knee 08/31/2022   Total knee replacement status, left 08/31/2022   Total knee replacement status, right  11/05/2021   Unilateral primary osteoarthritis, right knee    Medial meniscus, posterior horn derangement, right    De Quervain's syndrome (tenosynovitis)    DIABETES MELLITUS, UNCONTROLLED 07/22/2009   ANKLE, ARTHRITIS, DEGEN./OSTEO 08/21/2008   ADJUSTMENT DISORDER WITH DEPRESSED MOOD 08/13/2008   ALLERGIC RHINITIS 05/15/2008   LEG PAIN, CHRONIC 10/05/2007   LEUKOCYTOSIS 04/05/2007   SORE THROAT 04/05/2007   SARCOIDOSIS 01/29/2007   OBESITY, MORBID 01/29/2007   OBSTRUCTIVE SLEEP APNEA 01/29/2007   MIGRAINE HEADACHE 01/29/2007   HYPERTENSION 01/29/2007   OSTEOARTHRITIS 01/29/2007    PCP: Dr. Arturo Morton  REFERRING PROVIDER: Adonis Huguenin, NP      REFERRING DIAG: LEFT TOTAL KNEE ARTHROPLASTY on 08/31/2022  THERAPY DIAG:  Difficulty in walking, stiffness of left knee, left knee pain,  mm weakness  Rationale for Evaluation and Treatment Rehabilitation  ONSET DATE: 08/31/2022  SUBJECTIVE:   SUBJECTIVE STATEMENT: Pt states that she had her operation on 08/31/2022. She has been having home health.  She is most affecting in the morning with stiffness.  She is walking in her home with a cane 90% of the time but uses a walker outsides.    PERTINENT HISTORY: DM, sarcoidosis, RT TKR  PAIN:  Are you having pain? Yes: NPRS scale: 1/10; worst pain has been a 6/10  Pain location: medial aspect of knee  Pain description: achy Aggravating factors:  immobility it becomes very stiff  Relieving factors: moving   PRECAUTIONS: None  WEIGHT BEARING RESTRICTIONS No  FALLS:  Has patient fallen in last 6 months? No  LIVING ENVIRONMENT: Lives with: lives with their family Lives in: House/apartment Stairs: Yes: External: 6 steps; on right going up goes side ways at this time  Has following equipment at home: Single point cane and Walker - 2 wheeled  OCCUPATION: unemployed   PLOF: Independent  PATIENT GOALS less pain, to be able to move better    OBJECTIVE:    PATIENT SURVEYS:  FOTO  47   EDEMA: normal for time from op   LOWER EXTREMITY ROM:  Active ROM Right eval Left eval  Hip flexion    Hip extension    Hip abduction    Hip adduction    Hip internal rotation    Hip external rotation    Knee flexion  80  Knee extension  6  Ankle dorsiflexion    Ankle plantarflexion    Ankle inversion    Ankle eversion     (Blank rows = not tested)  LOWER EXTREMITY MMT:  MMT Right eval Left eval  Hip flexion 5 3-  Hip extension    Hip abduction 5 3  Hip adduction    Hip internal rotation    Hip external rotation    Knee flexion 5 3+  Knee extension 5 3  Ankle dorsiflexion 5 4  Ankle plantarflexion    Ankle inversion    Ankle eversion     (Blank rows = not tested)    FUNCTIONAL TESTS:  30 seconds chair stand test:  8  2 minute walk test: 226' with rolling walker  Single leg stance:  RT: 13:; LT: 0:     TODAY'S TREATMENT: Evaluation:   Sitting:  LAQ x 10 Supine:  SLR x 10                Bridge x 10 Side lying hip abduction x 10    PATIENT EDUCATION:  Education details: Pt has HEP from [facility] doing standing and supine exercises.  Person educated: Patient Education method: Explanation Education comprehension: verbalized understanding and has been completing    HOME EXERCISE PROGRAM: Home ex from [facility]:  Heel raises, squat, quad set, heel slide, supine hip abduction Access Code: [email or web address] URL: [email or web address] Date: 10/06/2022 Prepared by: Rayetta Humphrey  Exercises - Sidelying Pelvic Floor Contraction with Hip Abduction  - 2 x daily - 7 x weekly - 1 sets - 10 reps - 3" hold - Prone Knee Flexion AROM  - 2 x daily - 7 x weekly - 1 sets - 10 reps - 3" hold - Supine Bridge  - 2 x daily - 7 x weekly - 1 sets - 10 reps - 3" hold ASSESSMENT:  CLINICAL IMPRESSION: Patient is a 56 y.o. female who was seen today for physical therapy evaluation and treatment for left total knee replacement.  Evaluation demonstrates decreased ROM,  decreased strength, decreased activity tolerance, decreased balance and increased pain.  [name] will benefit from skilled PT to address these issues and maximize her functional ability. .    OBJECTIVE IMPAIRMENTS Abnormal gait, decreased activity tolerance, decreased balance, decreased mobility, difficulty walking, decreased ROM, decreased strength, increased edema, and pain.   ACTIVITY LIMITATIONS carrying, lifting, standing, squatting, stairs, and locomotion level  PARTICIPATION LIMITATIONS: cleaning, laundry, shopping, community activity, and yard work  [place] and 1-2 comorbidities: dm, sarcoidosis  are also affecting patient's  functional outcome.   REHAB POTENTIAL: Good  CLINICAL DECISION MAKING: Stable/uncomplicated  EVALUATION COMPLEXITY: Moderate   GOALS: Goals reviewed with patient? No  SHORT TERM GOALS: Target date: 10/27/2022  Pt to be I in HEP in order to improve knee flexion to 110 to allow pt to squat to get items in lower cabinets.  Baseline: Goal status: INITIAL  2.  Pt pain to be decreased to no greater than a 3/10 so pt can be standing/ walking for 30 minutes.  Baseline:  Goal status: INITIAL  3.  PT strength improved to 4/5 to allow pt to walk outside with a cane Baseline:  Goal status: INITIAL  4.  PT to be able to single leg stance with both LE for 10 "  Baseline:  Goal status: INITIAL   LONG TERM GOALS: Target date: 11/17/2022   Pt to be I in advanced HEP to increase knee flexion to 120 for improved mobility Baseline:  Goal status: INITIAL  2.  PT pain to be no greater than a 1/10 to allow pt to be up standing/walking for an hour for shopping  Baseline:  Goal status: INITIAL  3.  PT LE strength to be 4+/5 to be able to get up from the floor to be able to play with her grandchildren  Baseline:  Goal status: INITIAL  4.  PT to be able to single leg stance for 15" each to feel secure in ambulation without an assistive  device.  Baseline:  Goal status: INITIAL  PT FREQUENCY: 2x/week  PT DURATION: 6 weeks  PLANNED INTERVENTIONS: Therapeutic exercises, Therapeutic activity, Balance training, Gait training, Patient/Family education, Self Care, and Manual therapy  PLAN FOR NEXT SESSION: focus on improving ROM then strength   Rayetta Humphrey, PT CLT [phone]  10/06/2022, 2:53 PM

## 2022-10-11 ENCOUNTER — Encounter (HOSPITAL_COMMUNITY): Payer: Medicare Other

## 2022-10-12 ENCOUNTER — Ambulatory Visit (HOSPITAL_COMMUNITY): Payer: Medicare Other

## 2022-10-12 DIAGNOSIS — M1712 Unilateral primary osteoarthritis, left knee: Secondary | ICD-10-CM

## 2022-10-12 DIAGNOSIS — M25562 Pain in left knee: Secondary | ICD-10-CM

## 2022-10-12 DIAGNOSIS — M25662 Stiffness of left knee, not elsewhere classified: Secondary | ICD-10-CM

## 2022-10-12 DIAGNOSIS — R262 Difficulty in walking, not elsewhere classified: Secondary | ICD-10-CM | POA: Diagnosis not present

## 2022-10-12 NOTE — Therapy (Signed)
OUTPATIENT PHYSICAL THERAPY LOWER EXTREMITY TREATMENT   Patient Name: Terri Logan MRN: [id number] DOB:1966-11-02, 56 y.o., female Today's Date: 10/12/2022   PT End of Session - 10/12/22 1651     Visit Number 2    Number of Visits 12    Date for PT Re-Evaluation 11/17/22    Authorization Type UHC Medicare    Authorization Time Period no deductable, no VL, no auth    Progress Note Due on Visit 10    PT Start Time 0445    PT Stop Time 0530    PT Time Calculation (min) 45 min    Activity Tolerance Patient tolerated treatment well    Behavior During Therapy WFL for tasks assessed/performed              Past Medical History:  Diagnosis Date   Arthritis    Diabetes mellitus without complication (HCC)    GERD (gastroesophageal reflux disease)    Hypertension    Sarcoidosis    Sleep apnea    Past Surgical History:  Procedure Laterality Date   ABLATION  1993   CHOLECYSTECTOMY     15 years ago [facility]   DORSAL COMPARTMENT RELEASE Left 07/10/2015   Procedure: RELEASE DORSAL COMPARTMENT (DEQUERVAIN);  Surgeon: Vickki Hearing, MD;  Location: [facility];  Service: Orthopedics;  Laterality: Left;   KNEE ARTHROSCOPY WITH MEDIAL MENISECTOMY Right 10/20/2016   Procedure: KNEE ARTHROSCOPY WITH MEDIAL MENISECTOMY;  Surgeon: Vickki Hearing, MD;  Location: [facility];  Service: Orthopedics;  Laterality: Right;   ORIF ANKLE FRACTURE Right    TOTAL KNEE ARTHROPLASTY Right 11/05/2021   Procedure: RIGHT TOTAL KNEE ARTHROPLASTY;  Surgeon: Nadara Mustard, MD;  Location: [facility] OR;  Service: Orthopedics;  Laterality: Right;   TOTAL KNEE ARTHROPLASTY Left 08/31/2022   Procedure: LEFT TOTAL KNEE ARTHROPLASTY;  Surgeon: Nadara Mustard, MD;  Location: [facility] OR;  Service: Orthopedics;  Laterality: Left;   Patient Active Problem List   Diagnosis Date Noted   DJD (degenerative joint disease) of knee 08/31/2022   Total knee replacement status, left 08/31/2022   Total knee replacement status, right  11/05/2021   Unilateral primary osteoarthritis, right knee    Medial meniscus, posterior horn derangement, right    De Quervain's syndrome (tenosynovitis)    DIABETES MELLITUS, UNCONTROLLED 07/22/2009   ANKLE, ARTHRITIS, DEGEN./OSTEO 08/21/2008   ADJUSTMENT DISORDER WITH DEPRESSED MOOD 08/13/2008   ALLERGIC RHINITIS 05/15/2008   LEG PAIN, CHRONIC 10/05/2007   LEUKOCYTOSIS 04/05/2007   SORE THROAT 04/05/2007   SARCOIDOSIS 01/29/2007   OBESITY, MORBID 01/29/2007   OBSTRUCTIVE SLEEP APNEA 01/29/2007   MIGRAINE HEADACHE 01/29/2007   HYPERTENSION 01/29/2007   OSTEOARTHRITIS 01/29/2007    PCP: Dr. Arturo Morton  REFERRING PROVIDER: Adonis Huguenin, NP      REFERRING DIAG: LEFT TOTAL KNEE ARTHROPLASTY on 08/31/2022  THERAPY DIAG:  Difficulty in walking, stiffness of left knee, left knee pain,  mm weakness  Rationale for Evaluation and Treatment Rehabilitation  ONSET DATE: 08/31/2022  SUBJECTIVE:   SUBJECTIVE STATEMENT: 2/10 pain today; increased pain yesterday with increased activity around the house.  PERTINENT HISTORY: DM, sarcoidosis, RT TKR  PAIN:  Are you having pain? Yes: NPRS scale: 2/10; worst pain has been a 6/10  Pain location: medial aspect of knee  Pain description: achy Aggravating factors: immobility it becomes very stiff  Relieving factors: moving   PRECAUTIONS: None  WEIGHT BEARING RESTRICTIONS No  FALLS:  Has patient fallen in last 6 months? No  LIVING ENVIRONMENT: Lives  with: lives with their family Lives in: House/apartment Stairs: Yes: External: 6 steps; on right going up goes side ways at this time  Has following equipment at home: Single point cane and Walker - 2 wheeled  OCCUPATION: unemployed   PLOF: Independent  PATIENT GOALS less pain, to be able to move better    OBJECTIVE:    PATIENT SURVEYS:  FOTO 47   EDEMA: normal for time from op   LOWER EXTREMITY ROM:  Active ROM Right eval Left eval Left 10/12/22  Hip flexion     Hip  extension     Hip abduction     Hip adduction     Hip internal rotation     Hip external rotation     Knee flexion  80 102  Knee extension  6 -7  Ankle dorsiflexion     Ankle plantarflexion     Ankle inversion     Ankle eversion      (Blank rows = not tested)  LOWER EXTREMITY MMT:  MMT Right eval Left eval  Hip flexion 5 3-  Hip extension    Hip abduction 5 3  Hip adduction    Hip internal rotation    Hip external rotation    Knee flexion 5 3+  Knee extension 5 3  Ankle dorsiflexion 5 4  Ankle plantarflexion    Ankle inversion    Ankle eversion     (Blank rows = not tested)    FUNCTIONAL TESTS:  30 seconds chair stand test:  8  2 minute walk test: 226' with rolling walker  Single leg stance:  RT: 13:; LT: 0:     TODAY'S TREATMENT: 10/12/22 Review of HEP and goals Supine: Quad set 5" x 10 Heel slides x 10 Extension hang 2# x 2' SAQ's 2# 3 sec hold 2 x 10 Bridge 3# x 10  Standing: Heel raises 2 x 10 12" step knee drives left x 2' for flexion  TKE's green TB x 20         Evaluation:   Sitting:  LAQ x 10 Supine:  SLR x 10                Bridge x 10 Side lying hip abduction x 10    PATIENT EDUCATION:  Education details: Pt has HEP from [facility] doing standing and supine exercises.  Person educated: Patient Education method: Explanation Education comprehension: verbalized understanding and has been completing    HOME EXERCISE PROGRAM: Home ex from [facility]:  Heel raises, squat, quad set, heel slide, supine hip abduction Access Code: [email or web address] URL: [email or web address] Date: 10/06/2022 Prepared by: Rayetta Humphrey  Exercises - Sidelying Pelvic Floor Contraction with Hip Abduction  - 2 x daily - 7 x weekly - 1 sets - 10 reps - 3" hold - Prone Knee Flexion AROM  - 2 x daily - 7 x weekly - 1 sets - 10 reps - 3" hold - Supine Bridge  - 2 x daily - 7 x weekly - 1 sets - 10 reps - 3" hold ASSESSMENT:  CLINICAL IMPRESSION: Today's visit  started with a review of HEP and goals; patient verbalizes agreement with set rehab goals. Good improvement with knee flexion today. Patient able to walk short distance with hand held assist only today. Patient will benefit from continued skilled therapy services to address deficits and promote return to optimal function.       OBJECTIVE IMPAIRMENTS Abnormal gait, decreased activity tolerance, decreased balance, decreased mobility, difficulty walking,  decreased ROM, decreased strength, increased edema, and pain.   ACTIVITY LIMITATIONS carrying, lifting, standing, squatting, stairs, and locomotion level  PARTICIPATION LIMITATIONS: cleaning, laundry, shopping, community activity, and yard work  PERSONAL FACTORS Fitness and 1-2 comorbidities: dm, sarcoidosis  are also affecting patient's functional outcome.   REHAB POTENTIAL: Good  CLINICAL DECISION MAKING: Stable/uncomplicated  EVALUATION COMPLEXITY: Moderate   GOALS: Goals reviewed with patient? Yes  SHORT TERM GOALS: Target date: 11/02/2022  Pt to be I in HEP in order to improve knee flexion to 110 to allow pt to squat to get items in lower cabinets.  Baseline:10/12/22 102 Goal status: IN PROGRESS  2.  Pt pain to be decreased to no greater than a 3/10 so pt can be standing/ walking for 30 minutes.  Baseline:  Goal status: IN PROGRESS  3.  PT strength improved to 4/5 to allow pt to walk outside with a cane Baseline:  Goal status: IN PROGRESS  4.  PT to be able to single leg stance with both LE for 10 "  Baseline:  Goal status: IN PROGRESS   LONG TERM GOALS: Target date: 11/23/2022   Pt to be I in advanced HEP to increase knee flexion to 120 for improved mobility Baseline:  Goal status: IN PROGRESS  2.  PT pain to be no greater than a 1/10 to allow pt to be up standing/walking for an hour for shopping  Baseline:  Goal status: IN PROGRESS  3.  PT LE strength to be 4+/5 to be able to get up from the floor to be able to  play with her grandchildren  Baseline:  Goal status: IN PROGRESS  4.  PT to be able to single leg stance for 15" each to feel secure in ambulation without an assistive device.  Baseline:  Goal status: IN PROGRESS  PT FREQUENCY: 2x/week  PT DURATION: 6 weeks  PLANNED INTERVENTIONS: Therapeutic exercises, Therapeutic activity, Balance training, Gait training, Patient/Family education, Self Care, and Manual therapy  PLAN FOR NEXT SESSION: focus on improving ROM then strength   5:34 PM, 10/12/22 Tip Atienza Small Pauleen Goleman MPT [place]  [phone] Ph:[phone]

## 2022-10-13 ENCOUNTER — Ambulatory Visit (HOSPITAL_COMMUNITY): Payer: Medicare Other

## 2022-10-13 ENCOUNTER — Encounter (HOSPITAL_COMMUNITY): Payer: Self-pay

## 2022-10-13 DIAGNOSIS — M25562 Pain in left knee: Secondary | ICD-10-CM

## 2022-10-13 DIAGNOSIS — R2689 Other abnormalities of gait and mobility: Secondary | ICD-10-CM

## 2022-10-13 DIAGNOSIS — R262 Difficulty in walking, not elsewhere classified: Secondary | ICD-10-CM

## 2022-10-13 DIAGNOSIS — M25662 Stiffness of left knee, not elsewhere classified: Secondary | ICD-10-CM

## 2022-10-13 NOTE — Therapy (Signed)
OUTPATIENT PHYSICAL THERAPY LOWER EXTREMITY TREATMENT   Patient Name: Terri Logan MRN: [id number] DOB:[birth year], 56 y.o., female Today's Date: 10/13/2022   PT End of Session - 10/13/22 1346     Visit Number 3    Number of Visits 12    Date for PT Re-Evaluation 11/17/22    Authorization Type UHC Medicare    Authorization Time Period no deductable, no VL, no auth    Progress Note Due on Visit 10    PT Start Time 1347    PT Stop Time 1426    PT Time Calculation (min) 39 min    Activity Tolerance Patient tolerated treatment well    Behavior During Therapy WFL for tasks assessed/performed              Past Medical History:  Diagnosis Date   Arthritis    Diabetes mellitus without complication ([place])    GERD (gastroesophageal reflux disease)    Hypertension    Sarcoidosis    Sleep apnea    Past Surgical History:  Procedure Laterality Date   ABLATION  1993   CHOLECYSTECTOMY     15 years ago [facility]   DORSAL COMPARTMENT RELEASE Left 07/10/2015   Procedure: RELEASE DORSAL COMPARTMENT (DEQUERVAIN);  Surgeon: Carole Civil, MD;  Location: [facility];  Service: Orthopedics;  Laterality: Left;   KNEE ARTHROSCOPY WITH MEDIAL MENISECTOMY Right 10/20/2016   Procedure: KNEE ARTHROSCOPY WITH MEDIAL MENISECTOMY;  Surgeon: Carole Civil, MD;  Location: [facility];  Service: Orthopedics;  Laterality: Right;   ORIF ANKLE FRACTURE Right    TOTAL KNEE ARTHROPLASTY Right 11/05/2021   Procedure: RIGHT TOTAL KNEE ARTHROPLASTY;  Surgeon: Newt Minion, MD;  Location: [place];  Service: Orthopedics;  Laterality: Right;   TOTAL KNEE ARTHROPLASTY Left 08/31/2022   Procedure: LEFT TOTAL KNEE ARTHROPLASTY;  Surgeon: Newt Minion, MD;  Location: [place];  Service: Orthopedics;  Laterality: Left;   Patient Active Problem List   Diagnosis Date Noted   DJD (degenerative joint disease) of knee 08/31/2022   Total knee replacement status, left 08/31/2022   Total knee replacement status, right  11/05/2021   Unilateral primary osteoarthritis, right knee    Medial meniscus, posterior horn derangement, right    De Quervain's syndrome (tenosynovitis)    DIABETES MELLITUS, UNCONTROLLED 07/22/2009   ANKLE, ARTHRITIS, DEGEN./OSTEO 08/21/2008   ADJUSTMENT DISORDER WITH DEPRESSED MOOD 08/13/2008   ALLERGIC RHINITIS 05/15/2008   LEG PAIN, CHRONIC 10/05/2007   LEUKOCYTOSIS 04/05/2007   SORE THROAT 04/05/2007   SARCOIDOSIS 01/29/2007   OBESITY, MORBID 01/29/2007   OBSTRUCTIVE SLEEP APNEA 01/29/2007   MIGRAINE HEADACHE 01/29/2007   HYPERTENSION 01/29/2007   OSTEOARTHRITIS 01/29/2007    PCP: Dr. Kari Baars  REFERRING PROVIDER: Suzan Slick, NP      REFERRING DIAG: LEFT TOTAL KNEE ARTHROPLASTY on 08/31/2022  THERAPY DIAG:  Difficulty in walking, stiffness of left knee, left knee pain,  mm weakness  Rationale for Evaluation and Treatment Rehabilitation  ONSET DATE: 08/31/2022  SUBJECTIVE:   SUBJECTIVE STATEMENT: Pt reports pain scale 2/10 Lt knee, reports a shortage in hydrocoden so has been taking Tylenol to help.  Has requested different medication from MD, returns tomorrow.  Reports she has difficulty sleeping due to pain, able to tolerate 2.5 sleep before awaking.  Pt wishes to return to [facility] by 10/27/22.  Began prone knee hang for 5 minutes yesterday, feels she over did it to begin with.    PERTINENT HISTORY: DM, sarcoidosis, RT TKR  PAIN:  Are you having pain? Yes: NPRS scale: 2/10; worst pain has been a 6/10  Pain location: medial aspect of knee  Pain description: achy Aggravating factors: immobility it becomes very stiff  Relieving factors: moving   PRECAUTIONS: None  WEIGHT BEARING RESTRICTIONS No  FALLS:  Has patient fallen in last 6 months? No  LIVING ENVIRONMENT: Lives with: lives with their family Lives in: House/apartment Stairs: Yes: External: 6 steps; on right going up goes side ways at this time  Has following equipment at home: Single point cane and  Walker - 2 wheeled  OCCUPATION: unemployed   PLOF: Independent  PATIENT GOALS less pain, to be able to move better    OBJECTIVE:    PATIENT SURVEYS:  FOTO 47   EDEMA: normal for time from op   LOWER EXTREMITY ROM:  Active ROM Right eval Left eval Left 10/12/22 Left  10/13/22  Hip flexion      Hip extension      Hip abduction      Hip adduction      Hip internal rotation      Hip external rotation      Knee flexion  80 102 100  Knee extension  6 -7 6  Ankle dorsiflexion      Ankle plantarflexion      Ankle inversion      Ankle eversion       (Blank rows = not tested)  LOWER EXTREMITY MMT:  MMT Right eval Left eval  Hip flexion 5 3-  Hip extension    Hip abduction 5 3  Hip adduction    Hip internal rotation    Hip external rotation    Knee flexion 5 3+  Knee extension 5 3  Ankle dorsiflexion 5 4  Ankle plantarflexion    Ankle inversion    Ankle eversion     (Blank rows = not tested)    FUNCTIONAL TESTS:  30 seconds chair stand test:  8  2 minute walk test: 226' with rolling walker  Single leg stance:  RT: 13:; LT: 0:     TODAY'S TREATMENT: 10/13/22 Seated: heel slides with AAROM at end range  LAQ 10x 3" holds 2 sets  Standing: Heel raise 10x Toe raises 10x Hamstring curls 10x Squat 10x SLS Rt 7", Lt 0"  with SPC 122ft  Supine:  Heel slide SAQ 3# 2x 10 SLR 10 AROM 6-100 degrees  10/12/22 Review of HEP and goals Supine: Quad set 5" x 10 Heel slides x 10 Extension hang 2# x 2' SAQ's 2# 3 sec hold 2 x 10 Bridge 3# x 10  Standing: Heel raises 2 x 10 12" step knee drives left x 2' for flexion  TKE's green TB x 20         Evaluation:   Sitting:  LAQ x 10 Supine:  SLR x 10                Bridge x 10 Side lying hip abduction x 10    PATIENT EDUCATION:  Education details: Pt has HEP from [facility] doing standing and supine exercises.  Person educated: Patient Education method: Explanation Education  comprehension: verbalized understanding and has been completing    HOME EXERCISE PROGRAM: Home ex from [facility]:  Heel raises, squat, quad set, heel slide, supine hip abduction Access Code: [email or web address] URL: [email or web address] Date: 10/06/2022 Prepared by: Virgina Organ 10/13/22: Standing TKE   Exercises - Sidelying Pelvic Floor Contraction with Hip Abduction  - 2 x  daily - 7 x weekly - 1 sets - 10 reps - 3" hold - Prone Knee Flexion AROM  - 2 x daily - 7 x weekly - 1 sets - 10 reps - 3" hold - Supine Bridge  - 2 x daily - 7 x weekly - 1 sets - 10 reps - 3" hold ASSESSMENT:  CLINICAL IMPRESSION: Session focus with knee mobility and proximal strengthening.  Pt progressing well with all exercises, monitored pain through session.  Reviewed goals prior MD apt scheduled for tomorrow.  Reviewed goals with improved AROM 6-100 degrees.  Pt now ambulating with SPC in home and continues with RW outside, reports increased tolerance for standing and walking.  Reports compliance wiht HEP daily.  Has added additional prone knee hang and standing TKE to address extension lag.  Pt driven and wishes to return to [facility] by beginning of November.  Will continues to address mobility and strength deficits to return to optimal function.   OBJECTIVE IMPAIRMENTS Abnormal gait, decreased activity tolerance, decreased balance, decreased mobility, difficulty walking, decreased ROM, decreased strength, increased edema, and pain.   ACTIVITY LIMITATIONS carrying, lifting, standing, squatting, stairs, and locomotion level  PARTICIPATION LIMITATIONS: cleaning, laundry, shopping, community activity, and yard work  PERSONAL FACTORS Fitness and 1-2 comorbidities: dm, sarcoidosis  are also affecting patient's functional outcome.   REHAB POTENTIAL: Good  CLINICAL DECISION MAKING: Stable/uncomplicated  EVALUATION COMPLEXITY: Moderate   GOALS: Goals reviewed with patient? Yes  SHORT TERM GOALS: Target date:  11/02/2022  Pt to be I in HEP in order to improve knee flexion to 110 to allow pt to squat to get items in lower cabinets.  Baseline:10/12/22 102; 10/19: Reports compliance iwht HEP daily, AROM 6-100 degrees Goal status: IN PROGRESS  2.  Pt pain to be decreased to no greater than a 3/10 so pt can be standing/ walking for 30 minutes.  Baseline: 10/13/22:  Highest pain scale 3/10, able to walk with [facility] for 15 minutes Goal status: IN PROGRESS  3.  PT strength improved to 4/5 to allow pt to walk outside with a cane Baseline: 10/19 walking with SPC at home, no MMT complete this session Goal status: IN PROGRESS  4.  PT to be able to single leg stance with both LE for 10 "  Baseline: 10/13/22: Rt 7", Lt unable without HHA Goal status: IN PROGRESS   LONG TERM GOALS: Target date: 11/23/2022   Pt to be I in advanced HEP to increase knee flexion to 120 for improved mobility Baseline:  Goal status: IN PROGRESS  2.  PT pain to be no greater than a 1/10 to allow pt to be up standing/walking for an hour for shopping  Baseline:  Goal status: IN PROGRESS  3.  PT LE strength to be 4+/5 to be able to get up from the floor to be able to play with her grandchildren  Baseline:  Goal status: IN PROGRESS  4.  PT to be able to single leg stance for 15" each to feel secure in ambulation without an assistive device.  Baseline:  Goal status: IN PROGRESS  PT FREQUENCY: 2x/week  PT DURATION: 6 weeks  PLANNED INTERVENTIONS: Therapeutic exercises, Therapeutic activity, Balance training, Gait training, Patient/Family education, Self Care, and Manual therapy  PLAN FOR NEXT SESSION: focus on improving ROM then strength.  F/U with MD with apt 10/14/22.  Becky Sax, LPTA/CLT; [place] [phone]  2:46 PM, 10/13/22

## 2022-10-14 ENCOUNTER — Encounter: Payer: Medicare Other | Admitting: Family

## 2022-10-17 ENCOUNTER — Encounter: Payer: Self-pay | Admitting: Orthopedic Surgery

## 2022-10-17 ENCOUNTER — Other Ambulatory Visit: Payer: Self-pay | Admitting: Family

## 2022-10-17 ENCOUNTER — Ambulatory Visit (INDEPENDENT_AMBULATORY_CARE_PROVIDER_SITE_OTHER): Payer: Medicare Other | Admitting: Orthopedic Surgery

## 2022-10-17 DIAGNOSIS — M1712 Unilateral primary osteoarthritis, left knee: Secondary | ICD-10-CM

## 2022-10-17 MED ORDER — OXYCODONE-ACETAMINOPHEN 5-325 MG PO TABS: 1.0000 | ORAL_TABLET | Freq: Three times a day (TID) | ORAL | 0 refills | Status: DC | PRN

## 2022-10-17 NOTE — Progress Notes (Signed)
Office Visit Note   Patient: Terri Logan           Date of Birth: 02-27-1966           MRN: [id number] Visit Date: 10/17/2022              Requested by: Pablo Lawrence, NP [place],  [place] [address] PCP: Pablo Lawrence, NP  Chief Complaint  Patient presents with   Left Knee - Routine Post Op    08/31/2022 left tot knee replacement       HPI: Patient is a 56 year old woman who is 6 weeks status post left total knee arthroplasty.  She is working with outpatient therapy.  Patient states she feels well.  Assessment & Plan: Visit Diagnoses:  1. Unilateral primary osteoarthritis, left knee     Plan: Patient will continue with therapy continue with scar massage.  Follow-Up Instructions: Return in about 4 weeks (around 11/14/2022).   Ortho Exam  Patient is alert, oriented, no adenopathy, well-dressed, normal affect, normal respiratory effort. Examination patient lacks about 5 degrees to full extension flexion to past 100 degrees.  She has some mild thickening of the scar and she will continue with scar massage.  Imaging: No results found. No images are attached to the encounter.  Labs: Lab Results  Component Value Date   HGBA1C 7.7 (H) 08/24/2022   HGBA1C 7.6 (H) 11/02/2021   HGBA1C 8.6 07/22/2009     Lab Results  Component Value Date   ALBUMIN 3.6 10/16/2019   ALBUMIN 4.2 07/08/2009    No results found for: "MG" No results found for: "VD25OH"  No results found for: "PREALBUMIN"    Latest Ref Rng & Units 08/24/2022    1:30 PM 11/02/2021   10:18 AM 10/16/2019    4:33 PM  CBC EXTENDED  WBC 4.0 - 10.5 K/uL 8.4  9.3  7.2   RBC 3.87 - 5.11 MIL/uL 4.50  4.21  4.66   Hemoglobin 12.0 - 15.0 g/dL 12.1  11.6  12.4   HCT 36.0 - 46.0 % 38.4  36.5  39.3   Platelets 150 - 400 K/uL 334  312  286   NEUT# 1.7 - 7.7 K/uL   4.2   Lymph# 0.7 - 4.0 K/uL   2.3      There is no height or weight on file to calculate BMI.  Orders:  No  orders of the defined types were placed in this encounter.  No orders of the defined types were placed in this encounter.    Procedures: No procedures performed  Clinical Data: No additional findings.  ROS:  All other systems negative, except as noted in the HPI. Review of Systems  Objective: Vital Signs: There were no vitals taken for this visit.  Specialty Comments:  No specialty comments available.  PMFS History: Patient Active Problem List   Diagnosis Date Noted   DJD (degenerative joint disease) of knee 08/31/2022   Total knee replacement status, left 08/31/2022   Total knee replacement status, right 11/05/2021   Unilateral primary osteoarthritis, right knee    Medial meniscus, posterior horn derangement, right    De Quervain's syndrome (tenosynovitis)    DIABETES MELLITUS, UNCONTROLLED 07/22/2009   ANKLE, ARTHRITIS, DEGEN./OSTEO 08/21/2008   ADJUSTMENT DISORDER WITH DEPRESSED MOOD 08/13/2008   ALLERGIC RHINITIS 05/15/2008   LEG PAIN, CHRONIC 10/05/2007   LEUKOCYTOSIS 04/05/2007   SORE THROAT 04/05/2007   SARCOIDOSIS 01/29/2007   OBESITY, MORBID 01/29/2007  OBSTRUCTIVE SLEEP APNEA 01/29/2007   MIGRAINE HEADACHE 01/29/2007   HYPERTENSION 01/29/2007   OSTEOARTHRITIS 01/29/2007   Past Medical History:  Diagnosis Date   Arthritis    Diabetes mellitus without complication (HCC)    GERD (gastroesophageal reflux disease)    Hypertension    Sarcoidosis    Sleep apnea     History reviewed. No pertinent family history.  Past Surgical History:  Procedure Laterality Date   ABLATION  1993   CHOLECYSTECTOMY     15 years ago [facility]   DORSAL COMPARTMENT RELEASE Left 07/10/2015   Procedure: RELEASE DORSAL COMPARTMENT (DEQUERVAIN);  Surgeon: Vickki Hearing, MD;  Location: [facility];  Service: Orthopedics;  Laterality: Left;   KNEE ARTHROSCOPY WITH MEDIAL MENISECTOMY Right 10/20/2016   Procedure: KNEE ARTHROSCOPY WITH MEDIAL MENISECTOMY;  Surgeon: Vickki Hearing,  MD;  Location: [facility];  Service: Orthopedics;  Laterality: Right;   ORIF ANKLE FRACTURE Right    TOTAL KNEE ARTHROPLASTY Right 11/05/2021   Procedure: RIGHT TOTAL KNEE ARTHROPLASTY;  Surgeon: Nadara Mustard, MD;  Location: [facility] OR;  Service: Orthopedics;  Laterality: Right;   TOTAL KNEE ARTHROPLASTY Left 08/31/2022   Procedure: LEFT TOTAL KNEE ARTHROPLASTY;  Surgeon: Nadara Mustard, MD;  Location: [facility] OR;  Service: Orthopedics;  Laterality: Left;   Social History   Occupational History   Not on file  Tobacco Use   Smoking status: Former    Packs/day: 0.50    Years: 5.00    Total pack years: 2.50    Types: Cigarettes    Quit date: 07/05/1994    Years since quitting: 28.3   Smokeless tobacco: Never  Vaping Use   Vaping Use: Never used  Substance and Sexual Activity   Alcohol use: No   Drug use: No    Types: Other-see comments    Comment: delta 9 gummies every night for sleep   Sexual activity: Yes    Birth control/protection: Surgical

## 2022-10-18 ENCOUNTER — Encounter (HOSPITAL_COMMUNITY): Payer: Self-pay

## 2022-10-18 ENCOUNTER — Ambulatory Visit (HOSPITAL_COMMUNITY): Payer: Medicare Other

## 2022-10-18 DIAGNOSIS — G8929 Other chronic pain: Secondary | ICD-10-CM

## 2022-10-18 DIAGNOSIS — R262 Difficulty in walking, not elsewhere classified: Secondary | ICD-10-CM

## 2022-10-18 DIAGNOSIS — M25662 Stiffness of left knee, not elsewhere classified: Secondary | ICD-10-CM

## 2022-10-18 DIAGNOSIS — M25562 Pain in left knee: Secondary | ICD-10-CM

## 2022-10-18 DIAGNOSIS — R2689 Other abnormalities of gait and mobility: Secondary | ICD-10-CM

## 2022-10-18 DIAGNOSIS — M1712 Unilateral primary osteoarthritis, left knee: Secondary | ICD-10-CM

## 2022-10-18 NOTE — Therapy (Signed)
OUTPATIENT PHYSICAL THERAPY LOWER EXTREMITY TREATMENT   Patient Name: Terri Logan MRN: [id number] DOB:1966-12-09, 56 y.o., female Today's Date: 10/18/2022      Past Medical History:  Diagnosis Date   Arthritis    Diabetes mellitus without complication ([place])    GERD (gastroesophageal reflux disease)    Hypertension    Sarcoidosis    Sleep apnea    Past Surgical History:  Procedure Laterality Date   ABLATION  1993   CHOLECYSTECTOMY     15 years ago [facility]   DORSAL COMPARTMENT RELEASE Left 07/10/2015   Procedure: RELEASE DORSAL COMPARTMENT (DEQUERVAIN);  Surgeon: Carole Civil, MD;  Location: [facility];  Service: Orthopedics;  Laterality: Left;   KNEE ARTHROSCOPY WITH MEDIAL MENISECTOMY Right 10/20/2016   Procedure: KNEE ARTHROSCOPY WITH MEDIAL MENISECTOMY;  Surgeon: Carole Civil, MD;  Location: [facility];  Service: Orthopedics;  Laterality: Right;   ORIF ANKLE FRACTURE Right    TOTAL KNEE ARTHROPLASTY Right 11/05/2021   Procedure: RIGHT TOTAL KNEE ARTHROPLASTY;  Surgeon: Newt Minion, MD;  Location: [place];  Service: Orthopedics;  Laterality: Right;   TOTAL KNEE ARTHROPLASTY Left 08/31/2022   Procedure: LEFT TOTAL KNEE ARTHROPLASTY;  Surgeon: Newt Minion, MD;  Location: [place];  Service: Orthopedics;  Laterality: Left;   Patient Active Problem List   Diagnosis Date Noted   DJD (degenerative joint disease) of knee 08/31/2022   Total knee replacement status, left 08/31/2022   Total knee replacement status, right 11/05/2021   Unilateral primary osteoarthritis, right knee    Medial meniscus, posterior horn derangement, right    De Quervain's syndrome (tenosynovitis)    DIABETES MELLITUS, UNCONTROLLED 07/22/2009   ANKLE, ARTHRITIS, DEGEN./OSTEO 08/21/2008   ADJUSTMENT DISORDER WITH DEPRESSED MOOD 08/13/2008   ALLERGIC RHINITIS 05/15/2008   LEG PAIN, CHRONIC 10/05/2007   LEUKOCYTOSIS 04/05/2007   SORE THROAT 04/05/2007   SARCOIDOSIS 01/29/2007   OBESITY, MORBID  01/29/2007   OBSTRUCTIVE SLEEP APNEA 01/29/2007   MIGRAINE HEADACHE 01/29/2007   HYPERTENSION 01/29/2007   OSTEOARTHRITIS 01/29/2007    PCP: Dr. Kari Baars  REFERRING PROVIDER: Suzan Slick, NP Next apt:  11/14/22      REFERRING DIAG: LEFT TOTAL KNEE ARTHROPLASTY on 08/31/2022  THERAPY DIAG:  Difficulty in walking, stiffness of left knee, left knee pain,  mm weakness  Rationale for Evaluation and Treatment Rehabilitation  ONSET DATE: 08/31/2022  SUBJECTIVE:   SUBJECTIVE STATEMENT: Pt reports pain scale 2/10 Lt knee, reports a shortage in hydrocoden so has been taking Tylenol to help.  Has requested different medication from MD, returns tomorrow.  Reports she has difficulty sleeping due to pain, able to tolerate 2.5 sleep before awaking.  Pt wishes to return to [facility] by 10/27/22.  Began prone knee hang for 5 minutes yesterday, feels she over did it to begin with.    Pt arrived with [facility], stated first time walking out in public with LRAD.  Reports MD happy with progress, returns in 4 weeks.    PERTINENT HISTORY: DM, sarcoidosis, RT TKR  PAIN:  Are you having pain? Yes: NPRS scale: 2/10; worst pain has been a 6/10  Pain location: medial aspect of knee  Pain description: achy Aggravating factors: immobility it becomes very stiff  Relieving factors: moving   PRECAUTIONS: None  WEIGHT BEARING RESTRICTIONS No  FALLS:  Has patient fallen in last 6 months? No  LIVING ENVIRONMENT: Lives with: lives with their family Lives in: House/apartment Stairs: Yes: External: 6 steps; on right going up goes side  ways at this time  Has following equipment at home: Single point cane and Walker - 2 wheeled  OCCUPATION: unemployed   PLOF: Independent  PATIENT GOALS less pain, to be able to move better    OBJECTIVE:    PATIENT SURVEYS:  FOTO 47   EDEMA: normal for time from op   LOWER EXTREMITY ROM:  Active ROM Right eval Left eval Left 10/12/22 Left  10/13/22 Left 10/18/22   Hip flexion       Hip extension       Hip abduction       Hip adduction       Hip internal rotation       Hip external rotation       Knee flexion  80 102 100 108  Knee extension  6 -7 6 3   Ankle dorsiflexion       Ankle plantarflexion       Ankle inversion       Ankle eversion        (Blank rows = not tested)  LOWER EXTREMITY MMT:  MMT Right eval Left eval  Hip flexion 5 3-  Hip extension    Hip abduction 5 3  Hip adduction    Hip internal rotation    Hip external rotation    Knee flexion 5 3+  Knee extension 5 3  Ankle dorsiflexion 5 4  Ankle plantarflexion    Ankle inversion    Ankle eversion     (Blank rows = not tested)    FUNCTIONAL TESTS:  30 seconds chair stand test:  8  2 minute walk test: 226' with rolling walker  Single leg stance:  RT: 13:; LT: 0:     TODAY'S TREATMENT: 10/18/22 Seated heel slides STS no HHA Scar tissue massage Educated kinesio tape with edema control  Prone: contract relax 5x 10"  TKE 10x5" Supine: AROM 3-108 degrees  Standing: squat 10x   TKE- HEP   SLS Rt 8", LT   Sidestep GTB around thigh 2RT in hallway (~32ft)  10/13/22 Seated: heel slides with AAROM at end range  LAQ 10x 3" holds 2 sets  Standing: Heel raise 10x Toe raises 10x Hamstring curls 10x Squat 10x SLS Rt 7", Lt 0"  10/15/22 with SPC 176ft  Supine:  Heel slide SAQ 3# 2x 10 SLR 10 AROM 6-100 degrees  10/12/22 Review of HEP and goals Supine: Quad set 5" x 10 Heel slides x 10 Extension hang 2# x 2' SAQ's 2# 3 sec hold 2 x 10 Bridge 3# x 10  Standing: Heel raises 2 x 10 12" step knee drives left x 2' for flexion  TKE's green TB x 20         Evaluation:   Sitting:  LAQ x 10 Supine:  SLR x 10                Bridge x 10 Side lying hip abduction x 10    PATIENT EDUCATION:  Education details: Pt has HEP from [facility] doing standing and supine exercises.  Person educated: Patient Education method: Explanation Education comprehension:  verbalized understanding and has been completing    HOME EXERCISE PROGRAM: Home ex from [facility]:  Heel raises, squat, quad set, heel slide, supine hip abduction Access Code: [email or web address] URL: [email or web address] Date: 10/06/2022 Prepared by: 12/06/2022 10/18/22:  sidestep and SLS 10/13/22: Standing TKE   Exercises - Sidelying Pelvic Floor Contraction with Hip Abduction  - 2 x daily - 7 x weekly -  1 sets - 10 reps - 3" hold - Prone Knee Flexion AROM  - 2 x daily - 7 x weekly - 1 sets - 10 reps - 3" hold - Supine Bridge  - 2 x daily - 7 x weekly - 1 sets - 10 reps - 3" hold ASSESSMENT:  CLINICAL IMPRESSION: Pt shown scar tissue massage to address adhesions superior wound.  Added prone contract relax and prone TKE to address ROM deficits with positive results.  AROM improved to 3-108 degrees (was 6-100 last session).  Pt continues to show deficits with SLS, added sidestepping to HEP to address gluteal weakness to assist with balance, handout given and verbalized understanding with new exercise.  No reports of pain through session.     OBJECTIVE IMPAIRMENTS Abnormal gait, decreased activity tolerance, decreased balance, decreased mobility, difficulty walking, decreased ROM, decreased strength, increased edema, and pain.   ACTIVITY LIMITATIONS carrying, lifting, standing, squatting, stairs, and locomotion level  PARTICIPATION LIMITATIONS: cleaning, laundry, shopping, community activity, and yard work  PERSONAL FACTORS Fitness and 1-2 comorbidities: dm, sarcoidosis  are also affecting patient's functional outcome.   REHAB POTENTIAL: Good  CLINICAL DECISION MAKING: Stable/uncomplicated  EVALUATION COMPLEXITY: Moderate   GOALS: Goals reviewed with patient? Yes  SHORT TERM GOALS: Target date: 11/02/2022  Pt to be I in HEP in order to improve knee flexion to 110 to allow pt to squat to get items in lower cabinets.  Baseline:10/12/22 102; 10/19: Reports compliance iwht HEP  daily, AROM 6-100 degrees Goal status: IN PROGRESS  2.  Pt pain to be decreased to no greater than a 3/10 so pt can be standing/ walking for 30 minutes.  Baseline: 10/13/22:  Highest pain scale 3/10, able to walk with [facility] for 15 minutes Goal status: IN PROGRESS  3.  PT strength improved to 4/5 to allow pt to walk outside with a cane Baseline: 10/19 walking with SPC at home, no MMT complete this session Goal status: IN PROGRESS  4.  PT to be able to single leg stance with both LE for 10 "  Baseline: 10/13/22: Rt 7", Lt unable without HHA Goal status: IN PROGRESS   LONG TERM GOALS: Target date: 11/23/2022   Pt to be I in advanced HEP to increase knee flexion to 120 for improved mobility Baseline:  Goal status: IN PROGRESS  2.  PT pain to be no greater than a 1/10 to allow pt to be up standing/walking for an hour for shopping  Baseline:  Goal status: IN PROGRESS  3.  PT LE strength to be 4+/5 to be able to get up from the floor to be able to play with her grandchildren  Baseline:  Goal status: IN PROGRESS  4.  PT to be able to single leg stance for 15" each to feel secure in ambulation without an assistive device.  Baseline:  Goal status: IN PROGRESS  PT FREQUENCY: 2x/week  PT DURATION: 6 weeks  PLANNED INTERVENTIONS: Therapeutic exercises, Therapeutic activity, Balance training, Gait training, Patient/Family education, Self Care, and Manual therapy  PLAN FOR NEXT SESSION: focus on improving ROM then strength.  Add vector stance next session.  Becky Sax, LPTA/CLT; CBIS [phone]  12:56 PM, 10/18/22

## 2022-10-20 ENCOUNTER — Ambulatory Visit (HOSPITAL_COMMUNITY): Payer: Medicare Other | Admitting: Physical Therapy

## 2022-10-20 DIAGNOSIS — M25662 Stiffness of left knee, not elsewhere classified: Secondary | ICD-10-CM

## 2022-10-20 DIAGNOSIS — M25562 Pain in left knee: Secondary | ICD-10-CM

## 2022-10-20 DIAGNOSIS — R262 Difficulty in walking, not elsewhere classified: Secondary | ICD-10-CM | POA: Diagnosis not present

## 2022-10-20 NOTE — Therapy (Signed)
OUTPATIENT PHYSICAL THERAPY LOWER EXTREMITY TREATMENT   Patient Name: Terri Logan MRN: [id number] DOB:Mar 02, 1966, 56 y.o., female Today's Date: 10/20/2022   PT End of Session - 10/20/22 1347     Visit Number 5    Number of Visits 12    Date for PT Re-Evaluation 11/17/22    Authorization Type [employer] Medicare    Authorization Time Period no deductable, no VL, no auth    Progress Note Due on Visit 10    PT Start Time 1301    PT Stop Time 1344    PT Time Calculation (min) 43 min    Activity Tolerance Patient tolerated treatment well    Behavior During Therapy WFL for tasks assessed/performed               Past Medical History:  Diagnosis Date   Arthritis    Diabetes mellitus without complication ([facility])    GERD (gastroesophageal reflux disease)    Hypertension    Sarcoidosis    Sleep apnea    Past Surgical History:  Procedure Laterality Date   ABLATION  1993   CHOLECYSTECTOMY     15 years ago [facility]   DORSAL COMPARTMENT RELEASE Left 07/10/2015   Procedure: RELEASE DORSAL COMPARTMENT (DEQUERVAIN);  Surgeon: Vickki Hearing, MD;  Location: [facility];  Service: Orthopedics;  Laterality: Left;   KNEE ARTHROSCOPY WITH MEDIAL MENISECTOMY Right 10/20/2016   Procedure: KNEE ARTHROSCOPY WITH MEDIAL MENISECTOMY;  Surgeon: Vickki Hearing, MD;  Location: [facility];  Service: Orthopedics;  Laterality: Right;   ORIF ANKLE FRACTURE Right    TOTAL KNEE ARTHROPLASTY Right 11/05/2021   Procedure: RIGHT TOTAL KNEE ARTHROPLASTY;  Surgeon: Nadara Mustard, MD;  Location[facility];  Service: Orthopedics;  Laterality: Right;   TOTAL KNEE ARTHROPLASTY Left 08/31/2022   Procedure: LEFT TOTAL KNEE ARTHROPLASTY;  Surgeon: Nadara Mustard, MD;  Location: [facility];  Service: Orthopedics;  Laterality: Left;   Patient Active Problem List   Diagnosis Date Noted   DJD (degenerative joint disease) of knee 08/31/2022   Total knee replacement status, left 08/31/2022   Total knee replacement status, right  11/05/2021   Unilateral primary osteoarthritis, right knee    Medial meniscus, posterior horn derangement, right    De Quervain's syndrome (tenosynovitis)    DIABETES MELLITUS, UNCONTROLLED 07/22/2009   ANKLE, ARTHRITIS, DEGEN./OSTEO 08/21/2008   ADJUSTMENT DISORDER WITH DEPRESSED MOOD 08/13/2008   ALLERGIC RHINITIS 05/15/2008   LEG PAIN, CHRONIC 10/05/2007   LEUKOCYTOSIS 04/05/2007   SORE THROAT 04/05/2007   SARCOIDOSIS 01/29/2007   OBESITY, MORBID 01/29/2007   OBSTRUCTIVE SLEEP APNEA 01/29/2007   MIGRAINE HEADACHE 01/29/2007   HYPERTENSION 01/29/2007   OSTEOARTHRITIS 01/29/2007    PCP: Dr. Arturo Morton  REFERRING PROVIDER: Adonis Huguenin, NP Next apt:  11/14/22      REFERRING DIAG: LEFT TOTAL KNEE ARTHROPLASTY on 08/31/2022  THERAPY DIAG:  Difficulty in walking, stiffness of left knee, left knee pain,  mm weakness  Rationale for Evaluation and Treatment Rehabilitation  ONSET DATE: 08/31/2022  SUBJECTIVE:   SUBJECTIVE STATEMENT: PT states that she has not used her walker since Monday.  Pt walking with a small point cane.  Pt is no longer sitting in her shower and is going back to the gym on Nov. 1st.   PERTINENT HISTORY: DM, sarcoidosis, RT TKR  PAIN:  Are you having pain? Yes: NPRS scale: 0/10; worst pain has been a 6/10  Pain location: medial aspect of knee  Pain description: achy Aggravating factors: immobility  it becomes very stiff  Relieving factors: moving    PLOF: Independent  PATIENT GOALS less pain, to be able to move better    OBJECTIVE:    PATIENT SURVEYS:  FOTO 47   EDEMA: normal for time from op   LOWER EXTREMITY ROM:  Active ROM Right eval Left eval Left 10/12/22 Left  10/13/22 Left 10/18/22 Left  10/20/2022  Hip flexion        Hip extension        Hip abduction        Hip adduction        Hip internal rotation        Hip external rotation        Knee flexion  80 102 100 108 110  Knee extension  6 -7 6 3 2   Ankle dorsiflexion         Ankle plantarflexion        Ankle inversion        Ankle eversion         (Blank rows = not tested)  LOWER EXTREMITY MMT:  MMT Right eval Left eval  Hip flexion 5 3-  Hip extension    Hip abduction 5 3  Hip adduction    Hip internal rotation    Hip external rotation    Knee flexion 5 3+  Knee extension 5 3  Ankle dorsiflexion 5 4  Ankle plantarflexion    Ankle inversion    Ankle eversion     (Blank rows = not tested)    FUNCTIONAL TESTS:  30 seconds chair stand test:  8  2 minute walk test: 226' with rolling walker  Single leg stance:  RT: 13:; LT: 0:     TODAY'S TREATMENT: 10/26: Heel raises x 15 Squat x 15  Lt knee flexion x 15 Side stepping with green theraband in hall x 2 RT  LT step up 4" x 15 Lateral step up 4"  x 15 Forward lunge onto 6" step x 10  Vector stance x 5" 3 reps B with UE assist  Sit to stand x 15  Prone: Lt knee flexion x 10            contract relax to increase flexion            Terminal extension   10/18/22 Seated heel slides STS no HHA Scar tissue massage Educated kinesio tape with edema control  Prone: contract relax 5x 10"  TKE 10x5" Supine: AROM 3-108 degrees  Standing: squat 10x   TKE- HEP   SLS Rt 8", LT   Sidestep GTB around thigh 2RT in hallway (~34ft)  10/13/22 Seated: heel slides with AAROM at end range  LAQ 10x 3" holds 2 sets  Standing: Heel raise 10x Toe raises 10x Hamstring curls 10x Squat 10x SLS Rt 7", Lt 0"  10/15/22 with SPC 13ft  Supine:  Heel slide SAQ 3# 2x 10 SLR 10 AROM 6-100 degrees  10/12/22 Review of HEP and goals Supine: Quad set 5" x 10 Heel slides x 10 Extension hang 2# x 2' SAQ's 2# 3 sec hold 2 x 10 Bridge 3# x 10  Standing: Heel raises 2 x 10 12" step knee drives left x 2' for flexion  TKE's green TB x 20    PATIENT EDUCATION:  Education details: Pt has HEP from [facility] doing standing and supine exercises.  Person educated: Patient Education method:  Explanation Education comprehension: verbalized understanding and has been completing  HOME EXERCISE PROGRAM: 10/26: Access Code: [email or web address] URL: [email or web address] Date: 10/20/2022 Prepared by: Virgina Organ  Exercises  - Sit to Stand  - 1 x daily - 7 x weekly - 1 sets - 15 reps - Standing 3-Way Kick  - 1 x daily - 7 x weekly - 1 sets - 3 reps - 3-5:" hold - Heel Raises with Counter Support  - 1 x daily - 7 x weekly - 1 sets - 15 reps - 3-5" hold Home ex from [facility]:  Heel raises, squat, quad set, heel slide, supine hip abduction Access Code: [email or web address] URL: [email or web address] Date: 10/06/2022 Prepared by: Virgina Organ 10/18/22:  sidestep and SLS 10/13/22: Standing TKE   Exercises - Sidelying Pelvic Floor Contraction with Hip Abduction  - 2 x daily - 7 x weekly - 1 sets - 10 reps - 3" hold - Prone Knee Flexion AROM  - 2 x daily - 7 x weekly - 1 sets - 10 reps - 3" hold - Supine Bridge  - 2 x daily - 7 x weekly - 1 sets - 10 reps - 3" hold ASSESSMENT:  CLINICAL IMPRESSION: Pt continues to be challenged with balance  and activity tolerance.  ROM continues to slowly improve.  Therapist updated pt HEP.  PT will continue to benefit from skilled therapy.  OBJECTIVE IMPAIRMENTS Abnormal gait, decreased activity tolerance, decreased balance, decreased mobility, difficulty walking, decreased ROM, decreased strength, increased edema, and pain.   ACTIVITY LIMITATIONS carrying, lifting, standing, squatting, stairs, and locomotion level  PARTICIPATION LIMITATIONS: cleaning, laundry, shopping, community activity, and yard work  PERSONAL FACTORS Fitness and 1-2 comorbidities: dm, sarcoidosis  are also affecting patient's functional outcome.   REHAB POTENTIAL: Good  CLINICAL DECISION MAKING: Stable/uncomplicated  EVALUATION COMPLEXITY: Moderate   GOALS: Goals reviewed with patient? Yes  SHORT TERM GOALS: Target date: 11/02/2022  Pt to be I in HEP in  order to improve knee flexion to 110 to allow pt to squat to get items in lower cabinets.  Baseline:10/12/22 102; 10/19: Reports compliance iwht HEP daily, AROM 6-100 degrees Goal status: IN PROGRESS  2.  Pt pain to be decreased to no greater than a 3/10 so pt can be standing/ walking for 30 minutes.  Baseline: 10/13/22:  Highest pain scale 3/10, able to walk with [facility] for 15 minutes Goal status: IN PROGRESS  3.  PT strength improved to 4/5 to allow pt to walk outside with a cane Baseline: 10/19 walking with SPC at home, no MMT complete this session Goal status: IN PROGRESS  4.  PT to be able to single leg stance with both LE for 10 "  Baseline: 10/13/22: Rt 7", Lt unable without HHA Goal status: IN PROGRESS   LONG TERM GOALS: Target date: 11/23/2022   Pt to be I in advanced HEP to increase knee flexion to 120 for improved mobility Baseline:  Goal status: IN PROGRESS  2.  PT pain to be no greater than a 1/10 to allow pt to be up standing/walking for an hour for shopping  Baseline:  Goal status: IN PROGRESS  3.  PT LE strength to be 4+/5 to be able to get up from the floor to be able to play with her grandchildren  Baseline:  Goal status: IN PROGRESS  4.  PT to be able to single leg stance for 15" each to feel secure in ambulation without an assistive device.  Baseline:  Goal status: IN PROGRESS  PT FREQUENCY: 2x/week  PT DURATION: 6 weeks  PLANNED INTERVENTIONS: Therapeutic exercises, Therapeutic activity, Balance training, Gait training, Patient/Family education, Self Care, and Manual therapy  PLAN FOR NEXT SESSION: focus on improving strength and balance   . Rayetta Humphrey, [place] [phone]  [phone]

## 2022-10-25 ENCOUNTER — Ambulatory Visit (HOSPITAL_COMMUNITY): Payer: Medicare Other

## 2022-10-25 DIAGNOSIS — M25662 Stiffness of left knee, not elsewhere classified: Secondary | ICD-10-CM

## 2022-10-25 DIAGNOSIS — M1712 Unilateral primary osteoarthritis, left knee: Secondary | ICD-10-CM

## 2022-10-25 DIAGNOSIS — M25562 Pain in left knee: Secondary | ICD-10-CM

## 2022-10-25 DIAGNOSIS — R262 Difficulty in walking, not elsewhere classified: Secondary | ICD-10-CM

## 2022-10-25 DIAGNOSIS — R2689 Other abnormalities of gait and mobility: Secondary | ICD-10-CM

## 2022-10-25 NOTE — Therapy (Signed)
OUTPATIENT PHYSICAL THERAPY LOWER EXTREMITY TREATMENT   Patient Name: ALEKSANDRA RABEN MRN: [id number] DOB:1966/01/13, 56 y.o., female Today's Date: 10/25/2022   PT End of Session - 10/25/22 1404     Visit Number 6    Number of Visits 12    Date for PT Re-Evaluation 11/17/22    Authorization Type UHC Medicare    Authorization Time Period no deductable, no VL, no auth    Progress Note Due on Visit 10    PT Start Time 1355   late check in went to the wrong location   PT Stop Time 1430    PT Time Calculation (min) 35 min    Activity Tolerance Patient tolerated treatment well    Behavior During Therapy WFL for tasks assessed/performed                Past Medical History:  Diagnosis Date   Arthritis    Diabetes mellitus without complication ([place])    GERD (gastroesophageal reflux disease)    Hypertension    Sarcoidosis    Sleep apnea    Past Surgical History:  Procedure Laterality Date   ABLATION  1993   CHOLECYSTECTOMY     15 years ago [facility]   DORSAL COMPARTMENT RELEASE Left 07/10/2015   Procedure: RELEASE DORSAL COMPARTMENT (DEQUERVAIN);  Surgeon: Carole Civil, MD;  Location: [facility];  Service: Orthopedics;  Laterality: Left;   KNEE ARTHROSCOPY WITH MEDIAL MENISECTOMY Right 10/20/2016   Procedure: KNEE ARTHROSCOPY WITH MEDIAL MENISECTOMY;  Surgeon: Carole Civil, MD;  Location: [facility];  Service: Orthopedics;  Laterality: Right;   ORIF ANKLE FRACTURE Right    TOTAL KNEE ARTHROPLASTY Right 11/05/2021   Procedure: RIGHT TOTAL KNEE ARTHROPLASTY;  Surgeon: Newt Minion, MD;  Location: [place];  Service: Orthopedics;  Laterality: Right;   TOTAL KNEE ARTHROPLASTY Left 08/31/2022   Procedure: LEFT TOTAL KNEE ARTHROPLASTY;  Surgeon: Newt Minion, MD;  Location: [place];  Service: Orthopedics;  Laterality: Left;   Patient Active Problem List   Diagnosis Date Noted   DJD (degenerative joint disease) of knee 08/31/2022   Total knee replacement status, left 08/31/2022    Total knee replacement status, right 11/05/2021   Unilateral primary osteoarthritis, right knee    Medial meniscus, posterior horn derangement, right    De Quervain's syndrome (tenosynovitis)    DIABETES MELLITUS, UNCONTROLLED 07/22/2009   ANKLE, ARTHRITIS, DEGEN./OSTEO 08/21/2008   ADJUSTMENT DISORDER WITH DEPRESSED MOOD 08/13/2008   ALLERGIC RHINITIS 05/15/2008   LEG PAIN, CHRONIC 10/05/2007   LEUKOCYTOSIS 04/05/2007   SORE THROAT 04/05/2007   SARCOIDOSIS 01/29/2007   OBESITY, MORBID 01/29/2007   OBSTRUCTIVE SLEEP APNEA 01/29/2007   MIGRAINE HEADACHE 01/29/2007   HYPERTENSION 01/29/2007   OSTEOARTHRITIS 01/29/2007    PCP: Dr. Kari Baars  REFERRING PROVIDER: Suzan Slick, NP Next apt:  11/14/22      REFERRING DIAG: LEFT TOTAL KNEE ARTHROPLASTY on 08/31/2022  THERAPY DIAG:  Difficulty in walking, stiffness of left knee, left knee pain,  mm weakness  Rationale for Evaluation and Treatment Rehabilitation  ONSET DATE: 08/31/2022  SUBJECTIVE:   SUBJECTIVE STATEMENT: Patient doing well; highest pain the past week 2/10; using cane for ambulation now; starting gym back 11/2 PERTINENT HISTORY: DM, sarcoidosis, RT TKR  PAIN:  Are you having pain? Yes: NPRS scale: 0/10; worst pain has been a 6/10  Pain location: medial aspect of knee  Pain description: achy Aggravating factors: immobility it becomes very stiff  Relieving factors: moving    PLOF:  Independent  PATIENT GOALS less pain, to be able to move better    OBJECTIVE:    PATIENT SURVEYS:  FOTO 47   EDEMA: normal for time from op   LOWER EXTREMITY ROM:  Active ROM Right eval Left eval Left 10/12/22 Left  10/13/22 Left 10/18/22 Left  10/20/2022  Hip flexion        Hip extension        Hip abduction        Hip adduction        Hip internal rotation        Hip external rotation        Knee flexion  80 102 100 108 110  Knee extension  6 -_0 Ankle dorsiflexion        Ankle plantarflexion         Ankle inversion        Ankle eversion         (Blank rows = not tested)  LOWER EXTREMITY MMT:  MMT Right eval Left eval  Hip flexion 5 3-  Hip extension    Hip abduction 5 3  Hip adduction    Hip internal rotation    Hip external rotation    Knee flexion 5 3+  Knee extension 5 3  Ankle dorsiflexion 5 4  Ankle plantarflexion    Ankle inversion    Ankle eversion     (Blank rows = not tested)    FUNCTIONAL TESTS:  30 seconds chair stand test:  8  2 minute walk test: 226' with rolling walker  Single leg stance:  RT: 13:; LT: 0:     TODAY'S TREATMENT: 10/25/22 Octane [employer] x 5' dynamic warm up  30 sec sit to stand x 11 2MWT 326 ft with SPC SLS left 2-3 sec   Sitting Man knee flexion contract relax x 3  Gait training with SPC x 150 ft Standing knee drives on 8" box x 2'     10/26: Heel raises x 15 Squat x 15  Lt knee flexion x 15 Side stepping with green theraband in hall x 2 RT  LT step up 4" x 15 Lateral step up 4"  x 15 Forward lunge onto 6" step x 10  Vector stance x 5" 3 reps B with UE assist  Sit to stand x 15  Prone: Lt knee flexion x 10            contract relax to increase flexion            Terminal extension   10/18/22 Seated heel slides STS no HHA Scar tissue massage Educated kinesio tape with edema control  Prone: contract relax 5x 10"  TKE 10x5" Supine: AROM 3-108 degrees  Standing: squat 10x   TKE- HEP   SLS Rt 8", LT   Sidestep GTB around thigh 2RT in hallway (~67f)  10/13/22 Seated: heel slides with AAROM at end range  LAQ 10x 3" holds 2 sets  Standing: Heel raise 10x Toe raises 10x Hamstring curls 10x Squat 10x SLS Rt 7", Lt 0"  2MWT with SPC 1539f Supine:  Heel slide SAQ 3# 2x 10 SLR 10 AROM 6-100 degrees  10/12/22 Review of HEP and goals Supine: Quad set 5" x 10 Heel slides x 10 Extension hang 2# x 2' SAQ's 2# 3 sec hold 2 x 10 Bridge 3# x 10  Standing: Heel raises 2 x 10 12" step knee  drives left x 2' for  flexion  TKE's green TB x 20    PATIENT EDUCATION:  Education details: Pt has HEP from [facility] doing standing and supine exercises.  Person educated: Patient Education method: Explanation Education comprehension: verbalized understanding and has been completing    HOME EXERCISE PROGRAM: 10/26: Access Code: [email or web address] URL: [email or web address] Date: 10/20/2022 Prepared by: Rayetta Humphrey  Exercises  - Sit to Stand  - 1 x daily - 7 x weekly - 1 sets - 15 reps - Standing 3-Way Kick  - 1 x daily - 7 x weekly - 1 sets - 3 reps - 3-5:" hold - Heel Raises with Counter Support  - 1 x daily - 7 x weekly - 1 sets - 15 reps - 3-5" hold Home ex from [facility]:  Heel raises, squat, quad set, heel slide, supine hip abduction Access Code: [email or web address] URL: [email or web address] Date: 10/06/2022 Prepared by: Rayetta Humphrey 10/18/22:  sidestep and SLS 10/13/22: Standing TKE   Exercises - Sidelying Pelvic Floor Contraction with Hip Abduction  - 2 x daily - 7 x weekly - 1 sets - 10 reps - 3" hold - Prone Knee Flexion AROM  - 2 x daily - 7 x weekly - 1 sets - 10 reps - 3" hold - Supine Bridge  - 2 x daily - 7 x weekly - 1 sets - 10 reps - 3" hold ASSESSMENT:  CLINICAL IMPRESSION: Late check in today;patient went to the wrong treatment area initially.  Today's session focused on left knee mobility and strength, gait training.  Patient needs cues for improved heel strike and toe off and demonstrates decreased limp after instruction.  Good improvement with 2 MWT and 30 sec sit to stand. She is able to stand on her left leg 2-3 sec without upper extremity support today so has improved but still has not  attained goals for balance. Patient making good progress overall and is very motivated with PT.  Patient will continue to benefit from skilled therapy to address deficits and promote optimal function.   OBJECTIVE IMPAIRMENTS Abnormal gait, decreased activity  tolerance, decreased balance, decreased mobility, difficulty walking, decreased ROM, decreased strength, increased edema, and pain.   ACTIVITY LIMITATIONS carrying, lifting, standing, squatting, stairs, and locomotion level  PARTICIPATION LIMITATIONS: cleaning, laundry, shopping, community activity, and yard work  [place] and 1-2 comorbidities: dm, sarcoidosis  are also affecting patient's functional outcome.   REHAB POTENTIAL: Good  CLINICAL DECISION MAKING: Stable/uncomplicated  EVALUATION COMPLEXITY: Moderate   GOALS: Goals reviewed with patient? Yes  SHORT TERM GOALS: Target date: 11/02/2022  Pt to be I in HEP in order to improve knee flexion to 110 to allow pt to squat to get items in lower cabinets.  Baseline:10/12/22 102; 10/19: Reports compliance iwht HEP daily, AROM 6-100 degrees Goal status: IN PROGRESS  2.  Pt pain to be decreased to no greater than a 3/10 so pt can be standing/ walking for 30 minutes.  Baseline: 10/13/22:  Highest pain scale 3/10, able to walk with [facility] for 15 minute; 10/25/22 pain 2/10 at most past week Goal status: MET  3.  PT strength improved to 4/5 to allow pt to walk outside with a cane Baseline: 10/19 walking with SPC at home, no MMT complete this session Goal status: IN PROGRESS  4.  PT to be able to single leg stance with both LE for 10 "  Baseline: 10/13/22: Rt 7", Lt unable without HHA; 10/25/21 left 2-3 sec SLS no UE assist Goal status: IN PROGRESS  LONG TERM GOALS: Target date: 11/23/2022   Pt to be I in advanced HEP to increase knee flexion to 120 for improved mobility Baseline:  Goal status: IN PROGRESS  2.  PT pain to be no greater than a 1/10 to allow pt to be up standing/walking for an hour for shopping  Baseline:  Goal status: IN PROGRESS  3.  PT LE strength to be 4+/5 to be able to get up from the floor to be able to play with her grandchildren  Baseline:  Goal status: IN PROGRESS  4.  PT to be able to  single leg stance for 15" each to feel secure in ambulation without an assistive device.  Baseline:  Goal status: IN PROGRESS  PT FREQUENCY: 2x/week  PT DURATION: 6 weeks  PLANNED INTERVENTIONS: Therapeutic exercises, Therapeutic activity, Balance training, Gait training, Patient/Family education, Self Care, and Manual therapy  PLAN FOR NEXT SESSION: focus on improving strength and balance   .  2:35 PM, 10/25/22 Clydette Privitera Small Monti Villers MPT [facility] [place] [phone]

## 2022-10-27 ENCOUNTER — Encounter (HOSPITAL_COMMUNITY): Payer: Self-pay

## 2022-10-27 ENCOUNTER — Ambulatory Visit (HOSPITAL_COMMUNITY): Payer: Medicare Other | Attending: Family

## 2022-10-27 DIAGNOSIS — M25662 Stiffness of left knee, not elsewhere classified: Secondary | ICD-10-CM | POA: Insufficient documentation

## 2022-10-27 DIAGNOSIS — R262 Difficulty in walking, not elsewhere classified: Secondary | ICD-10-CM | POA: Insufficient documentation

## 2022-10-27 DIAGNOSIS — G8929 Other chronic pain: Secondary | ICD-10-CM | POA: Insufficient documentation

## 2022-10-27 DIAGNOSIS — R2689 Other abnormalities of gait and mobility: Secondary | ICD-10-CM | POA: Diagnosis present

## 2022-10-27 DIAGNOSIS — M25562 Pain in left knee: Secondary | ICD-10-CM | POA: Diagnosis present

## 2022-10-27 NOTE — Therapy (Signed)
OUTPATIENT PHYSICAL THERAPY LOWER EXTREMITY TREATMENT   Patient Name: Terri Logan MRN: [id number] DOB:[birth year], 56 y.o., female Today's Date: 10/27/2022   PT End of Session - 10/27/22 1415     Visit Number 7    Number of Visits 12    Date for PT Re-Evaluation 11/17/22    Authorization Type UHC Medicare    Authorization Time Period no deductable, no VL, no auth    Progress Note Due on Visit 10    PT Start Time 1351   late sign in   PT Stop Time 1430    PT Time Calculation (min) 39 min    Activity Tolerance Patient tolerated treatment well    Behavior During Therapy WFL for tasks assessed/performed                 Past Medical History:  Diagnosis Date   Arthritis    Diabetes mellitus without complication ([place])    GERD (gastroesophageal reflux disease)    Hypertension    Sarcoidosis    Sleep apnea    Past Surgical History:  Procedure Laterality Date   ABLATION  1993   CHOLECYSTECTOMY     15 years ago [facility]   DORSAL COMPARTMENT RELEASE Left 07/10/2015   Procedure: RELEASE DORSAL COMPARTMENT (DEQUERVAIN);  Surgeon: Carole Civil, MD;  Location[facility];  Service: Orthopedics;  Laterality: Left;   KNEE ARTHROSCOPY WITH MEDIAL MENISECTOMY Right 10/20/2016   Procedure: KNEE ARTHROSCOPY WITH MEDIAL MENISECTOMY;  Surgeon: Carole Civil, MD;  [facility];  Service: Orthopedics;  Laterality: Right;   ORIF ANKLE FRACTURE Right    TOTAL KNEE ARTHROPLASTY Right 11/05/2021   Procedure: RIGHT TOTAL KNEE ARTHROPLASTY;  Surgeon: Newt Minion, MD;  Location: [place];  Service: Orthopedics;  Laterality: Right;   TOTAL KNEE ARTHROPLASTY Left 08/31/2022   Procedure: LEFT TOTAL KNEE ARTHROPLASTY;  Surgeon: Newt Minion, MD;  Location: [place];  Service: Orthopedics;  Laterality: Left;   Patient Active Problem List   Diagnosis Date Noted   DJD (degenerative joint disease) of knee 08/31/2022   Total knee replacement status, left 08/31/2022   Total knee replacement  status, right 11/05/2021   Unilateral primary osteoarthritis, right knee    Medial meniscus, posterior horn derangement, right    De Quervain's syndrome (tenosynovitis)    DIABETES MELLITUS, UNCONTROLLED 07/22/2009   ANKLE, ARTHRITIS, DEGEN./OSTEO 08/21/2008   ADJUSTMENT DISORDER WITH DEPRESSED MOOD 08/13/2008   ALLERGIC RHINITIS 05/15/2008   LEG PAIN, CHRONIC 10/05/2007   LEUKOCYTOSIS 04/05/2007   SORE THROAT 04/05/2007   SARCOIDOSIS 01/29/2007   OBESITY, MORBID 01/29/2007   OBSTRUCTIVE SLEEP APNEA 01/29/2007   MIGRAINE HEADACHE 01/29/2007   HYPERTENSION 01/29/2007   OSTEOARTHRITIS 01/29/2007    PCP: Dr. Kari Baars  REFERRING PROVIDER: Suzan Slick, NP Next apt:  11/14/22      REFERRING DIAG: LEFT TOTAL KNEE ARTHROPLASTY on 08/31/2022  THERAPY DIAG:  Difficulty in walking, stiffness of left knee, left knee pain,  mm weakness  Rationale for Evaluation and Treatment Rehabilitation  ONSET DATE: 08/31/2022  SUBJECTIVE:   SUBJECTIVE STATEMENT: Pt stated she returned to the [facility] today, focus on arm exercises also rode bike at gym.  Felt good to be back. PERTINENT HISTORY: DM, sarcoidosis, RT TKR  PAIN:  Are you having pain? Yes: NPRS scale: 2/10. Pain location: medial aspect of knee  Pain description: achy Aggravating factors: immobility it becomes very stiff  Relieving factors: moving    PLOF: Independent  PATIENT GOALS less pain,  to be able to move better    OBJECTIVE:    PATIENT SURVEYS:  FOTO 47   EDEMA: normal for time from op   LOWER EXTREMITY ROM:  Active ROM Right eval Left eval Left 10/12/22 Left  10/13/22 Left 10/18/22 Left  10/20/2022  Hip flexion        Hip extension        Hip abduction        Hip adduction        Hip internal rotation        Hip external rotation        Knee flexion  80 102 100 108 110  Knee extension  6 -_0 Ankle dorsiflexion        Ankle plantarflexion        Ankle inversion        Ankle eversion          (Blank rows = not tested)  LOWER EXTREMITY MMT:  MMT Right eval Left eval  Hip flexion 5 3-  Hip extension    Hip abduction 5 3  Hip adduction    Hip internal rotation    Hip external rotation    Knee flexion 5 3+  Knee extension 5 3  Ankle dorsiflexion 5 4  Ankle plantarflexion    Ankle inversion    Ankle eversion     (Blank rows = not tested)    FUNCTIONAL TESTS:  30 seconds chair stand test:  8  2 minute walk test: 226' with rolling walker  Single leg stance:  RT: 13:; LT: 0:     TODAY'S TREATMENT: 10/27/22 Gait training with SPC, cueing for equal step length and heel to toe x 200 ft  Standing: heel/toe raises 20x with HHA  Squat 2x 10  Tandem stance 3x 30" last 2 sets on balance beam with intermittent HHA  TKE 10x 5" GTB  Vector stance with cueing for quad set to reduce knee buckling  SLS 2"max Prone: contract/ relax 5x 10"  Quad stretch with rope 3x 30"    10/25/22 Octane fitness x 5' dynamic warm up  30 sec sit to stand x 11 2MWT 326 ft with SPC SLS left 2-3 sec   Sitting Man knee flexion contract relax x 3  Gait training with SPC x 150 ft Standing knee drives on 8" box x 2'     10/26: Heel raises x 15 Squat x 15  Lt knee flexion x 15 Side stepping with green theraband in hall x 2 RT  LT step up 4" x 15 Lateral step up 4"  x 15 Forward lunge onto 6" step x 10  Vector stance x 5" 3 reps B with UE assist  Sit to stand x 15  Prone: Lt knee flexion x 10            contract relax to increase flexion            Terminal extension   10/18/22 Seated heel slides STS no HHA Scar tissue massage Educated kinesio tape with edema control  Prone: contract relax 5x 10"  TKE 10x5" Supine: AROM 3-108 degrees  Standing: squat 10x   TKE- HEP   SLS Rt 8", LT   Sidestep GTB around thigh 2RT in hallway (~74f)  10/13/22 Seated: heel slides with AAROM at end range  LAQ 10x 3" holds 2 sets  Standing: Heel raise 10x Toe raises 10x Hamstring  curls 10x Squat 10x SLS Rt  7", Lt 0"  2MWT with SPC 195f  Supine:  Heel slide SAQ 3# 2x 10 SLR 10 AROM 6-100 degrees  10/12/22 Review of HEP and goals Supine: Quad set 5" x 10 Heel slides x 10 Extension hang 2# x 2' SAQ's 2# 3 sec hold 2 x 10 Bridge 3# x 10  Standing: Heel raises 2 x 10 12" step knee drives left x 2' for flexion  TKE's green TB x 20    PATIENT EDUCATION:  Education details: [facility]  Person educated: Patient Education method: Explanation Education comprehension: verbalized understanding and has been completing    HOME EXERCISE PROGRAM: 10/26: Access Code: [email or web address] URL: [email or web address] Date: 10/20/2022 Prepared by: CRayetta Humphrey Exercises  - Sit to Stand  - 1 x daily - 7 x weekly - 1 sets - 15 reps - Standing 3-Way Kick  - 1 x daily - 7 x weekly - 1 sets - 3 reps - 3-5:" hold - Heel Raises with Counter Support  - 1 x daily - 7 x weekly - 1 sets - 15 reps - 3-5" hold [facility]  Heel raises, squat, quad set, heel slide, supine hip abduction Access Code: [email or web address] URL: [email or web address] Date: 10/06/2022 Prepared by: CRayetta Humphrey10/24/23:  sidestep and SLS 10/13/22: Standing TKE   Exercises - Sidelying Pelvic Floor Contraction with Hip Abduction  - 2 x daily - 7 x weekly - 1 sets - 10 reps - 3" hold - Prone Knee Flexion AROM  - 2 x daily - 7 x weekly - 1 sets - 10 reps - 3" hold - Supine Bridge  - 2 x daily - 7 x weekly - 1 sets - 10 reps - 3" hold ASSESSMENT:  CLINICAL IMPRESSION: [facility] and UE strengthening at [facility] until D/C from PT.  Sessoin focus on functional strength, ROM and balance.  Pt presents with knee buckling during vector stance today, encouraged to continue quad strengthening and balance exercises at home.  Unable to stand on Lt LE greater than 2 seconds today.   No reports of increased pain through session.    OBJECTIVE IMPAIRMENTS Abnormal gait, decreased activity tolerance, decreased balance, decreased mobility, difficulty walking, decreased ROM, decreased strength, increased edema, and pain.   ACTIVITY LIMITATIONS carrying, lifting, standing, squatting, stairs, and locomotion level  PARTICIPATION LIMITATIONS: cleaning, laundry, shopping, community activity, and yard work  [place]: dm, sarcoidosis  are also affecting patient's functional outcome.   REHAB POTENTIAL: Good  CLINICAL DECISION MAKING: Stable/uncomplicated  EVALUATION COMPLEXITY: Moderate   GOALS: Goals reviewed with patient? Yes  SHORT TERM GOALS: Target date: 11/02/2022  Pt to be I in HEP in order to improve knee flexion to 110 to allow pt to squat to get items in lower cabinets.  Baseline:10/12/22 102; 10/19: Reports compliance iwht HEP daily, AROM 6-100 degrees Goal status: IN PROGRESS  2.  Pt pain to be decreased to no greater than a 3/10 so pt can be standing/ walking for 30 minutes.  Baseline: 10/13/22:  Highest pain scale 3/10, [facility]; 10/25/22 pain 2/10 at most past week Goal status: MET  3.  PT strength improved to 4/5 to allow pt to walk outside with a cane Baseline: 10/19 walking with SPC at home, no MMT complete this session Goal status: IN PROGRESS  4.  PT  to be able to single leg stance with both LE for 10 "  Baseline: 10/13/22: Rt 7", Lt unable without HHA; 10/25/21 left 2-3 sec SLS no UE assist Goal status: IN PROGRESS   LONG TERM GOALS: Target date: 11/23/2022   Pt to be I in advanced HEP to increase knee flexion to 120 for improved mobility Baseline:  Goal status: IN PROGRESS  2.  PT pain to be no greater than a 1/10 to allow pt to be up standing/walking for an hour for shopping  Baseline:  Goal status: IN PROGRESS  3.  PT LE strength to be 4+/5 to be able to get up from the  floor to be able to play with her grandchildren  Baseline:  Goal status: IN PROGRESS  4.  PT to be able to single leg stance for 15" each to feel secure in ambulation without an assistive device.  Baseline:  Goal status: IN PROGRESS  PT FREQUENCY: 2x/week  PT DURATION: 6 weeks  PLANNED INTERVENTIONS: Therapeutic exercises, Therapeutic activity, Balance training, Gait training, Patient/Family education, Self Care, and Manual therapy  PLAN FOR NEXT SESSION: focus on improving strength and balance   .  Ihor Austin, LPTA/CLT; Delana Meyer [phone] 3:31 PM, 10/27/22

## 2022-11-01 ENCOUNTER — Ambulatory Visit (HOSPITAL_COMMUNITY): Payer: Medicare Other | Admitting: Physical Therapy

## 2022-11-01 DIAGNOSIS — R262 Difficulty in walking, not elsewhere classified: Secondary | ICD-10-CM

## 2022-11-01 DIAGNOSIS — M25662 Stiffness of left knee, not elsewhere classified: Secondary | ICD-10-CM

## 2022-11-01 DIAGNOSIS — M25562 Pain in left knee: Secondary | ICD-10-CM

## 2022-11-01 NOTE — Therapy (Signed)
OUTPATIENT PHYSICAL THERAPY LOWER EXTREMITY TREATMENT   Patient Name: ANYI FELS MRN: [id number] DOB:07/05/1966, 56 y.o., female Today's Date: 11/01/2022   PT End of Session - 11/01/22 1402     Visit Number 8    Number of Visits 12    Date for PT Re-Evaluation 11/17/22    Authorization Type UHC Medicare    Authorization Time Period no deductable, no VL, no auth    Progress Note Due on Visit 10    PT Start Time 1356    PT Stop Time 1430    PT Time Calculation (min) 34 min    Activity Tolerance Patient tolerated treatment well    Behavior During Therapy WFL for tasks assessed/performed                 Past Medical History:  Diagnosis Date   Arthritis    Diabetes mellitus without complication ([place])    GERD (gastroesophageal reflux disease)    Hypertension    Sarcoidosis    Sleep apnea    Past Surgical History:  Procedure Laterality Date   ABLATION  1993   CHOLECYSTECTOMY     15 years ago [facility]   DORSAL COMPARTMENT RELEASE Left 07/10/2015   Procedure: RELEASE DORSAL COMPARTMENT (DEQUERVAIN);  Surgeon: Carole Civil, MD;  Location: [facility];  Service: Orthopedics;  Laterality: Left;   KNEE ARTHROSCOPY WITH MEDIAL MENISECTOMY Right 10/20/2016   Procedure: KNEE ARTHROSCOPY WITH MEDIAL MENISECTOMY;  Surgeon: Carole Civil, MD;  Location: [facility];  Service: Orthopedics;  Laterality: Right;   ORIF ANKLE FRACTURE Right    TOTAL KNEE ARTHROPLASTY Right 11/05/2021   Procedure: RIGHT TOTAL KNEE ARTHROPLASTY;  Surgeon: Newt Minion, MD;  Location: [place];  Service: Orthopedics;  Laterality: Right;   TOTAL KNEE ARTHROPLASTY Left 08/31/2022   Procedure: LEFT TOTAL KNEE ARTHROPLASTY;  Surgeon: Newt Minion, MD;  Location: [place];  Service: Orthopedics;  Laterality: Left;   Patient Active Problem List   Diagnosis Date Noted   DJD (degenerative joint disease) of knee 08/31/2022   Total knee replacement status, left 08/31/2022   Total knee replacement status, right  11/05/2021   Unilateral primary osteoarthritis, right knee    Medial meniscus, posterior horn derangement, right    De Quervain's syndrome (tenosynovitis)    DIABETES MELLITUS, UNCONTROLLED 07/22/2009   ANKLE, ARTHRITIS, DEGEN./OSTEO 08/21/2008   ADJUSTMENT DISORDER WITH DEPRESSED MOOD 08/13/2008   ALLERGIC RHINITIS 05/15/2008   LEG PAIN, CHRONIC 10/05/2007   LEUKOCYTOSIS 04/05/2007   SORE THROAT 04/05/2007   SARCOIDOSIS 01/29/2007   OBESITY, MORBID 01/29/2007   OBSTRUCTIVE SLEEP APNEA 01/29/2007   MIGRAINE HEADACHE 01/29/2007   HYPERTENSION 01/29/2007   OSTEOARTHRITIS 01/29/2007    PCP: Dr. Kari Baars  REFERRING PROVIDER: Suzan Slick, NP Next apt:  11/14/22      REFERRING DIAG: LEFT TOTAL KNEE ARTHROPLASTY on 08/31/2022  THERAPY DIAG:  Difficulty in walking, stiffness of left knee, left knee pain,  mm weakness  Rationale for Evaluation and Treatment Rehabilitation  ONSET DATE: 08/31/2022  SUBJECTIVE:   SUBJECTIVE STATEMENT: Pt was late for session today.  States she had a workout this morning for 45 minutes with her personal trainer at the [facility].  Reports she goes 2X week.  Came up today on wheelchair.  Having 2/10 pain in Lt knee.    PERTINENT HISTORY: Lt TKR 08/31/22, DM, sarcoidosis, RT TKR 11/05/2021  PAIN:  Are you having pain? Yes: NPRS scale: 2/10. Pain location: medial aspect of knee  Pain  description: achy Aggravating factors: immobility it becomes very stiff  Relieving factors: moving    PLOF: Independent  PATIENT GOALS less pain, to be able to move better    OBJECTIVE:    PATIENT SURVEYS:  FOTO 47   EDEMA: normal for time from op   LOWER EXTREMITY ROM:  Active ROM Right eval Left eval Left 10/12/22 Left  10/13/22 Left 10/18/22 Left  10/20/2022  Hip flexion        Hip extension        Hip abduction        Hip adduction        Hip internal rotation        Hip external rotation        Knee flexion  80 102 100 108 110  Knee extension  6  -_0 Ankle dorsiflexion        Ankle plantarflexion        Ankle inversion        Ankle eversion         (Blank rows = not tested)  LOWER EXTREMITY MMT:  MMT Right eval Left eval  Hip flexion 5 3-  Hip extension    Hip abduction 5 3  Hip adduction    Hip internal rotation    Hip external rotation    Knee flexion 5 3+  Knee extension 5 3  Ankle dorsiflexion 5 4  Ankle plantarflexion    Ankle inversion    Ankle eversion     (Blank rows = not tested)  FUNCTIONAL TESTS:  30 seconds chair stand test:  8  2 minute walk test: 226' with rolling walker  Single leg stance:  RT: 13:; LT: 0:  TODAY'S TREATMENT:  11/01/2022 Gait using SPC Standing:  heelraises 20X  Toeraise 20X  Tandem stance on balance beam 3X30" each LE lead  SLS each LE max of 2" Lt, 7" Rt no UE  Vector stance x 5" 3 reps B with 1UE assist Rt, 2 UE Lt   Step ups forward 4" with 1 UE assist 10X each  10/27/22 Gait training with SPC, cueing for equal step length and heel to toe x 200 ft  Standing: heel/toe raises 20x with HHA  Squat 2x 10  Tandem stance 3x 30" last 2 sets on balance beam with intermittent HHA  TKE 10x 5" GTB  Vector stance with cueing for quad set to reduce knee buckling  SLS 2"max Prone: contract/ relax 5x 10"  Quad stretch with rope 3x 30"    10/25/22 [employer] x 5' dynamic warm up  30 sec sit to stand x 11 2MWT 326 ft with SPC SLS left 2-3 sec   Sitting Man knee flexion contract relax x 3  Gait training with SPC x 150 ft Standing knee drives on 8" box x 2'     10/26: Heel raises x 15 Squat x 15  Lt knee flexion x 15 Side stepping with green theraband in hall x 2 RT  LT step up 4" x 15 Lateral step up 4"  x 15 Forward lunge onto 6" step x 10  Vector stance x 5" 3 reps B with 1UE assist Rt, 2 UE Lt  Sit to stand x 15  Prone: Lt knee flexion x 10            contract relax to increase flexion            Terminal extension    10/18/22  Seated heel  slides STS no HHA Scar tissue massage Educated kinesio tape with edema control  Prone: contract relax 5x 10"  TKE 10x5" Supine: AROM 3-108 degrees  Standing: squat 10x   TKE- HEP   SLS Rt 8", LT   Sidestep GTB around thigh 2RT in hallway (~79f)  10/13/22 Seated: heel slides with AAROM at end range  LAQ 10x 3" holds 2 sets  Standing: Heel raise 10x Toe raises 10x Hamstring curls 10x Squat 10x SLS Rt 7", Lt 0"  2MWT with SPC 159f Supine:  Heel slide SAQ 3# 2x 10 SLR 10 AROM 6-100 degrees  10/12/22 Review of HEP and goals Supine: Quad set 5" x 10 Heel slides x 10 Extension hang 2# x 2' SAQ's 2# 3 sec hold 2 x 10 Bridge 3# x 10  Standing: Heel raises 2 x 10 12" step knee drives left x 2' for flexion  TKE's green TB x 20    PATIENT EDUCATION:  Education details: [facility]  Person educated: Patient Education method: Explanation Education comprehension: verbalized understanding and has been completing    HOME EXERCISE PROGRAM: 10/26: Access Code: [email or web address] URL: [email or web address] Date: 10/20/2022 Prepared by: CyRayetta HumphreyExercises  - Sit to Stand  - 1 x daily - 7 x weekly - 1 sets - 15 reps - Standing 3-Way Kick  - 1 x daily - 7 x weekly - 1 sets - 3 reps - 3-5:" hold - Heel Raises with Counter Support  - 1 x daily - 7 x weekly - 1 sets - 15 reps - 3-5" hold [facility] Heel raises, squat, quad set, heel slide, supine hip abduction Access Code: [email or web address] URL: [email or web address] Date: 10/06/2022 Prepared by: CyRayetta Humphrey0/24/23:  sidestep and SLS 10/13/22: Standing TKE   Exercises - Sidelying Pelvic Floor Contraction with Hip Abduction  - 2 x daily - 7 x weekly - 1 sets - 10 reps - 3" hold - Prone Knee Flexion AROM  - 2 x daily - 7 x weekly - 1 sets - 10 reps - 3" hold - Supine Bridge  - 2 x daily - 7 x weekly - 1 sets - 10 reps - 3"  hold ASSESSMENT:  CLINICAL IMPRESSION: [facility]. Increased focus on strength and stability for LE today rather than ROM as she is making steady gains at 2-110 last visit.  Most challenged with balance with inability to maintain SLS on Lt greater than 2 seconds.  Continued with vectors with challenge completing due to weakness.  Completed 5 reps on each LE with need to use bil UE assist while standing on Lt LE.  Unable to complete full session today due to time.  No pain or issues, will continue to benefit from skilled therapy to progress towards goals.     OBJECTIVE IMPAIRMENTS Abnormal gait, decreased activity tolerance, decreased balance, decreased mobility, difficulty walking, decreased ROM, decreased strength, increased edema, and pain.   ACTIVITY LIMITATIONS carrying, lifting, standing, squatting, stairs, and locomotion level  PARTICIPATION LIMITATIONS: cleaning, laundry, shopping, community activity, and yard work  [place] comorbidities: dm, sarcoidosis  are also affecting patient's functional outcome.   REHAB POTENTIAL: Good  CLINICAL DECISION MAKING: Stable/uncomplicated  EVALUATION COMPLEXITY: Moderate   GOALS: Goals reviewed with patient? Yes  SHORT TERM GOALS: Target date: 11/02/2022  Pt to be I in HEP  in order to improve knee flexion to 110 to allow pt to squat to get items in lower cabinets.  Baseline:10/12/22 102; 10/19: Reports compliance iwht HEP daily, AROM 6-100 degrees Goal status: IN PROGRESS  2.  Pt pain to be decreased to no greater than a 3/10 so pt can be standing/ walking for 30 minutes.  Baseline: 10/13/22:  Highest pain scale 3/10, able to walk with [facility] for 15 minute; 10/25/22 pain 2/10 at most past week Goal status: MET  3.  PT strength improved to 4/5 to allow pt to walk outside with a cane Baseline: 10/19 walking with SPC at home, no MMT complete this session Goal  status: IN PROGRESS  4.  PT to be able to single leg stance with both LE for 10 "  Baseline: 10/13/22: Rt 7", Lt unable without HHA; 10/25/21 left 2-3 sec SLS no UE assist Goal status: IN PROGRESS   LONG TERM GOALS: Target date: 11/23/2022   Pt to be I in advanced HEP to increase knee flexion to 120 for improved mobility Baseline:  Goal status: IN PROGRESS  2.  PT pain to be no greater than a 1/10 to allow pt to be up standing/walking for an hour for shopping  Baseline:  Goal status: IN PROGRESS  3.  PT LE strength to be 4+/5 to be able to get up from the floor to be able to play with her grandchildren  Baseline:  Goal status: IN PROGRESS  4.  PT to be able to single leg stance for 15" each to feel secure in ambulation without an assistive device.  Baseline:  Goal status: IN PROGRESS  PT FREQUENCY: 2x/week  PT DURATION: 6 weeks  PLANNED INTERVENTIONS: Therapeutic exercises, Therapeutic activity, Balance training, Gait training, Patient/Family education, Self Care, and Manual therapy  PLAN FOR NEXT SESSION: focus on improving strength and balance.   Teena Irani, PTA/CLT [place] Ph: [phone]  2:32 PM, 11/01/22

## 2022-11-03 ENCOUNTER — Ambulatory Visit (HOSPITAL_COMMUNITY): Payer: Medicare Other | Admitting: Physical Therapy

## 2022-11-03 DIAGNOSIS — R262 Difficulty in walking, not elsewhere classified: Secondary | ICD-10-CM

## 2022-11-03 DIAGNOSIS — M25662 Stiffness of left knee, not elsewhere classified: Secondary | ICD-10-CM

## 2022-11-03 DIAGNOSIS — M25562 Pain in left knee: Secondary | ICD-10-CM

## 2022-11-03 NOTE — Therapy (Signed)
OUTPATIENT PHYSICAL THERAPY LOWER EXTREMITY TREatment   Patient Name: Terri Logan MRN: [id number] DOB:23-Mar-1966, 56 y.o., female Today's Date: 11/03/2022 PHYSICAL THERAPY DISCHARGE SUMMARY  Visits from Start of Care: 9  Current functional level related to goals / functional outcomes: See below   Remaining deficits: Balance which pt will work on at home   Education / Equipment: HEP   Patient agrees to discharge. Patient goals were partially met. Patient is being discharged due to being pleased with the current functional level.   PT End of Session - 11/03/22 1431     Visit Number 9    Number of Visits 9    Date for PT Re-Evaluation 11/17/22    Authorization Type [employer]HC Medicare    Authorization Time Period no deductable, no VL, no auth    Progress Note Due on Visit 10    PT Start Time 1358    PT Stop Time 1430    PT Time Calculation (min) 32 min    Activity Tolerance Patient tolerated treatment well    Behavior During Therapy WFL for tasks assessed/performed                    Past Medical History:  Diagnosis Date   Arthritis    Diabetes mellitus without complication ([place])    GERD (gastroesophageal reflux disease)    Hypertension    Sarcoidosis    Sleep apnea    Past Surgical History:  Procedure Laterality Date   ABLATION  1993   CHOLECYSTECTOMY     15 years ago [facility]   DORSAL COMPARTMENT RELEASE Left 07/10/2015   Procedure: RELEASE DORSAL COMPARTMENT (DEQUERVAIN);  Surgeon: Carole Civil, MD;  Location: [facility];  Service: Orthopedics;  Laterality: Left;   KNEE ARTHROSCOPY WITH MEDIAL MENISECTOMY Right 10/20/2016   Procedure: KNEE ARTHROSCOPY WITH MEDIAL MENISECTOMY;  Surgeon: Carole Civil, MD;  Location[facility];  Service: Orthopedics;  Laterality: Right;   ORIF ANKLE FRACTURE Right    TOTAL KNEE ARTHROPLASTY Right 11/05/2021   Procedure: RIGHT TOTAL KNEE ARTHROPLASTY;  Surgeon: Newt Minion, MD;  Location: [place];  Service: Orthopedics;   Laterality: Right;   TOTAL KNEE ARTHROPLASTY Left 08/31/2022   Procedure: LEFT TOTAL KNEE ARTHROPLASTY;  Surgeon: Newt Minion, MD;  Location: [place];  Service: Orthopedics;  Laterality: Left;   Patient Active Problem List   Diagnosis Date Noted   DJD (degenerative joint disease) of knee 08/31/2022   Total knee replacement status, left 08/31/2022   Total knee replacement status, right 11/05/2021   Unilateral primary osteoarthritis, right knee    Medial meniscus, posterior horn derangement, right    De Quervain's syndrome (tenosynovitis)    DIABETES MELLITUS, UNCONTROLLED 07/22/2009   ANKLE, ARTHRITIS, DEGEN./OSTEO 08/21/2008   ADJUSTMENT DISORDER WITH DEPRESSED MOOD 08/13/2008   ALLERGIC RHINITIS 05/15/2008   LEG PAIN, CHRONIC 10/05/2007   LEUKOCYTOSIS 04/05/2007   SORE THROAT 04/05/2007   SARCOIDOSIS 01/29/2007   OBESITY, MORBID 01/29/2007   OBSTRUCTIVE SLEEP APNEA 01/29/2007   MIGRAINE HEADACHE 01/29/2007   HYPERTENSION 01/29/2007   OSTEOARTHRITIS 01/29/2007    PCP: Dr. Kari Baars  REFERRING PROVIDER: Suzan Slick, NP Next apt:  11/14/22      REFERRING DIAG: LEFT TOTAL KNEE ARTHROPLASTY on 08/31/2022  THERAPY DIAG:  Difficulty in walking, stiffness of left knee, left knee pain,  mm weakness  Rationale for Evaluation and Treatment Rehabilitation  ONSET DATE: 08/31/2022  SUBJECTIVE:   SUBJECTIVE STATEMENT: Pt was late for session today.  Pt states that she goes without her cane in her home about 60%.  PERTINENT HISTORY: Lt TKR 08/31/22, DM, sarcoidosis, RT TKR 11/05/2021  PAIN:  Are you having pain? Yes: NPRS scale: 2/10. Pain location: medial aspect of knee  Pain description: achy Aggravating factors: immobility it becomes very stiff  Relieving factors: moving    PLOF: Independent  PATIENT GOALS less pain, to be able to move better    OBJECTIVE:    PATIENT SURVEYS:  FOTO 47 Nov 9:  69   EDEMA: normal for time from op   LOWER EXTREMITY ROM:  Active  ROM Right eval Left eval Left 10/12/22 Left  10/13/22 Left 10/18/22 Left  10/20/2022 Left  11/03/22   Hip flexion         Hip extension         Hip abduction         Hip adduction         Hip internal rotation         Hip external rotation         Knee flexion 110 80 102 100 108 110 110  Knee extension  6 -_0 0  Ankle dorsiflexion         Ankle plantarflexion         Ankle inversion         Ankle eversion          (Blank rows = not tested)  LOWER EXTREMITY MMT:  MMT Right eval Left eval Left  11/03/2022  Hip flexion 5 3- 4  Hip extension     Hip abduction _1 Hip adduction     Hip internal rotation     Hip external rotation     Knee flexion 5 3+   Knee extension _2 Ankle dorsiflexion _3 Ankle plantarflexion     Ankle inversion     Ankle eversion      (Blank rows = not tested)  FUNCTIONAL TESTS:  30 seconds chair stand test:  8 ;  11/9:  12 2 minute walk test: 226' with rolling walker ;11/9:  335' with cane:   Single leg stance:  RT: 13:; LT: 0:;  11/9:  RT 10: LT 4"  TODAY'S TREATMENT: Re-assessment 11/9:    Heel slides x 10  SLR x 10  Sit to stand x 10  GT with cane   Practicing getting in and off the floor I.   11/01/2022 Gait using [place] Standing:  heelraises 20X  Toeraise 20X  Tandem stance on balance beam 3X30" each LE lead  SLS each LE max of 2" Lt, 7" Rt no UE  Vector stance x 5" 3 reps B with 1UE assist Rt, 2 UE Lt   Step ups forward 4" with 1 UE assist 10X each  10/27/22 Gait training with SPC, cueing for equal step length and heel to toe x 200 ft  Standing: heel/toe raises 20x with HHA  Squat 2x 10  Tandem stance 3x 30" last 2 sets on balance beam with intermittent HHA  TKE 10x 5" GTB  Vector stance with cueing for quad set to reduce knee buckling  SLS 2"max Prone: contract/ relax 5x 10"  Quad stretch with rope 3x 30"    10/25/22 [employer] x 5' dynamic warm up  30 sec sit to stand x 11 2MWT 326 ft with  SPC SLS left 2-3 sec   Sitting Man knee flexion  contract relax x 3  Gait training with SPC x 150 ft Standing knee drives on 8" box x 2'     10/26: Heel raises x 15 Squat x 15  Lt knee flexion x 15 Side stepping with green theraband in hall x 2 RT  LT step up 4" x 15 Lateral step up 4"  x 15 Forward lunge onto 6" step x 10  Vector stance x 5" 3 reps B with 1UE assist Rt, 2 UE Lt  Sit to stand x 15  Prone: Lt knee flexion x 10            contract relax to increase flexion            Terminal extension    10/18/22 Seated heel slides STS no HHA Scar tissue massage Educated kinesio tape with edema control  Prone: contract relax 5x 10"  TKE 10x5" Supine: AROM 3-108 degrees  Standing: squat 10x   TKE- HEP   SLS Rt 8", LT   Sidestep GTB around thigh 2RT in hallway (~25f)     PATIENT EDUCATION:  Education details: [facility]  Person educated: Patient Education method: Explanation Education comprehension: verbalized understanding and has been completing    HOME EXERCISE PROGRAM: 10/26: Access Code: [email or web address] URL: [email or web address] Date: 10/20/2022 Prepared by: CRayetta Humphrey Exercises  - Sit to Stand  - 1 x daily - 7 x weekly - 1 sets - 15 reps - Standing 3-Way Kick  - 1 x daily - 7 x weekly - 1 sets - 3 reps - 3-5:" hold - Heel Raises with Counter Support  - 1 x daily - 7 x weekly - 1 sets - 15 reps - 3-5" hold [facility]  Heel raises, squat, quad set, heel slide, supine hip abduction Access Code: [email or web address] URL: [email or web address] Date: 10/06/2022 Prepared by: CRayetta Humphrey10/24/23:  sidestep and SLS 10/13/22: Standing TKE   Exercises - Sidelying Pelvic Floor Contraction with Hip Abduction  - 2 x daily - 7 x weekly - 1 sets - 10 reps - 3" hold - Prone Knee Flexion AROM  - 2 x daily - 7 x weekly - 1 sets - 10 reps - 3" hold - Supine Bridge  - 2 x daily - 7 x weekly  - 1 sets - 10 reps - 3" hold ASSESSMENT:  CLINICAL IMPRESSION: Pt continues to 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  Pt knows that she still needs to work on her balance and will continue to do so at home.  PT states that there is nothing at home that she can not do at this time.   OBJECTIVE IMPAIRMENTS Abnormal gait, decreased activity tolerance, decreased balance, decreased mobility, difficulty walking, decreased ROM, decreased strength, increased edema, and pain.   ACTIVITY LIMITATIONS carrying, lifting, standing, squatting, stairs, and locomotion level  PARTICIPATION LIMITATIONS: cleaning, laundry, shopping, community activity, and yard work  [place] 1[place] comorbidities: dm, sarcoidosis  are also affecting patient's functional outcome.   REHAB POTENTIAL: Good  CLINICAL DECISION MAKING: Stable/uncomplicated  EVALUATION COMPLEXITY: Moderate   GOALS: Goals reviewed with patient? Yes  SHORT TERM GOALS: Target date: 11/02/2022  Pt to be I in HEP in order to improve knee flexion to 110 to allow pt to squat to get items in lower cabinets.  Baseline:10/12/22 102; 10/19: Reports compliance iwht HEP daily, AROM 6-100 degrees Goal  status: MET  2.  Pt pain to be decreased to no greater than a 3/10 so pt can be standing/ walking for 30 minutes.  Baseline: 10/13/22:  Highest pain scale 3/10, able to walk with [facility] for 15 minute; 10/25/22 pain 2/10 at most past week Goal status: MET  3.  PT strength improved to 4/5 to allow pt to walk outside with a cane Baseline: 10/19 walking with SPC at home, no MMT complete this session Goal status: IN PROGRESS  4.  PT to be able to single leg stance with both LE for 10 "  Baseline: 10/13/22: Rt 7", Lt unable without HHA; 10/25/21 left 2-3 sec SLS no UE assist Goal status: IN PROGRESS   LONG TERM GOALS: Target date: 11/23/2022   Pt to be I in advanced HEP to increase knee flexion to  120 for improved mobility Baseline:  Goal status: REVISED only able to flex to 110 on her RT   2.  PT pain to be no greater than a 1/10 to allow pt to be up standing/walking for an hour for shopping  Baseline:  Goal status: IN PROGRESS- 2/10 on 11/9  3.  PT LE strength to be 4+/5 to be able to get up from the floor to be able to play with her grandchildren  Baseline:  Goal status: MET able to get up and down off the floor.   4.  PT to be able to single leg stance for 15" each to feel secure in ambulation without an assistive device.  Baseline:  Goal status: IN PROGRESS; 11/9: Rt 10" ; Lt 5 "   PT FREQUENCY: 2x/week  PT DURATION: 6 weeks  PLANNED INTERVENTIONS: Therapeutic exercises, Therapeutic activity, Balance training, Gait training, Patient/Family education, Self Care, and Manual therapy  PLAN FOR NEXT SESSION: Discharge.  Rayetta Humphrey, [place] CLT [phone]

## 2022-11-08 ENCOUNTER — Encounter (HOSPITAL_COMMUNITY): Payer: Medicare Other

## 2022-11-10 ENCOUNTER — Encounter (HOSPITAL_COMMUNITY): Payer: Medicare Other | Admitting: Physical Therapy

## 2022-11-14 ENCOUNTER — Ambulatory Visit (INDEPENDENT_AMBULATORY_CARE_PROVIDER_SITE_OTHER): Payer: Medicare Other | Admitting: Orthopedic Surgery

## 2022-11-14 ENCOUNTER — Encounter: Payer: Self-pay | Admitting: Orthopedic Surgery

## 2022-11-14 VITALS — Ht 67.0 in | Wt 342.0 lb

## 2022-11-14 DIAGNOSIS — Z96652 Presence of left artificial knee joint: Secondary | ICD-10-CM

## 2022-11-14 DIAGNOSIS — Z96651 Presence of right artificial knee joint: Secondary | ICD-10-CM

## 2022-11-23 ENCOUNTER — Other Ambulatory Visit: Payer: Self-pay | Admitting: Family

## 2022-11-23 MED ORDER — OXYCODONE-ACETAMINOPHEN 5-325 MG PO TABS: 1.0000 | ORAL_TABLET | Freq: Two times a day (BID) | ORAL | 0 refills | Status: AC | PRN

## 2022-11-23 NOTE — Telephone Encounter (Signed)
From: [name] To: Adonis Huguenin, NP Sent: 11/16/2022 10:59 AM EST Subject: Medication Renewal Request  Refills have been requested for the following medications:   oxyCODONE-acetaminophen (PERCOCET/ROXICET) 5-325 MG tablet [Maebel Marasco R Dorsie Burich]  Preferred pharmacy: [id number] - [place], [place] - [address] AT [facility] [address] Delivery method: [employer]

## 2022-11-29 ENCOUNTER — Encounter: Payer: Self-pay | Admitting: Orthopedic Surgery

## 2022-11-29 NOTE — Progress Notes (Signed)
Office Visit Note   Patient: Terri Logan           Date of Birth: 02/03/1966           MRN: [id number] Visit Date: 11/14/2022              Requested by: Roe Rutherford, NP [address] [place],  [place] [address] PCP: Roe Rutherford, NP  Chief Complaint  Patient presents with   Left Knee - Routine Post Op, Follow-up    08/31/2022 Left TKA      HPI: Patient is a 56 year old woman who is 2 and half months status post left total knee arthroplasty.  She is doing scar massage.  She states she has some pins and needle pain secondary to swelling going down the back of her leg.  She states she has a history of diabetic neuropathy she has completed her physical therapy and is working at the gym 2 times a week.  Assessment & Plan: Visit Diagnoses:  1. Total knee replacement status, right   2. Total knee replacement status, left     Plan: Recommended continue with strengthening and scar massage.  Continue work on range of motion.  Follow-Up Instructions: Return if symptoms worsen or fail to improve.   Ortho Exam  Patient is alert, oriented, no adenopathy, well-dressed, normal affect, normal respiratory effort. Examination patient has range of motion from 0 to 100 degrees of the left knee.  There is some slight thickening of the scar and recommended scar massage.  Imaging: No results found. No images are attached to the encounter.  Labs: Lab Results  Component Value Date   HGBA1C 7.7 (H) 08/24/2022   HGBA1C 7.6 (H) 11/02/2021   HGBA1C 8.6 07/22/2009     Lab Results  Component Value Date   ALBUMIN 3.6 10/16/2019   ALBUMIN 4.2 07/08/2009    No results found for: "MG" No results found for: "VD25OH"  No results found for: "PREALBUMIN"    Latest Ref Rng & Units 08/24/2022    1:30 PM 11/02/2021   10:18 AM 10/16/2019    4:33 PM  CBC EXTENDED  WBC 4.0 - 10.5 K/uL 8.4  9.3  7.2   RBC 3.87 - 5.11 MIL/uL 4.50  4.21  4.66   Hemoglobin 12.0 - 15.0 g/dL  35.0  09.3  81.8   HCT 36.0 - 46.0 % 38.4  36.5  39.3   Platelets 150 - 400 K/uL 334  312  286   NEUT# 1.7 - 7.7 K/uL   4.2   Lymph# 0.7 - 4.0 K/uL   2.3      Body mass index is 53.56 kg/m.  Orders:  No orders of the defined types were placed in this encounter.  No orders of the defined types were placed in this encounter.    Procedures: No procedures performed  Clinical Data: No additional findings.  ROS:  All other systems negative, except as noted in the HPI. Review of Systems  Objective: Vital Signs: Ht 5\' 7"  (1.702 m)   Wt (!) 342 lb (155.1 kg)   BMI 53.56 kg/m   Specialty Comments:  No specialty comments available.  PMFS History: Patient Active Problem List   Diagnosis Date Noted   DJD (degenerative joint disease) of knee 08/31/2022   Total knee replacement status, left 08/31/2022   Total knee replacement status, right 11/05/2021   Unilateral primary osteoarthritis, right knee    Medial meniscus, posterior horn derangement, right  De Quervain's syndrome (tenosynovitis)    DIABETES MELLITUS, UNCONTROLLED 07/22/2009   ANKLE, ARTHRITIS, DEGEN./OSTEO 08/21/2008   ADJUSTMENT DISORDER WITH DEPRESSED MOOD 08/13/2008   ALLERGIC RHINITIS 05/15/2008   LEG PAIN, CHRONIC 10/05/2007   LEUKOCYTOSIS 04/05/2007   SORE THROAT 04/05/2007   SARCOIDOSIS 01/29/2007   OBESITY, MORBID 01/29/2007   OBSTRUCTIVE SLEEP APNEA 01/29/2007   MIGRAINE HEADACHE 01/29/2007   HYPERTENSION 01/29/2007   OSTEOARTHRITIS 01/29/2007   Past Medical History:  Diagnosis Date   Arthritis    Diabetes mellitus without complication (HCC)    GERD (gastroesophageal reflux disease)    Hypertension    Sarcoidosis    Sleep apnea     History reviewed. No pertinent family history.  Past Surgical History:  Procedure Laterality Date   ABLATION  1993   CHOLECYSTECTOMY     15 years ago [facility]   DORSAL COMPARTMENT RELEASE Left 07/10/2015   Procedure: RELEASE DORSAL COMPARTMENT (DEQUERVAIN);   Surgeon: Vickki Hearing, MD;  Location: [facility];  Service: Orthopedics;  Laterality: Left;   KNEE ARTHROSCOPY WITH MEDIAL MENISECTOMY Right 10/20/2016   Procedure: KNEE ARTHROSCOPY WITH MEDIAL MENISECTOMY;  Surgeon: Vickki Hearing, MD;  Location: [facility];  Service: Orthopedics;  Laterality: Right;   ORIF ANKLE FRACTURE Right    TOTAL KNEE ARTHROPLASTY Right 11/05/2021   Procedure: RIGHT TOTAL KNEE ARTHROPLASTY;  Surgeon: Nadara Mustard, MD;  Location: [facility] OR;  Service: Orthopedics;  Laterality: Right;   TOTAL KNEE ARTHROPLASTY Left 08/31/2022   Procedure: LEFT TOTAL KNEE ARTHROPLASTY;  Surgeon: Nadara Mustard, MD;  Location: [facility] OR;  Service: Orthopedics;  Laterality: Left;   Social History   Occupational History   Not on file  Tobacco Use   Smoking status: Former    Packs/day: 0.50    Years: 5.00    Total pack years: 2.50    Types: Cigarettes    Quit date: 07/05/1994    Years since quitting: 28.4   Smokeless tobacco: Never  Vaping Use   Vaping Use: Never used  Substance and Sexual Activity   Alcohol use: No   Drug use: No    Types: Other-see comments    Comment: delta 9 gummies every night for sleep   Sexual activity: Yes    Birth control/protection: Surgical

## 2022-12-28 ENCOUNTER — Other Ambulatory Visit (HOSPITAL_COMMUNITY): Payer: Self-pay | Admitting: Adult Health Nurse Practitioner

## 2022-12-28 ENCOUNTER — Encounter (HOSPITAL_COMMUNITY): Payer: Self-pay | Admitting: Adult Health Nurse Practitioner

## 2022-12-28 DIAGNOSIS — Z1231 Encounter for screening mammogram for malignant neoplasm of breast: Secondary | ICD-10-CM

## 2023-01-05 ENCOUNTER — Ambulatory Visit (HOSPITAL_COMMUNITY): Payer: Medicare Other

## 2023-01-05 ENCOUNTER — Ambulatory Visit (HOSPITAL_COMMUNITY)
Admission: RE | Admit: 2023-01-05 | Discharge: 2023-01-05 | Disposition: A | Discharge status: Home / Self Care | Payer: Medicare HMO | Source: Ambulatory Visit | Attending: Adult Health Nurse Practitioner | Admitting: Adult Health Nurse Practitioner

## 2023-01-05 DIAGNOSIS — Z1231 Encounter for screening mammogram for malignant neoplasm of breast: Secondary | ICD-10-CM | POA: Diagnosis present

## 2023-02-23 ENCOUNTER — Encounter: Payer: Self-pay | Admitting: Radiology
